# Patient Record
Sex: Female | Born: 1935 | Race: White | Hispanic: No | Smoking: Former smoker
Health system: Southern US, Community
[De-identification: ages and names within clinical notes are randomized; demographics above are authoritative.]

## PROBLEM LIST (undated history)

## (undated) DIAGNOSIS — F419 Anxiety disorder, unspecified: Secondary | ICD-10-CM

## (undated) DIAGNOSIS — I10 Essential (primary) hypertension: Secondary | ICD-10-CM

## (undated) DIAGNOSIS — E039 Hypothyroidism, unspecified: Secondary | ICD-10-CM

## (undated) DIAGNOSIS — T07XXXA Unspecified multiple injuries, initial encounter: Secondary | ICD-10-CM

## (undated) DIAGNOSIS — K5792 Diverticulitis of intestine, part unspecified, without perforation or abscess without bleeding: Secondary | ICD-10-CM

## (undated) DIAGNOSIS — T148XXA Other injury of unspecified body region, initial encounter: Secondary | ICD-10-CM

## (undated) DIAGNOSIS — R110 Nausea: Secondary | ICD-10-CM

## (undated) DIAGNOSIS — G8929 Other chronic pain: Secondary | ICD-10-CM

## (undated) DIAGNOSIS — J449 Chronic obstructive pulmonary disease, unspecified: Secondary | ICD-10-CM

## (undated) DIAGNOSIS — M549 Dorsalgia, unspecified: Secondary | ICD-10-CM

## (undated) DIAGNOSIS — C801 Malignant (primary) neoplasm, unspecified: Secondary | ICD-10-CM

## (undated) DIAGNOSIS — C3490 Malignant neoplasm of unspecified part of unspecified bronchus or lung: Secondary | ICD-10-CM

## (undated) DIAGNOSIS — R51 Headache: Secondary | ICD-10-CM

## (undated) DIAGNOSIS — J45909 Unspecified asthma, uncomplicated: Secondary | ICD-10-CM

## (undated) HISTORY — DX: Malignant neoplasm of unspecified part of unspecified bronchus or lung: C34.90

## (undated) HISTORY — DX: Diverticulitis of intestine, part unspecified, without perforation or abscess without bleeding: K57.92

## (undated) HISTORY — DX: Other chronic pain: G89.29

## (undated) HISTORY — PX: ABDOMINAL HYSTERECTOMY: SHX81

## (undated) HISTORY — DX: Dorsalgia, unspecified: M54.9

---

## 1999-03-30 ENCOUNTER — Ambulatory Visit (HOSPITAL_COMMUNITY): Admission: RE | Admit: 1999-03-30 | Discharge: 1999-03-30 | Payer: Self-pay | Admitting: Orthopedic Surgery

## 1999-03-30 ENCOUNTER — Encounter: Payer: Self-pay | Admitting: Orthopedic Surgery

## 1999-06-19 ENCOUNTER — Emergency Department (HOSPITAL_COMMUNITY): Admission: EM | Admit: 1999-06-19 | Discharge: 1999-06-19 | Payer: Self-pay | Admitting: *Deleted

## 1999-07-15 ENCOUNTER — Ambulatory Visit (HOSPITAL_COMMUNITY): Admission: RE | Admit: 1999-07-15 | Discharge: 1999-07-15 | Payer: Self-pay | Admitting: Sports Medicine

## 1999-07-15 ENCOUNTER — Encounter: Payer: Self-pay | Admitting: Sports Medicine

## 1999-07-16 ENCOUNTER — Encounter: Payer: Self-pay | Admitting: Diagnostic Radiology

## 1999-07-16 ENCOUNTER — Ambulatory Visit (HOSPITAL_COMMUNITY): Admission: RE | Admit: 1999-07-16 | Discharge: 1999-07-16 | Payer: Self-pay | Admitting: Sports Medicine

## 1999-10-06 ENCOUNTER — Ambulatory Visit: Admission: RE | Admit: 1999-10-06 | Discharge: 1999-10-06 | Payer: Self-pay | Admitting: *Deleted

## 2000-06-29 ENCOUNTER — Encounter: Payer: Self-pay | Admitting: Internal Medicine

## 2000-06-29 ENCOUNTER — Encounter: Admission: RE | Admit: 2000-06-29 | Discharge: 2000-06-29 | Payer: Self-pay | Admitting: Internal Medicine

## 2000-10-28 ENCOUNTER — Ambulatory Visit (HOSPITAL_COMMUNITY): Admission: RE | Admit: 2000-10-28 | Discharge: 2000-10-28 | Payer: Self-pay | Admitting: Specialist

## 2000-10-28 ENCOUNTER — Encounter: Payer: Self-pay | Admitting: Specialist

## 2001-09-17 ENCOUNTER — Encounter: Admission: RE | Admit: 2001-09-17 | Discharge: 2001-12-16 | Payer: Self-pay | Admitting: Internal Medicine

## 2002-03-30 ENCOUNTER — Emergency Department (HOSPITAL_COMMUNITY): Admission: EM | Admit: 2002-03-30 | Discharge: 2002-03-30 | Payer: Self-pay | Admitting: Emergency Medicine

## 2002-03-30 ENCOUNTER — Encounter: Payer: Self-pay | Admitting: Emergency Medicine

## 2002-03-30 ENCOUNTER — Encounter: Payer: Self-pay | Admitting: Orthopedic Surgery

## 2004-04-17 ENCOUNTER — Emergency Department (HOSPITAL_COMMUNITY): Admission: EM | Admit: 2004-04-17 | Discharge: 2004-04-17 | Payer: Self-pay | Admitting: Emergency Medicine

## 2005-10-11 ENCOUNTER — Inpatient Hospital Stay (HOSPITAL_COMMUNITY): Admission: RE | Admit: 2005-10-11 | Discharge: 2005-10-15 | Payer: Self-pay | Admitting: Orthopedic Surgery

## 2005-10-24 ENCOUNTER — Ambulatory Visit (HOSPITAL_COMMUNITY): Admission: RE | Admit: 2005-10-24 | Discharge: 2005-10-24 | Payer: Self-pay | Admitting: Orthopedic Surgery

## 2005-11-02 ENCOUNTER — Encounter: Admission: RE | Admit: 2005-11-02 | Discharge: 2006-01-31 | Payer: Self-pay | Admitting: Orthopedic Surgery

## 2006-01-09 ENCOUNTER — Ambulatory Visit (HOSPITAL_COMMUNITY): Admission: RE | Admit: 2006-01-09 | Discharge: 2006-01-09 | Payer: Self-pay | Admitting: Family Medicine

## 2006-01-09 ENCOUNTER — Emergency Department (HOSPITAL_COMMUNITY): Admission: EM | Admit: 2006-01-09 | Discharge: 2006-01-09 | Payer: Self-pay | Admitting: Family Medicine

## 2006-02-07 ENCOUNTER — Inpatient Hospital Stay (HOSPITAL_COMMUNITY): Admission: RE | Admit: 2006-02-07 | Discharge: 2006-02-08 | Payer: Self-pay | Admitting: Orthopedic Surgery

## 2006-04-19 ENCOUNTER — Encounter: Payer: Self-pay | Admitting: *Deleted

## 2006-09-24 ENCOUNTER — Encounter: Admission: RE | Admit: 2006-09-24 | Discharge: 2006-09-24 | Payer: Self-pay

## 2006-10-24 ENCOUNTER — Encounter: Admission: RE | Admit: 2006-10-24 | Discharge: 2006-10-24 | Payer: Self-pay

## 2006-11-20 ENCOUNTER — Encounter: Admission: RE | Admit: 2006-11-20 | Discharge: 2006-11-20 | Payer: Self-pay

## 2006-12-07 ENCOUNTER — Encounter: Admission: RE | Admit: 2006-12-07 | Discharge: 2006-12-07 | Payer: Self-pay

## 2007-05-22 ENCOUNTER — Encounter: Admission: RE | Admit: 2007-05-22 | Discharge: 2007-05-22 | Payer: Self-pay | Admitting: Orthopedic Surgery

## 2007-06-11 ENCOUNTER — Encounter: Admission: RE | Admit: 2007-06-11 | Discharge: 2007-06-11 | Payer: Self-pay | Admitting: Orthopedic Surgery

## 2007-07-02 ENCOUNTER — Encounter: Admission: RE | Admit: 2007-07-02 | Discharge: 2007-07-02 | Payer: Self-pay | Admitting: Orthopedic Surgery

## 2007-07-10 ENCOUNTER — Emergency Department (HOSPITAL_COMMUNITY): Admission: EM | Admit: 2007-07-10 | Discharge: 2007-07-10 | Payer: Self-pay | Admitting: Emergency Medicine

## 2007-09-08 ENCOUNTER — Encounter: Admission: RE | Admit: 2007-09-08 | Discharge: 2007-09-08 | Payer: Self-pay | Admitting: Orthopedic Surgery

## 2007-09-10 ENCOUNTER — Encounter: Admission: RE | Admit: 2007-09-10 | Discharge: 2007-09-10 | Payer: Self-pay | Admitting: Orthopedic Surgery

## 2007-09-20 ENCOUNTER — Ambulatory Visit (HOSPITAL_COMMUNITY): Admission: RE | Admit: 2007-09-20 | Discharge: 2007-09-20 | Payer: Self-pay | Admitting: Radiology

## 2007-09-27 ENCOUNTER — Encounter: Admission: RE | Admit: 2007-09-27 | Discharge: 2007-09-27 | Payer: Self-pay | Admitting: Interventional Radiology

## 2007-09-27 ENCOUNTER — Encounter: Admission: RE | Admit: 2007-09-27 | Discharge: 2007-09-27 | Payer: Self-pay | Admitting: Radiology

## 2007-12-29 ENCOUNTER — Ambulatory Visit: Payer: Self-pay | Admitting: Critical Care Medicine

## 2007-12-29 ENCOUNTER — Inpatient Hospital Stay (HOSPITAL_COMMUNITY): Admission: EM | Admit: 2007-12-29 | Discharge: 2008-01-02 | Payer: Self-pay | Admitting: Emergency Medicine

## 2008-02-28 ENCOUNTER — Encounter: Admission: RE | Admit: 2008-02-28 | Discharge: 2008-02-28 | Payer: Self-pay | Admitting: Orthopedic Surgery

## 2008-08-23 ENCOUNTER — Observation Stay (HOSPITAL_COMMUNITY): Admission: EM | Admit: 2008-08-23 | Discharge: 2008-08-24 | Payer: Self-pay | Admitting: Emergency Medicine

## 2008-09-29 IMAGING — CR DG CHEST 1V
1 series · 1 of 1 positions shown · non-contrast
Comparison: 12/31/2007

CLINICAL DATA: Altered level of consciousness.

CHEST - 1 VIEW

[view not recorded]
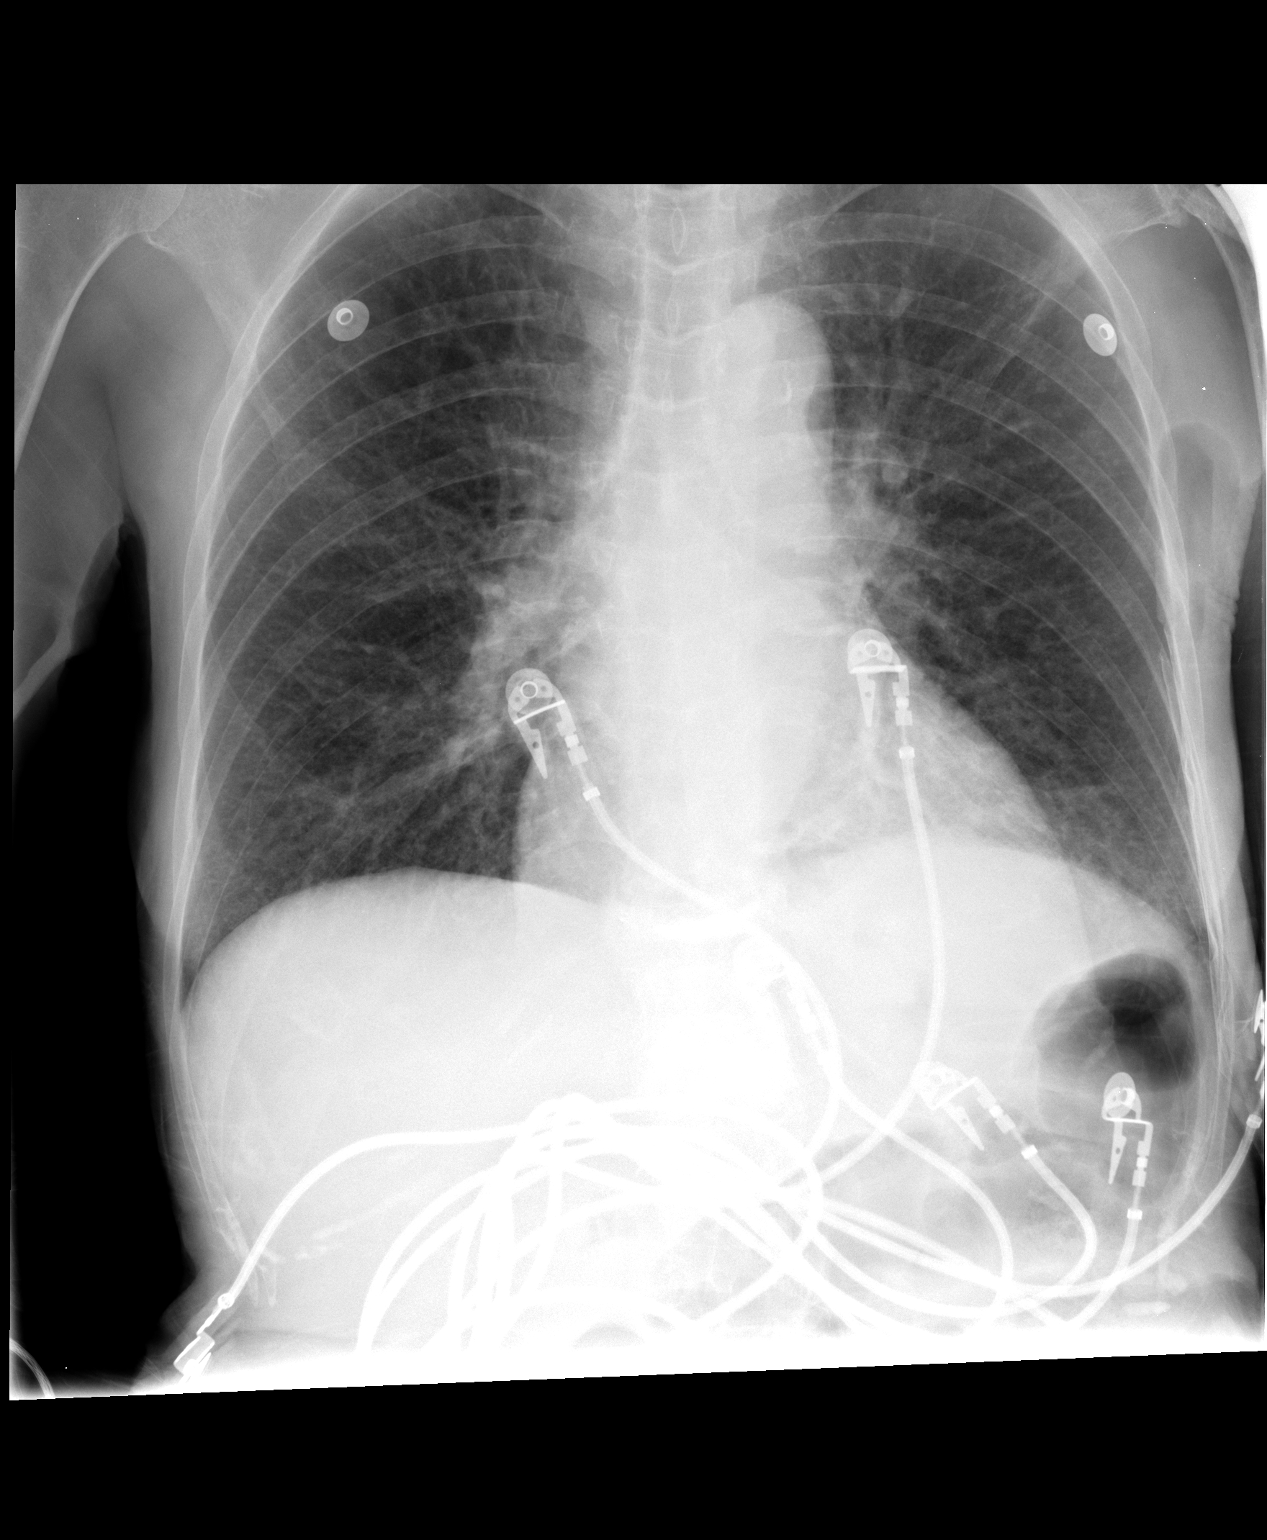

[1 of 1 positions shown; findings below may reference images not displayed]

FINDINGS: Upper limits normal heart size identified.
Mild interstitial prominence is unchanged.
Mild left basilar scarring is again noted.
There is no evidence of focal airspace disease, pleural effusions,
pneumothorax.
A left humeral prosthesis is identified.
Evidence of vertebroplasty again noted.
IMPRESSION: No evidence of acute cardiopulmonary disease.

## 2010-01-04 ENCOUNTER — Encounter: Payer: Self-pay | Admitting: Internal Medicine

## 2010-02-03 ENCOUNTER — Encounter: Payer: Self-pay | Admitting: Internal Medicine

## 2010-02-08 ENCOUNTER — Encounter: Admission: RE | Admit: 2010-02-08 | Discharge: 2010-02-08 | Payer: Self-pay | Admitting: Gastroenterology

## 2010-02-11 ENCOUNTER — Encounter: Payer: Self-pay | Admitting: Internal Medicine

## 2010-02-15 ENCOUNTER — Encounter: Admission: RE | Admit: 2010-02-15 | Discharge: 2010-02-15 | Payer: Self-pay | Admitting: Gastroenterology

## 2010-03-03 ENCOUNTER — Ambulatory Visit (HOSPITAL_COMMUNITY): Admission: RE | Admit: 2010-03-03 | Discharge: 2010-03-03 | Payer: Self-pay | Admitting: Gastroenterology

## 2010-03-16 ENCOUNTER — Encounter: Admission: RE | Admit: 2010-03-16 | Discharge: 2010-03-16 | Payer: Self-pay | Admitting: Gastroenterology

## 2010-03-25 ENCOUNTER — Ambulatory Visit: Payer: Self-pay | Admitting: Internal Medicine

## 2010-03-25 DIAGNOSIS — J45909 Unspecified asthma, uncomplicated: Secondary | ICD-10-CM | POA: Insufficient documentation

## 2010-03-25 DIAGNOSIS — J984 Other disorders of lung: Secondary | ICD-10-CM

## 2010-03-25 DIAGNOSIS — J441 Chronic obstructive pulmonary disease with (acute) exacerbation: Secondary | ICD-10-CM

## 2010-03-25 DIAGNOSIS — I10 Essential (primary) hypertension: Secondary | ICD-10-CM | POA: Insufficient documentation

## 2010-03-25 DIAGNOSIS — E785 Hyperlipidemia, unspecified: Secondary | ICD-10-CM

## 2010-03-25 DIAGNOSIS — F17200 Nicotine dependence, unspecified, uncomplicated: Secondary | ICD-10-CM

## 2010-03-25 DIAGNOSIS — R079 Chest pain, unspecified: Secondary | ICD-10-CM | POA: Insufficient documentation

## 2010-03-25 DIAGNOSIS — J4489 Other specified chronic obstructive pulmonary disease: Secondary | ICD-10-CM | POA: Insufficient documentation

## 2010-03-25 DIAGNOSIS — M1990 Unspecified osteoarthritis, unspecified site: Secondary | ICD-10-CM | POA: Insufficient documentation

## 2010-03-25 DIAGNOSIS — J309 Allergic rhinitis, unspecified: Secondary | ICD-10-CM | POA: Insufficient documentation

## 2010-03-25 DIAGNOSIS — F329 Major depressive disorder, single episode, unspecified: Secondary | ICD-10-CM

## 2010-03-25 DIAGNOSIS — J449 Chronic obstructive pulmonary disease, unspecified: Secondary | ICD-10-CM

## 2010-03-26 ENCOUNTER — Emergency Department (HOSPITAL_COMMUNITY): Admission: EM | Admit: 2010-03-26 | Discharge: 2010-03-26 | Payer: Self-pay | Admitting: Emergency Medicine

## 2010-04-02 ENCOUNTER — Telehealth: Payer: Self-pay | Admitting: Internal Medicine

## 2010-04-07 ENCOUNTER — Telehealth (INDEPENDENT_AMBULATORY_CARE_PROVIDER_SITE_OTHER): Payer: Self-pay | Admitting: *Deleted

## 2010-04-12 ENCOUNTER — Ambulatory Visit (HOSPITAL_COMMUNITY): Admission: RE | Admit: 2010-04-12 | Discharge: 2010-04-12 | Payer: Self-pay | Admitting: Internal Medicine

## 2010-04-19 ENCOUNTER — Telehealth: Payer: Self-pay | Admitting: Internal Medicine

## 2010-04-27 ENCOUNTER — Ambulatory Visit: Payer: Self-pay | Admitting: Internal Medicine

## 2010-04-27 DIAGNOSIS — E049 Nontoxic goiter, unspecified: Secondary | ICD-10-CM | POA: Insufficient documentation

## 2010-04-27 DIAGNOSIS — M810 Age-related osteoporosis without current pathological fracture: Secondary | ICD-10-CM | POA: Insufficient documentation

## 2010-05-02 IMAGING — CR DG CHEST 2V
2 series · 2 of 2 positions shown · non-contrast
Comparison: Chest CT 03/16/2010

CLINICAL DATA: Shortness of breath.  The patient heard pop in chest
today near sternum.  Diagnosed 1 week ago with pneumonia.  Pain.

CHEST - 2 VIEW

[w chest pa]
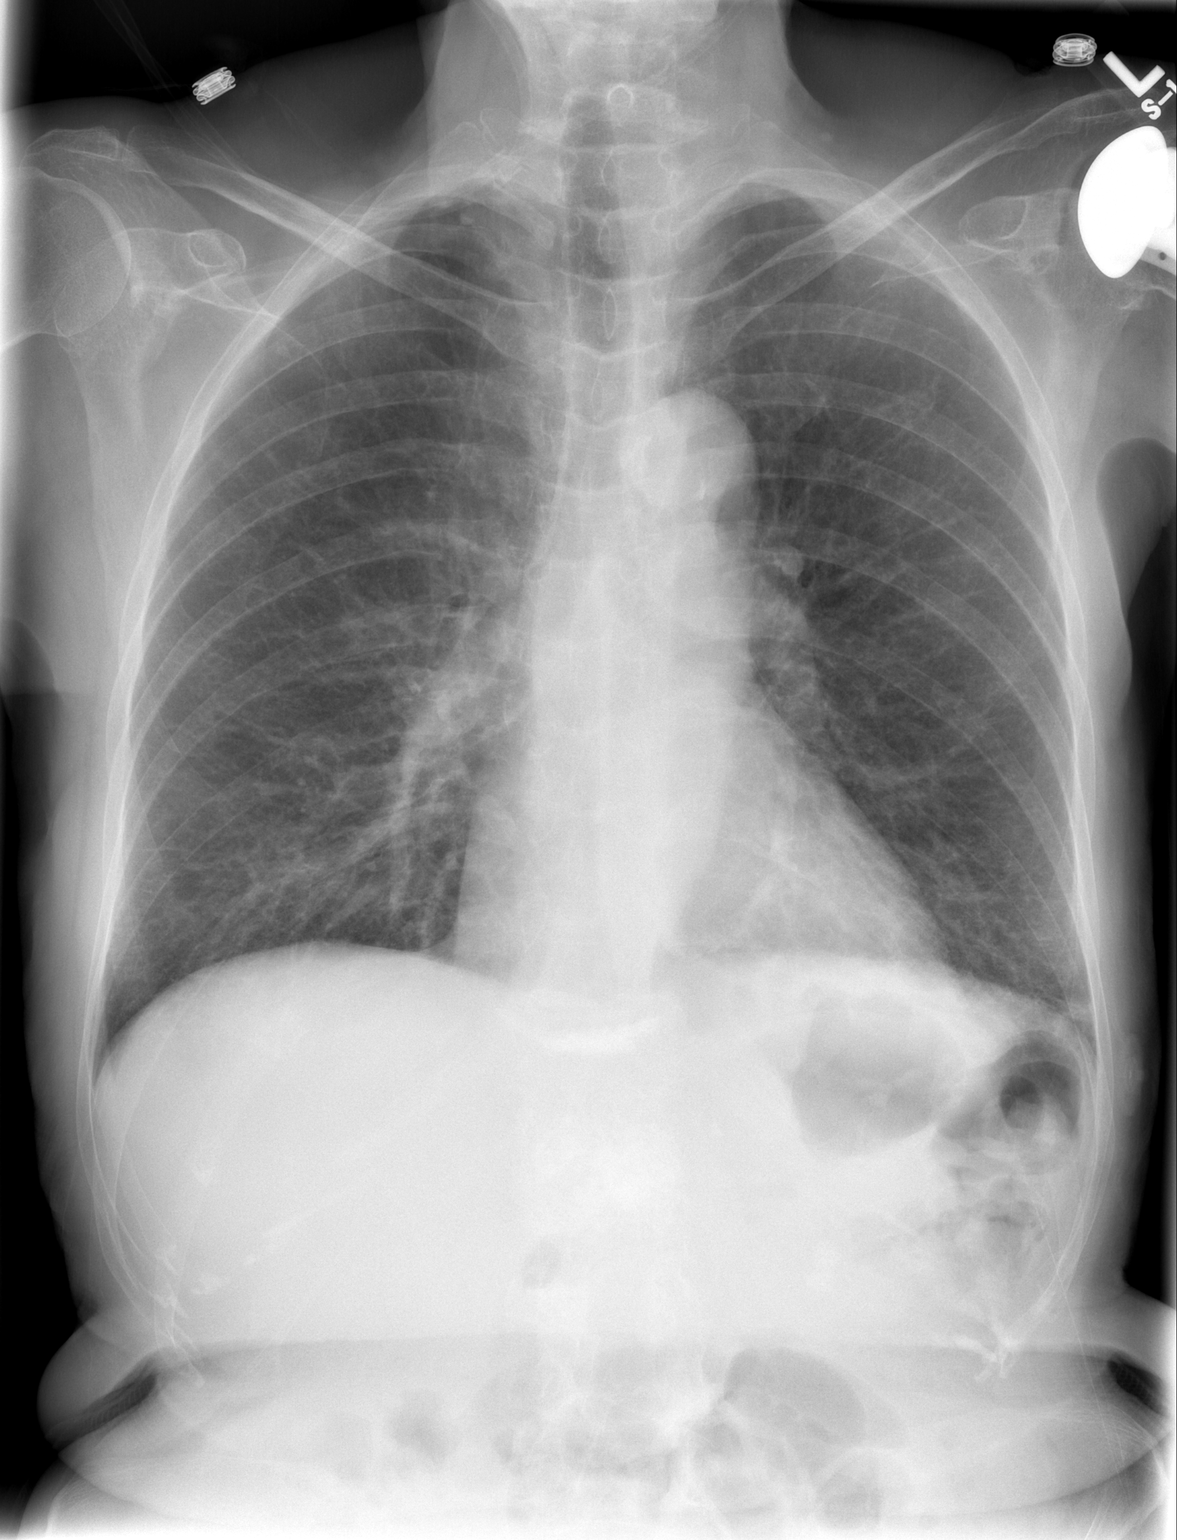

[w chest lat]
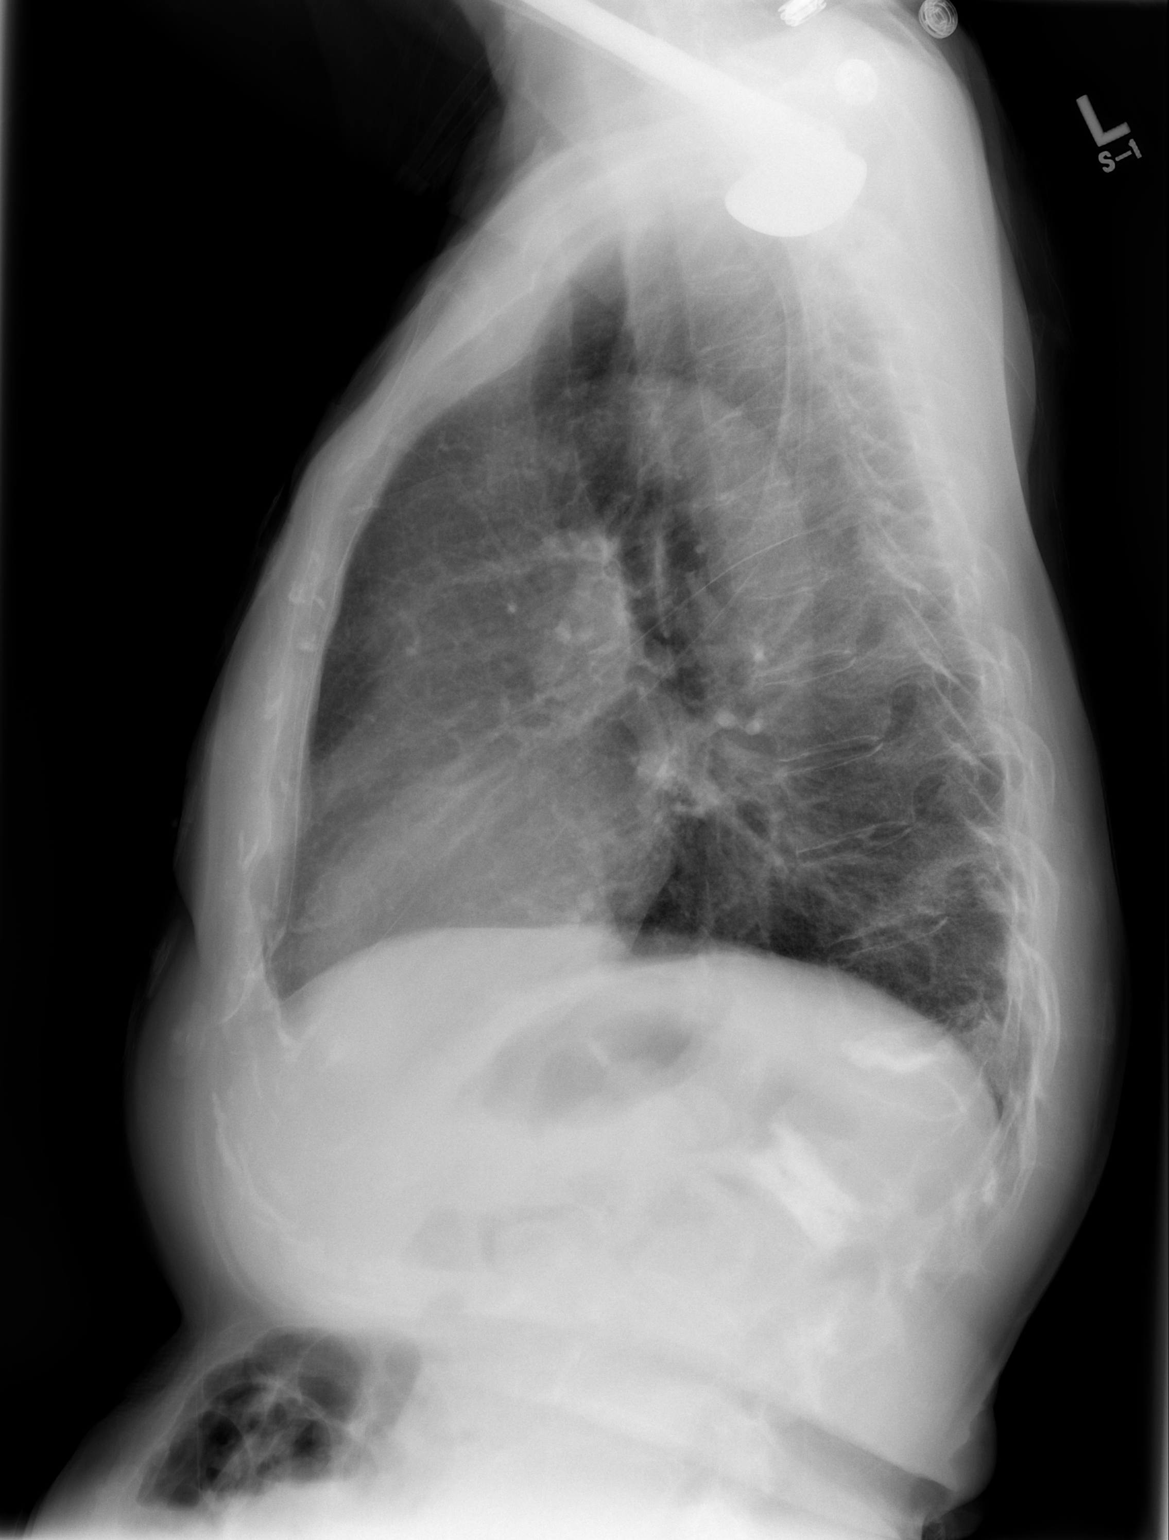

[2 of 2 positions shown; findings below may reference images not displayed]

FINDINGS: Heart size is normal.  There are perihilar bronchitic
changes.  Ground-glass nodules seen on recent CT is not as well
seen on plain films.  There is minimal patchy density seen at the
left lung base however.  Patient has had numerous prior
vertebroplasties.

On the lateral view, there is a lucency traversing the upper
sternal body.  A nondisplaced sternal fracture cannot be entirely
excluded.  Patient has had prior left shoulder arthroplasty.
IMPRESSION: 1.  Minimal patchy density at the left lung base.  The patient has
ground-glass nodules within the left lung base on recent CT.
Further evaluation has been recommended.
2.  Cannot entirely exclude a nondisplaced fracture.  See above.

## 2010-06-30 ENCOUNTER — Telehealth (INDEPENDENT_AMBULATORY_CARE_PROVIDER_SITE_OTHER): Payer: Self-pay | Admitting: *Deleted

## 2010-07-19 ENCOUNTER — Ambulatory Visit: Payer: Self-pay | Admitting: Internal Medicine

## 2010-08-03 ENCOUNTER — Ambulatory Visit: Payer: Self-pay | Admitting: Internal Medicine

## 2010-08-05 LAB — CONVERTED CEMR LAB: Vit D, 25-Hydroxy: 29 ng/mL — ABNORMAL LOW (ref 30–89)

## 2010-08-13 ENCOUNTER — Telehealth: Payer: Self-pay | Admitting: Internal Medicine

## 2010-08-20 ENCOUNTER — Ambulatory Visit: Payer: Self-pay | Admitting: Emergency Medicine

## 2010-08-20 ENCOUNTER — Observation Stay (HOSPITAL_COMMUNITY): Admission: RE | Admit: 2010-08-20 | Discharge: 2010-08-21 | Payer: Self-pay | Admitting: Emergency Medicine

## 2010-08-20 ENCOUNTER — Encounter (INDEPENDENT_AMBULATORY_CARE_PROVIDER_SITE_OTHER): Payer: Self-pay | Admitting: Pulmonary Disease

## 2010-08-24 ENCOUNTER — Ambulatory Visit: Payer: Self-pay | Admitting: Internal Medicine

## 2010-08-24 DIAGNOSIS — C3490 Malignant neoplasm of unspecified part of unspecified bronchus or lung: Secondary | ICD-10-CM | POA: Insufficient documentation

## 2010-08-25 ENCOUNTER — Telehealth: Payer: Self-pay | Admitting: Internal Medicine

## 2010-08-25 ENCOUNTER — Ambulatory Visit: Payer: Self-pay | Admitting: Internal Medicine

## 2010-08-26 ENCOUNTER — Encounter: Payer: Self-pay | Admitting: Internal Medicine

## 2010-08-31 ENCOUNTER — Telehealth (INDEPENDENT_AMBULATORY_CARE_PROVIDER_SITE_OTHER): Payer: Self-pay | Admitting: *Deleted

## 2010-09-01 ENCOUNTER — Ambulatory Visit (HOSPITAL_COMMUNITY): Admission: RE | Admit: 2010-09-01 | Discharge: 2010-09-01 | Payer: Self-pay | Admitting: Emergency Medicine

## 2010-09-08 ENCOUNTER — Ambulatory Visit
Admission: RE | Admit: 2010-09-08 | Discharge: 2010-09-30 | Payer: Self-pay | Source: Home / Self Care | Admitting: Radiation Oncology

## 2010-09-29 ENCOUNTER — Encounter: Payer: Self-pay | Admitting: Internal Medicine

## 2010-10-07 IMAGING — CR DG CHEST 2V
2 series · 2 of 2 positions shown · non-contrast
Comparison: Chest x-ray 08/20/2010, chest c t 07/19/2010

CLINICAL DATA: Lung cancer.  Pre admit for O R 09/01/2010.  History
of asthma, emphysema, COPD, smoking.  History of hypertension.

CHEST - 2 VIEW

[view not recorded (1 of 2)]
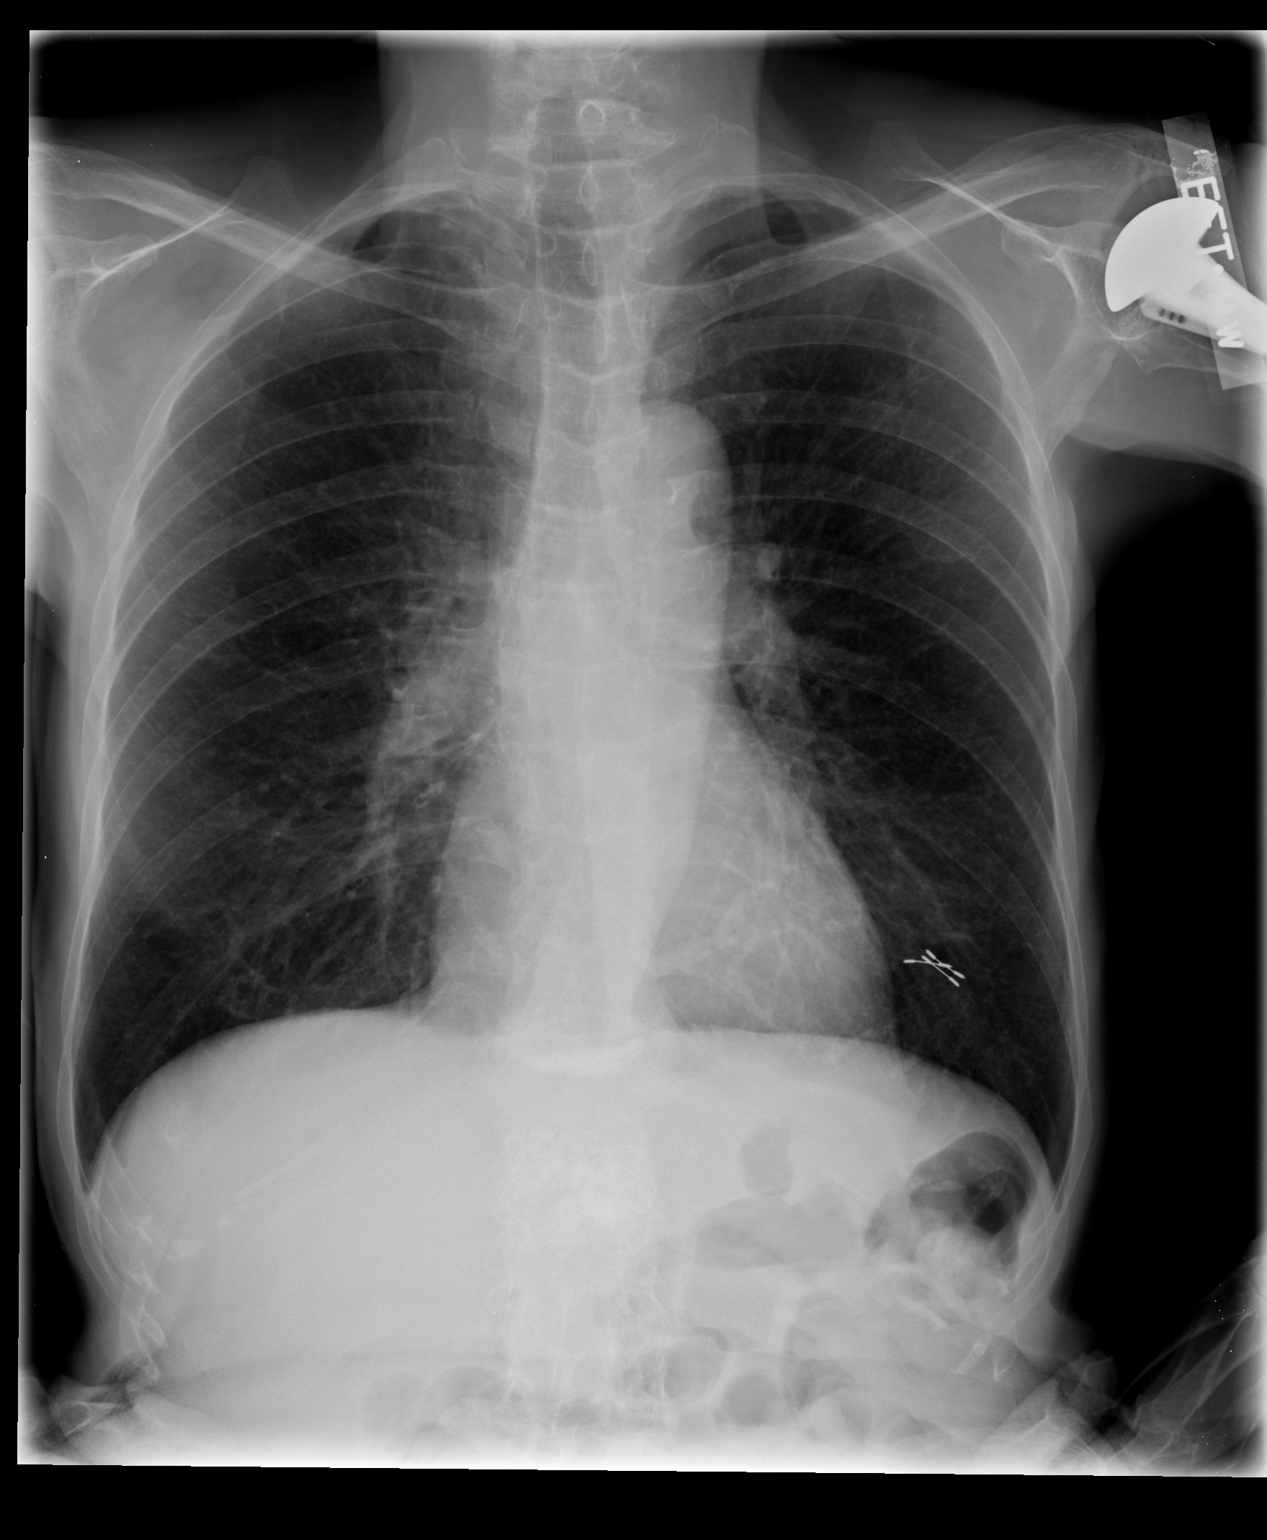

[view not recorded (2 of 2)]
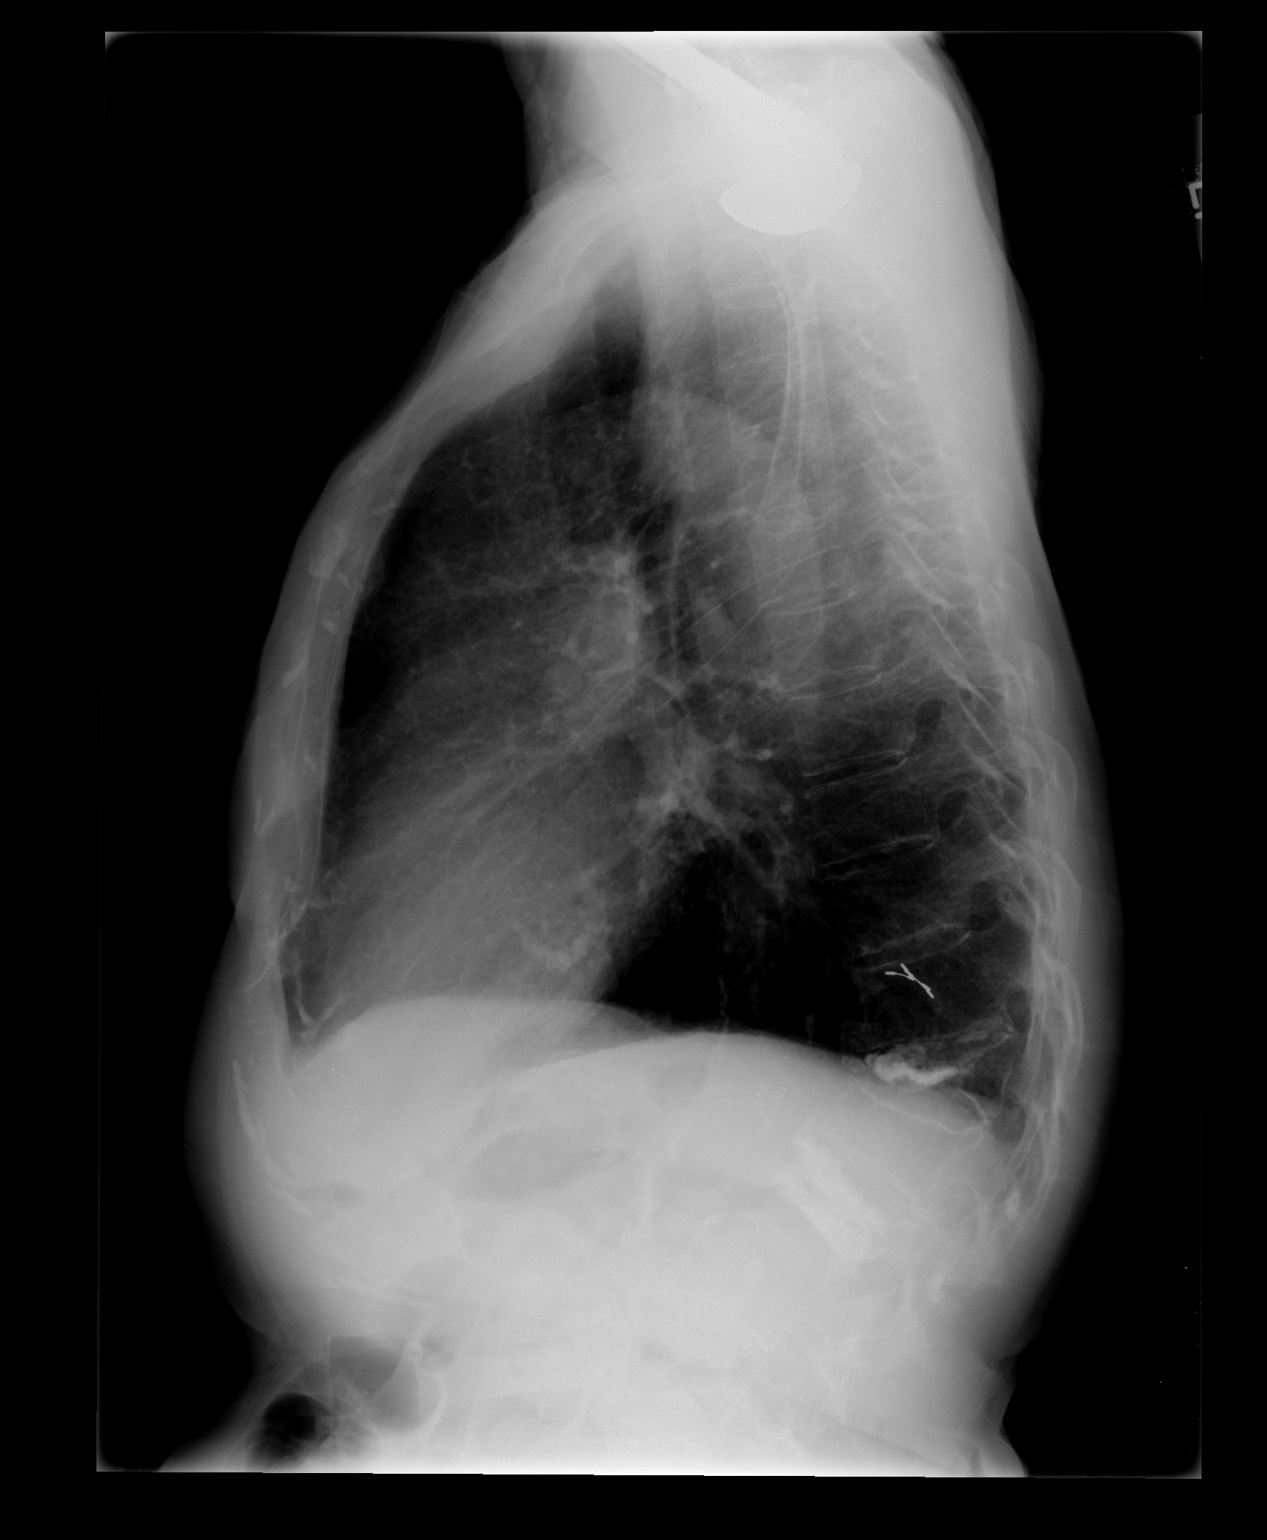

[2 of 2 positions shown; findings below may reference images not displayed]

FINDINGS: Heart size is normal.  The lungs are free of focal
consolidations and pleural effusions.  There are perihilar
bronchitic changes.  Lungs are hyperinflated as before.  Overlying
the posterior left lower lobe, there are three small radiopaque
markers.  Patient has had prior 2 level vertebral plasty in the
lower thoracic and lumbar spine. No evidence for pneumothorax or
pulmonary edema.  No new consolidation. Prior left shoulder
arthroplasty.
IMPRESSION: COPD and bronchitic changes.  No acute findings.

## 2010-11-05 ENCOUNTER — Encounter: Payer: Self-pay | Admitting: Internal Medicine

## 2011-01-06 LAB — CREATININE, SERUM: Creatinine, Ser: 0.6 mg/dL (ref 0.40–1.20)

## 2011-01-06 LAB — BUN: BUN: 14 mg/dL (ref 6–23)

## 2011-01-07 ENCOUNTER — Ambulatory Visit (HOSPITAL_COMMUNITY)
Admission: RE | Admit: 2011-01-07 | Discharge: 2011-01-07 | Payer: Self-pay | Source: Home / Self Care | Attending: Radiation Oncology | Admitting: Radiation Oncology

## 2011-01-09 ENCOUNTER — Encounter: Payer: Self-pay | Admitting: Radiology

## 2011-01-09 ENCOUNTER — Encounter: Payer: Self-pay | Admitting: Gastroenterology

## 2011-01-14 ENCOUNTER — Encounter: Payer: Self-pay | Admitting: Internal Medicine

## 2011-01-14 ENCOUNTER — Other Ambulatory Visit: Payer: Self-pay | Admitting: Radiation Oncology

## 2011-01-14 DIAGNOSIS — C3490 Malignant neoplasm of unspecified part of unspecified bronchus or lung: Secondary | ICD-10-CM

## 2011-01-18 NOTE — Assessment & Plan Note (Signed)
Summary: per MR follow up with spiro/MS   Visit Type:  Follow-up Copy to:  Dr. Evette Cristal Primary Provider/Referring Provider:  Dr. Creta Levin, [place] and Dr. Wandalee Ferdinand [facility]  CC:  Pt here for 3 month follow-up. Pt denies any new complaints. States breathing is well. Marland Kitchen  History of Present Illness: 77 year old wiht severe osteoporosis, idiopathic cyclical nausea/vomitting. Pulmonary issues include Smoking, COPD NOS,  and pulmonary nodules LUL and LLL  on CT 03/16/2010   OV 08/03/2010: Followup for her pumonary issues. Since lsat visit in May 2011, she is having  diverticulitis flare currently and is on antibiotics. Therefore, has had significant weight loss. From pulmonary standpoint, she is still smoking but trying to quit. Plans to get nictoine patch tomorrow. COPD is stable and is compliant with spiriva. Spirometry done today shows Gold stage 2 COPD. In terms of nodule, she had CT chest earlier this month and nodules are stable  Preventive Screening-Counseling & Management  Alcohol-Tobacco     Smoking Status: current     Smoking Cessation Counseling: yes     Smoke Cessation Stage: ready     Tobacco Counseling: to quit use of tobacco products  Current Medications (verified): 1)  Spiriva Handihaler 18 Mcg Caps (Tiotropium Bromide Monohydrate) .... Inhale One Capsule Once Daily 2)  Proair Hfa 108 (90 Base) Mcg/act Aers (Albuterol Sulfate) .... As Needed 3)  Levothroid 50 Mcg Tabs (Levothyroxine Sodium) .... Take 1 Tablet By Mouth Once A Day 4)  Bisoprolol Fumarate 5 Mg Tabs (Bisoprolol Fumarate) .... Take One and Half Tablets Once Daily 5)  Zoloft 50 Mg Tabs (Sertraline Hcl) .... Take 1 Tablet By Mouth Once A Day 6)  Amlodipine Besylate 10 Mg Tabs (Amlodipine Besylate) .... Take 1 Tablet By Mouth Once A Day 7)  Lorazepam 0.5 Mg Tabs (Lorazepam) .... Take 1 Hour Before Pet Scan  Allergies (verified): 1)  ! Asa  Past History:  Past medical, surgical, family and social  histories (including risk factors) reviewed, and no changes noted (except as noted below).  Past Medical History: Reviewed history from 04/27/2010 and no changes required. Allergic Rhinitis Asthma/COPD NOS Hypertension Hyperlipidemia Depression Osteoarthritis SEvere osteoporosis Benign appearing adrenal adenoma - MRI ABD 02/15/2010 and MR 2009 unchanged and PET 04/12/2010 Altered mental status secondary to narcotics overdose      (unintentional). - Sept 2009 Chronic pain secondary to osteoporosis with compression  deformities Bilateral nephrolithiasis. Largest calculus is in the lower   pole of the left kidney measuring  1 cm  - CT 2/21/211 Severe kyphosis at T12..  Labs 02/03/10 by Dr. Evette Cristal: Creatinine-0.60, Hgb-15.0, HCT-45.8, BUN-12, AST-16, ALT-10  Past Surgical History: Reviewed history from 03/25/2010 and no changes required. Vertebroplasty x2 Sinus surgery Total Abdominal Hysterectomy Total Knee Arthroplasty Shoulder Surgery 2007 -  Avascular necrosis, left humeral head, with large loose body, status      post left shoulder hemiarthroplasty.  Stent placed I believe in the renal artery in 2002 by Dr. Madilyn Fireman.  Nasal surgery 1982.  Four surgeries on her right ear. 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  Lumbar vertebroplasty July 2000.  Family History: Reviewed history from 03/25/2010 and no changes required. Mother- heart disease, lung cancer, smoker Aunt-several had cancer and heart attacks  Social History: Reviewed history from 03/25/2010 and no changes required. Patient is a current smoker.  Pt started smoking  in 1961 avg 1/2 ppd.  Widowed Retired  Review of Systems  The patient denies shortness of breath  with activity, shortness of breath at rest, productive cough, non-productive cough, coughing up blood, chest pain, irregular heartbeats, acid heartburn, indigestion, loss of appetite, weight change, abdominal pain, difficulty swallowing, sore throat, tooth/dental  problems, headaches, nasal congestion/difficulty breathing through nose, sneezing, itching, ear ache, anxiety, depression, hand/feet swelling, joint stiffness or pain, rash, change in color of mucus, and fever.    Vital Signs:  Patient profile:   77 year old female Height:      60 inches Weight:      92.4 pounds O2 Sat:      98 % on Room air Temp:     97.8 degrees F oral Pulse rate:   63 / minute BP sitting:   102 / 70  (left arm) Cuff size:   large  Vitals Entered By: Carron Curie CMA (August 03, 2010 11:54 AM)  O2 Flow:  Room air CC: Pt here for 3 month follow-up. Pt denies any new complaints. States breathing is well.  Comments Medications reviewed with patient Carron Curie CMA  August 03, 2010 11:57 AM Daytime phone number verified with patient.    Physical Exam  General:  well developed, well nourished, in no acute distresscachectic.   Head:  normocephalic and atraumatic Eyes:  PERRLA/EOM intact; conjunctiva and sclera clear Ears:  TMs intact and clear with normal canals Nose:  no deformity, discharge, inflammation, or lesions Mouth:  no deformity or lesions Neck:  no masses, thyromegaly, or abnormal cervical nodes Chest Wall:  no deformities noted Lungs:  clear bilaterally to auscultation and percussion Heart:  regular rate and rhythm, S1, S2 without murmurs, rubs, gallops, or clicks Abdomen:  bowel sounds positive; abdomen soft and non-tender without masses, or organomegaly Msk:  no deformity or scoliosis noted with normal posture Pulses:  pulses normal Extremities:  no clubbing, cyanosis, edema, or deformity noted Neurologic:  CN II-XII grossly intact with normal reflexes, coordination, muscle strength and tone Skin:  intact without lesions or rashes Cervical Nodes:  no significant adenopathy Axillary Nodes:  no significant adenopathy Psych:  alert and cooperative; normal mood and affect; normal attention span and concentration   CT of  Chest  Procedure date:  07/19/2010  Findings:      IMPRESSION:   1.  Left upper lobe and left lower lobe ground-glass nodules are stable in size.  Findings remain highly worrisome for indolent bronchioloalveolar carcinoma. 2.  Peripheral and upper lobe predominant areas of ground-glass. Nonspecific interstitial pneumonia (NSIP) is considered. 3.  Right adrenal adenoma. 4.  Nonobstructing bilateral nephrolithiasis.   Read By:  Reyes Ivan.,  M.D.     Released By:  Reyes Ivan.,  M.D.  Comments:      independently reviwed  Pulmonary Function Test Date: 08/03/2010 11:57 AM Gender: Female  Pre-Spirometry FVC    Value: 2.04 L/min   % Pred: 88 % FEV1    Value: 1.33 L     Pred: 1.73 L     % Pred: 77.20 % FEV1/FVC  Value: 65.35 %     % Pred: 87.30 %  Evaluation: moderate obstruction with NO significant bronchodilator response  Impression & Recommendations:  Problem # 1:  CHEST PAIN (ICD-786.50) Assessment Improved resolved. Was related to spontaneous sternal frcture  plan expectant folllowup  Problem # 2:  C O P D (ICD-496) Assessment: Unchanged Spirometry shows Gold stage 2 COPD. Currently stable. Advised to continue spiriva 1  puff daily. Will check vitamin D level today. Next visit check Alpha 1 antitrypsin  Problem # 3:  SMOKER (ICD-305.1) Assessment: Improved  still smoking but cutting down. She plans to start OTC patch 08/04/2010 and quit by herself. Counselled for 3 minutes  Orders: Est. Patient Level IV (78295) Tobacco use cessation intermediate 3-10 minutes (62130)  Problem # 4:  PULMONARY NODULE (ICD-518.89) Assessment: Unchanged  PET scan 04/12/2010  LUL nodule SUV 2.6 on im# 67 and LLL nodule is SUV 3.1 (Smoker +. Onciummne lung cancer antigen test negative) CT Chest 07/19/2010 - no change in nodules  We again discussd serial monitoring versus biopsy via CT guided versus Electromagnetic approach. THe latter now available at [facility].  Risks of  both procedures explained. Limitations explained. She appears to be more open this visit to ENB bronch. I will discuss at [facility] on 08/05/2010 and get back to her.   Orders: Est. Patient Level IV (86578) Tobacco use cessation intermediate 3-10 minutes (46962)  Other Orders: T-Vitamin D (25-Hydroxy) (95284-13244)  Patient Instructions: 1)  i appreicate your effort to quit smoking 2)  continue spiriva for your copd 3)  control your diverticulitis 4)  my nurse will check alpha 1 antitrypsin check at next visit 5)  I will discuss at our conference this thursday about biopsy and get back to you 6)  fu depending on conference discussion    CardioPerfect Spirometry  ID: [id number] Patient: [name], [name] DOB: 1932/03/11 Age: 77 Years Old Sex: Female Race: White Height: 60 Weight: 92.4 Status: Unconfirmed Past Medical History:  Allergic Rhinitis Asthma/COPD NOS Hypertension Hyperlipidemia Depression Osteoarthritis SEvere osteoporosis Benign appearing adrenal adenoma - MRI ABD 02/15/2010 and MR 2009 unchanged and PET 04/12/2010 Altered mental status secondary to narcotics overdose      (unintentional). - Sept 2009 Chronic pain secondary to osteoporosis with compression  deformities Bilateral nephrolithiasis. Largest calculus is in the lower   pole of the left kidney measuring  1 cm  - CT 2/21/211 Severe kyphosis at T12..  Labs 02/03/10 by Dr. Evette Cristal: Creatinine-0.60, Hgb-15.0, HCT-45.8, BUN-12, AST-16, ALT-10 Recorded: 08/03/2010 11:57 AM  Parameter  Measured Predicted %Predicted FVC     2.04        2.32        88 FEV1     1.33        1.73        77.20 FEV1%   65.35        74.87        87.30 PEF    3.41        4.63        73.60   Interpretation:

## 2011-01-18 NOTE — Letter (Signed)
Summary: [facility]  [facility]   Imported By: Sherian Rein 11/03/2010 11:27:07  _____________________________________________________________________  External Attachment:    Type:   Image     Comment:   External Document

## 2011-01-18 NOTE — Progress Notes (Signed)
Summary: chest pain - should see [place] cards asap  Phone Note Call from Patient Call back at Home Phone [phone]   Caller: Patient Call For: ramaswamy Reason for Call: Talk to Nurse Summary of Call: pt says pain in chest area almost unbearable.  Wants to know if she can get CT sooner and see MR sooner? Initial call taken by: Eugene Gavia,  June 30, 2010 8:57 AM  Follow-up for Phone Call        Pt reports pain in left chest that radiates around to left shoulder blade has worsened.  Pt is scheduled for chest ct on 07-19-10.  Is this soon enough or should we try to get it sooner?  Please advise. Abigail Miyamoto RN  June 30, 2010 9:11 AM   Additional Follow-up for Phone Call Additional follow up Details #1::        spoke to patient. LEft precordial pain in afternoons but happens only with exertion and relieved by rest and heating pad. Also radiates to left shoulder. NEw for 3 weeks but not progresive. Currently pain free. She needs to see cards asap. If worse, go to ER. I have advised her of that Additional Follow-up by: Kalman Shan MD,  June 30, 2010 5:55 PM  New Problems: CHEST PAIN (ICD-786.50)   Additional Follow-up for Phone Call Additional follow up Details #2::    oder was sent to [facility] for cardiology referral.  Bjorn Loser is currently on the phone with cards setting pt up for an appt.  will sign off on this message.  Aundra Millet Reynolds LPN  July 01, 2010 8:48 AM   New Problems: CHEST PAIN (ICD-786.50)  Appended Document: chest pain - should see [place] cards asap she was supposed to go see Dr. Riley Kill 7/15 for chest pain. What happened?  Appended Document: chest pain - should see [place] cards asap Called and spoke with pt and pt stated that her chest pain ahd stopped and she figured that pain was coming from her cracked breast bone. Pt stated she felt like their was need for her to go and also pt states it would have took forver to be seen bc their were no  close upcoming  apt. Pt states she is doing better and no pain in 2 weeks.   Appended Document: chest pain - should see [place] cards asap ok

## 2011-01-18 NOTE — Progress Notes (Signed)
Summary: pain med  Phone Note Call from Patient Call back at Home Phone [phone]   Caller: Patient Call For: Rashauna Tep Reason for Call: Talk to Nurse Summary of Call: Patient states she fell and fractured her sternum.  She says she is in too much pain to move.   She is asking for pain medication, I told her she might have to get in touch w/ her primary care doctor.  She said she talked to  Dr. Marchelle Gearing yesterday about this. Initial call taken by: Lehman Prom,  April 02, 2010 12:37 PM  Follow-up for Phone Call        When did she fall and fracture sternum? This is a trauma issue. Needs ER or primary care. Fall related chest pain is nit a pulmonary issue event though pulmnary system is in chest. Plseae clarify. If needed page me to give details Follow-up by: Kalman Shan MD,  April 02, 2010 1:12 PM  Additional Follow-up for Phone Call Additional follow up Details #1::        pt advised. she states she will call PMD. Carron Curie CMA  April 02, 2010 1:19 PM

## 2011-01-18 NOTE — Progress Notes (Signed)
Summary: results  Phone Note Call from Patient   Caller: Son Call For: Adonys Wildes Summary of Call: pt's son Malachi Paradise wants results of pt's PET. [phone] Initial call taken by: Tivis Ringer, CNA,  Apr 19, 2010 3:16 PM  Follow-up for Phone Call        Please advise results, thanks Vernie Murders  Apr 19, 2010 3:18 PM   Additional Follow-up for Phone Call Additional follow up Details #1::        spots in lung show moderate uptake. Coin toss if cancer or not. Need to come and see me to discuss. Apr 27, 2010 is fine. There is a sternal fracture too. Dont know what that means. Some findings related to gall bladder. Best if they come face to face to talk. Too complicated over phone.  Additional Follow-up by: Kalman Shan MD,  Apr 20, 2010 2:22 AM    Additional Follow-up for Phone Call Additional follow up Details #2::    LMTCB.Carron Curie CMA  Apr 20, 2010 2:43 PM pt sone advised of recs and he will be at appt with pt on 04-27-10. Carron Curie CMA  Apr 20, 2010 4:57 PM

## 2011-01-18 NOTE — Progress Notes (Signed)
Summary: Ok to redose lorazepam  Phone Note Call from Patient Call back at [facility] Phone [phone]   Caller: Patient Call For: [name] Summary of Call: pt having PET monday. requests a pill for "anxiety" to take before [facility] [place]  Initial call taken by: Tivis Ringer, CNA,  April 07, 2010 9:20 AM  Follow-up for Phone Call        pt just saw MR on 03/25/2010 and was given rx for Lorazepam # 1. Victorino Dike states she called this in to [facility] [place] for pt.    Called [facility] [place] and verified that we did indeed call in the rx for Lorazepam on 03/25/2010 and pt picked it up on 03/26/2010.    Called and spoke with pt and informed her of the above information.  Pt states she was originally scheduled for PET scan on 04/05/2010 and therefore took Lorazepam tablet.  Once she got to [facility] radiology dept she was informed PET scan machine was down and had to reschedule PET scan for 04/12/2010 at 6:45am.    I called and verified with Radiology at [facility] that their PET scan machine was indeed down and pt was rescheduled.    Will forward message to doc of the day to address if ok to send another rx for Lorazepam of pending PET scan.  Aundra Millet Reynolds LPN  April 07, 2010 9:40 AM   ok to redose as before Follow-up by: Nyoka Cowden MD,  April 07, 2010 12:51 PM  Additional Follow-up for Phone Call Additional follow up Details #1::        Spoke with pt and advised that rx called sent in for lorazepam.   Additional Follow-up by: Vernie Murders,  April 07, 2010 2:02 PM    New/Updated Medications: LORAZEPAM 0.5 MG TABS (LORAZEPAM) take 1 hour before PET scan Prescriptions: LORAZEPAM 0.5 MG TABS (LORAZEPAM) take 1 hour before PET scan  #1 x 0   Entered by:   Vernie Murders   Authorized by:   Nyoka Cowden MD   Signed by:   Vernie Murders on 04/07/2010   Method used:   Telephoned to ...       [facility]  [place] [address] (retail)       [address]       [place], [place]  [address]       Ph:  [phone] or [phone]       Fax: [phone]   RxID:   [id number]

## 2011-01-18 NOTE — Assessment & Plan Note (Signed)
Summary: HFU/ MBW   Visit Type:  Follow-up Copy to:  Dr. Evette Cristal Primary Mariavictoria Nottingham/Referring [place]:  Dr. Creta Levin, [place] and Dr. Wandalee Ferdinand [facility]  CC:  HFU from biopsy. Marland Kitchen  History of Present Illness: 77 year old wiht severe osteoporosis, idiopathic cyclical nausea/vomitting. Pulmonary issues include Smoking, COPD NOS,  and pulmonary nodules LUL and LLL  on CT 03/16/2010   OV 08/03/2010: Followup for her pumonary issues. Since lsat visit in May 2011, she is having  diverticulitis flare currently and is on antibiotics. Therefore, has had significant weight loss. From pulmonary standpoint, she is still smoking but trying to quit. Plans to get nictoine patch tomorrow. COPD is stable and is compliant with spiriva. Spirometry done today shows Gold stage 2 COPD. In terms of nodule, she had CT chest earlier this month and nodules are stable. REC: ELECTRONAVIGATIONAL BRONC BX   August 24, 2010: Followup for results of ENB Bronch done 08/20/2010. No interim complaints. Feels well. Here with son. Both LUL and LLL biopsies show adenocarcinoma. The LLL biopsy result was obtained on site in OR and fiducials were placed. The LUL biopsy result was not available on site and therefore no fiducials in LUL. She is awre of diagnosis from postop briefing. Lot of questions now on staging and Rx options and prognosis.    Preventive Screening-Counseling & Management  Alcohol-Tobacco     Smoking Status: current     Packs/Day: 1/2     Year Started: 1961     Pack years: 25  Current Medications (verified): 1)  Spiriva Handihaler 18 Mcg Caps (Tiotropium Bromide Monohydrate) .... Inhale One Capsule Once Daily 2)  Proair Hfa 108 (90 Base) Mcg/act Aers (Albuterol Sulfate) .... As Needed 3)  Levothroid 50 Mcg Tabs (Levothyroxine Sodium) .... Take 1 Tablet By Mouth Once A Day 4)  Bisoprolol Fumarate 5 Mg Tabs (Bisoprolol Fumarate) .... Take One and Half Tablets Once Daily 5)  Amlodipine Besylate 10  Mg Tabs (Amlodipine Besylate) .... Take 1 Tablet By Mouth Once A Day 6)  Lorazepam 0.5 Mg Tabs (Lorazepam) .... Take 1 Hour Before Pet Scan  Allergies (verified): 1)  ! Asa  Past History:  Past medical, surgical, family and social histories (including risk factors) reviewed, and no changes noted (except as noted below).  Past Medical History: Allergic Rhinitis Asthma/COPD NOS  - Fev 1 1.33L/54% Hypertension Hyperlipidemia Depression Osteoarthritis SEvere osteoporosis Benign appearing adrenal adenoma - MRI ABD 02/15/2010 and MR 2009 unchanged and PET 04/12/2010 Altered mental status secondary to narcotics overdose      (unintentional). - Sept 2009 Chronic pain secondary to osteoporosis with compression  deformities Bilateral nephrolithiasis. Largest calculus is in the lower   pole of the left kidney measuring  1 cm  - CT 2/21/211 Severe kyphosis at T12..  Labs 02/03/10 by Dr. Evette Cristal: Creatinine-0.60, Hgb-15.0, HCT-45.8, BUN-12, AST-16, ALT-10  Past Surgical History: Reviewed history from 03/25/2010 and no changes required. Vertebroplasty x2 Sinus surgery Total Abdominal Hysterectomy Total Knee Arthroplasty Shoulder Surgery 2007 -  Avascular necrosis, left humeral head, with large loose body, status      post left shoulder hemiarthroplasty.  Stent placed I believe in the renal artery in 2002 by Dr. Madilyn Fireman.  Nasal surgery 1982.  Four surgeries on her right ear. 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  Lumbar vertebroplasty July 2000.  Family History: Reviewed history from 03/25/2010 and no changes required. Mother- heart disease, lung cancer, smoker Aunt-several had cancer and heart attacks  Social  History: Reviewed history from 03/25/2010 and no changes required. Patient is a current smoker.  Pt started smoking  in 1961 avg 1/2 ppd.  Widowed Retired  Packs/Day:  1/2 Pack years:  25  Review of Systems  The patient denies shortness of breath with activity, shortness of breath  at rest, productive cough, non-productive cough, coughing up blood, chest pain, irregular heartbeats, acid heartburn, indigestion, loss of appetite, weight change, abdominal pain, difficulty swallowing, sore throat, tooth/dental problems, headaches, nasal congestion/difficulty breathing through nose, sneezing, itching, ear ache, anxiety, depression, hand/feet swelling, joint stiffness or pain, rash, change in color of mucus, and fever.    Vital Signs:  Patient profile:   77 year old female Height:      60 inches Weight:      90.8 pounds BMI:     17.80 O2 Sat:      96 % on Room air Temp:     97.8 degrees F oral Pulse rate:   60 / minute BP sitting:   110 / 70  (right arm) Cuff size:   regular  Vitals Entered By: Carron Curie CMA (August 24, 2010 1:34 PM)  O2 Flow:  Room air CC: HFU from biopsy.  Comments Medications reviewed with patient Carron Curie CMA  August 24, 2010 1:41 PM Daytime phone number verified with patient.    Physical Exam  General:  well developed, well nourished, in no acute distresscachectic.   Head:  normocephalic and atraumatic Eyes:  PERRLA/EOM intact; conjunctiva and sclera clear Ears:  TMs intact and clear with normal canals Nose:  no deformity, discharge, inflammation, or lesions Mouth:  no deformity or lesions Neck:  no masses, thyromegaly, or abnormal cervical nodes Chest Wall:  no deformities noted Lungs:  clear bilaterally to auscultation and percussion Heart:  regular rate and rhythm, S1, S2 without murmurs, rubs, gallops, or clicks Abdomen:  bowel sounds positive; abdomen soft and non-tender without masses, or organomegaly Msk:  no deformity or scoliosis noted with normal posture Pulses:  pulses normal Extremities:  no clubbing, cyanosis, edema, or deformity noted Neurologic:  CN II-XII grossly intact with normal reflexes, coordination, muscle strength and tone Skin:  intact without lesions or rashes Cervical Nodes:  no  significant adenopathy Axillary Nodes:  no significant adenopathy Psych:  alert and cooperative; normal mood and affect; normal attention span and concentration   Impression & Recommendations:  Problem # 1:  ADENOCARCINOMA, LEFT LUNG (ICD-162.9) Assessment New  PET scan 04/12/2010  LUL nodule SUV 2.6 on im# 67 and LLL nodule is SUV 3.1 (Smoker +. Onciummne lung cancer antigen test negative).  CT Chest 07/19/2010 - no change in nodules   - ENB BX 08/20/2010 - LUL and LLL - ADENOCA. s/p fiducial placement LLL . ECOG 0 - 1  PLAN  - STage is T4, NO, MO - 3A. Survival for this stage explaiend. She wants to maintain her ECOG as long as possible  - Doubt surgical candidate in view of age, osteoporosis, and stage 3A lesion   - Advised XRT and briefly spoke about benefit and risks. She is willing to accept this and procced  - Discussed potential chemo. Although frail from osteop her ECOG might allow for chemo. SHe is hesitatnt but is ok to meet oncologist  - Appt set up at 15:30 at [facility] on 08/26/2010 thursday  Orders: Est. Patient Level IV (04540)  Problem # 2:  C O P D (ICD-496) Assessment: Comment Only  Spirometry shows Gold stage  2 COPD. Currently stable. Advised to continue spiriva 1  puff daily. y. Next visit check Alpha 1 antitrypsin   Problem # 3:  CHEST PAIN-UNSPECIFIED (ICD-786.50) Assessment: Deteriorated  c/o left infrascapular pain. REfuseing NSAID, tylenol and ultram. Will give percocet  Orders: Est. Patient Level IV (04540)  Medications Added to Medication List This Visit: 1)  Percocet 5-325 Mg Tabs (Oxycodone-acetaminophen) .... Take 1 tablet every six hours as needed only  Other Orders: Flu Vaccine 92yrs + MEDICARE PATIENTS (J8119) Administration Flu vaccine - MCR (G0008) Pneumococcal Vaccine (14782) Admin 1st Vaccine (95621)  Patient Instructions: 1)  #lung ccancer 2)   -  you have stage 3 A adenocarcinoma left lung 3)   - I will set an appt with you with  cancer doctors - plese wait to hear 4)  #COPD 5)   - continue inahlers 6)  #SMOKING 7)   - pleaset try to quit 8)  #HEALTH 9)   - have flu shot and pneumonia vaccine today 10)  #CHEST PAIN 11)   - try percocet as directed Prescriptions: PERCOCET 5-325 MG TABS (OXYCODONE-ACETAMINOPHEN) take 1 tablet every six hours as needed only  #120 x 0   Entered and Authorized by:   Kalman Shan MD   Signed by:   Kalman Shan MD on 08/24/2010   Method used:   Print then Give to Patient   RxID:   [id number]                  Flu Vaccine Consent Questions     Do you have a history of severe allergic reactions to this vaccine? no    Any prior history of allergic reactions to egg and/or gelatin? no    Do you have a sensitivity to the preservative Thimersol? no    Do you have a past history of Guillan-Barre Syndrome? no    Do you currently have an acute febrile illness? no    Have you ever had a severe reaction to latex? no    Vaccine information given and explained to patient? yes    Are you currently pregnant? no    Lot Number:[id number]   Exp Date:06/18/2011   Site Given  Left Deltoid IMedflu   Mindy Silva  August 24, 2010 3:24 PM  Orders Added: 1)  Flu Vaccine 46yrs + MEDICARE PATIENTS [Q2039] 2)  Administration Flu vaccine - MCR [G0008] 3)  Pneumococcal Vaccine [90732] 4)  Admin 1st Vaccine [90471] 5)  Est. Patient Level IV [41324]    Immunizations Administered:  Pneumonia Vaccine:    Vaccine Type: Pneumovax    Site: right deltoid    Mfr: Merck    Dose: 0.5 ml    Route: IM    Given by: Carver Fila    Exp. Date: 01/05/2012    Lot #: [id number]

## 2011-01-18 NOTE — Assessment & Plan Note (Signed)
Summary: alevolitis or early infection/lung nodule, ? lung ca/apc   Visit Type:  Initial Consult Copy to:  Dr. Evette Cristal Primary Provider/Referring Provider:  Dr. Creta Levin, [place] and Dr. Wandalee Ferdinand [facility]  CC:  Pulmonary consult for abnormal CT scan. Marland Kitchen  History of Present Illness: IOV 03/25/2010: 77 year old smoker with pulmonary nodule. Story provided by both son Mr. Trenton Founds and patient herself. IT appears that since late summer 2010 she has been having monthly bouts of  nausea, vomitting, diarrhea that last 3 days. IT appears these appear clockwork each month. She  has reportedly seen Dr. Evette Cristal of this and extensive GI workup has been negative per son. She was then subjected to pan Body Ct on 03/16/2010 and this shows 1.8cm ggo on image 15 and im 33 of the CT chest. 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 THerefore, refrred here.   In addition since monday 03/22/2010, she has been having symptoms of AECOPD. C.o worsening dyspnea compared to baesline. Unclear what her true baseline is because she stops from osteoporosis related back pain but now dyspneic of household work. Dyspnea is relieved by rest. She is also having increased cough and some associated left infrascapular chest pain that is worse with cough withut any radiation. STarted on avelox and 7d pred taper on 4/4. Despite that she is not beter. In fact, she states she is worse. Denies sputum, fever, orthopnea, pnd, chills, hemoptysis. Last episode of vomitting and diarrhea was 1 month ago.  She is also c/o lack of weight gain although denies weigh loss specificially.  She is also itnerested in quitting smoking. 1 ppd x age 85. Now at half pack. Prior intolerance to nicotine patch. Wants to try chantix.  Preventive Screening-Counseling & Management  Alcohol-Tobacco     Smoking Status: current  Current Medications (verified): 1)  Spiriva Handihaler 18 Mcg Caps  (Tiotropium Bromide Monohydrate) .... Inhale One Capsule Once Daily 2)  Proair Hfa 108 (90 Base) Mcg/act Aers (Albuterol Sulfate) .... As Needed 3)  Levothroid 50 Mcg Tabs (Levothyroxine Sodium) .... Take 1 Tablet By Mouth Once A Day 4)  Bisoprolol Fumarate 5 Mg Tabs (Bisoprolol Fumarate) .... Take One and Half Tablets Once Daily 5)  Prednisone 20 Mg Tabs (Prednisone) .... Taper As Directed 6)  Avelox 400 Mg Tabs (Moxifloxacin Hcl) .... Take 1 Tablet By Mouth Once A Day 7)  Zoloft 50 Mg Tabs (Sertraline Hcl) .... Take 1 Tablet By Mouth Once A Day 8)  Amlodipine Besylate 10 Mg Tabs (Amlodipine Besylate) .... Take 1 Tablet By Mouth Once A Day  Allergies (verified): 1)  ! Jonne Ply  Past History:  Past Medical History: Allergic Rhinitis Asthma Hypertension Hyperlipidemia Depression Osteoarthritis Benign appearing adrenal adenoma - MRI ABD 02/15/2010 and MR 2009 unchanged Altered mental status secondary to narcotics overdose      (unintentional). - Sept 2009 Chronic pain secondary to osteoporosis with compression  deformities Bilateral nephrolithiasis. Largest calculus is in the lower   pole of the left kidney measuring  1 cm  - CT 2/21/211 Severe kyphosis at T12..  Labs 02/03/10 by Dr. Evette Cristal: Creatinine-0.60, Hgb-15.0, HCT-45.8, BUN-12, AST-16, ALT-10  Past Surgical History: Vertebroplasty x2 Sinus surgery Total Abdominal Hysterectomy Total Knee Arthroplasty Shoulder Surgery 2007 -  Avascular necrosis, left humeral head, with large loose body, status      post left shoulder hemiarthroplasty.  Stent placed I believe in the renal artery in 2002 by Dr. Madilyn Fireman.  Nasal surgery  1982.  Four surgeries on her right ear. 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  Lumbar vertebroplasty July 2000.  Family History: Mother- heart disease, lung cancer, smoker Aunt-several had cancer and heart attacks  Social History: Patient is a current smoker.  Pt started smoking  in 1961 avg 1/2 ppd.   Widowed Retired  Smoking Status:  current  Review of Systems       The patient complains of shortness of breath with activity, chest pain, loss of appetite, weight change, abdominal pain, headaches, and anxiety.  The patient denies shortness of breath at rest, productive cough, non-productive cough, coughing up blood, irregular heartbeats, acid heartburn, indigestion, difficulty swallowing, sore throat, tooth/dental problems, nasal congestion/difficulty breathing through nose, sneezing, itching, ear ache, depression, hand/feet swelling, joint stiffness or pain, rash, change in color of mucus, and fever.    Vital Signs:  Patient profile:   77 year old female Height:      60 inches Weight:      100.50 pounds BMI:     19.70 O2 Sat:      97 % on Room air Temp:     98.2 degrees F oral Pulse rate:   54 / minute BP sitting:   130 / 80  (right arm) Cuff size:   regular  Vitals Entered By: Carron Curie CMA (March 25, 2010 1:36 PM)  O2 Flow:  Room air  Serial Vital Signs/Assessments:  Comments: Ambulatory Pulse Oximetry  Resting; HR__60___    02 Sat__96%RA___  Lap1 (185 feet)   HR__78___   02 Sat__95%RA___ Lap2 (185 feet)   HR__69___   02 Sat__96%RA___    Lap3 (185 feet)   HR__71___   02 Sat__94%RA___  _x__Test Completed without Difficulty ___Test Stopped due to:  Zackery Barefoot CMA  March 25, 2010 2:51 PM    By: Zackery Barefoot CMA   CC: Pulmonary consult for abnormal CT scan.  Comments Medications reviewed with patient Carron Curie CMA  March 25, 2010 1:44 PM Daytime phone number verified with patient.    Physical Exam  General:  well developed, well nourished, in no acute distress Head:  normocephalic and atraumatic Eyes:  PERRLA/EOM intact; conjunctiva and sclera clear Ears:  TMs intact and clear with normal canals Nose:  no deformity, discharge, inflammation, or lesions Mouth:  no deformity or lesions Neck:  no masses, thyromegaly, or abnormal  cervical nodes Chest Wall:  no deformities noted Lungs:  clear bilaterally to auscultation and percussion Heart:  regular rate and rhythm, S1, S2 without murmurs, rubs, gallops, or clicks Abdomen:  bowel sounds positive; abdomen soft and non-tender without masses, or organomegaly Msk:  no deformity or scoliosis noted with normal posture Pulses:  pulses normal Extremities:  no clubbing, cyanosis, edema, or deformity noted Neurologic:  CN II-XII grossly intact with normal reflexes, coordination, muscle strength and tone Skin:  intact without lesions or rashes Cervical Nodes:  no significant adenopathy Axillary Nodes:  no significant adenopathy Psych:  alert and cooperative; normal mood and affect; normal attention span and concentration   MISC. Report  Procedure date:  02/03/2010  Findings:      Labs 02/03/10 by Dr. Evette Cristal: Creatinine-0.60, Hgb-15.0, HCT-45.8, BUN-12, AST-16, ALT-10  CT of Chest  Procedure date:  03/16/2010  Findings:      pan Body Ct on 03/16/2010 and this shows 1.8cm ggo on image 15 and im 33 of the CT chest. 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 There are  borderline sized AP window   and pretracheal lymph nodes.  AP window lymph node has a short axis   diameter of 10 mm.  Comments:      I independently reviewed scan and agree. I also cmpared to the CT abdomen lung cut from 02/08/2010 and it appears that LLL mass might have been there back in FEb 2011.   Impression & Recommendations:  Problem # 1:  C O P D WITH ACUTE EXACERBATION (ICD-491.21) Assessment New She appears to  be having all the symptoms classic of moderate AE-COPD. BUT i am surprised she is not better with avelox and pred taper. Currenty in 3-4th day. I do not see any other alternative dx for symptoms. I will extend pred taper out and monitor. I have encouraged some patience. They verbalized understanding  Problem # 2:  C O P D (ICD-496) Assessment:  New She most likely has copd based on hx and exam. Did not desaturate on exam.   PLAn continue spiriva get full pft to assess severity  Problem # 3:  SMOKER (ICD-305.1) Assessment: New Needs to quit. For now quit on own. Will address chantix in more detail at followup Orders: Misc. Referral (Misc. Ref) Radiology Referral (Radiology) Pulmonary Referral (Pulmonary) Consultation Level V [phone])  Problem # 4:  CHEST PAIN-UNSPECIFIED (ICD-786.50) Assessment: New  Left infrascapular chest pain with reproducible tnderness and temporally related to cough. will monitor this. IF persists, get imaging to rule out rib fracture in lieu of osteoporosis. Pain is not related to lung nodule  Orders: Consultation Level V (47829)  Problem # 5:  PULMONARY NODULE (ICD-518.89) Assessment: New 1.8cm LUL and LLL GGO/Nodule on CT 3/39/2011 in a smoker. DDx is Bronchoalveolar cell carcinoma versus aspiration pneumonitis esp with her vomitting history. Nodule likely present in LLL even in Feb 2011 based on review of CT  abdomen lung cut. Therefore, best to proceed iwth ruling out or in lung cnacer. To start with, get a) lung cnacer antigen panel serum test from [facility] labs; b) and PET scan. Will reviwe after this to decide on  biopsy.   Orders: Misc. Referral (Misc. Ref) Radiology Referral (Radiology) Pulmonary Referral (Pulmonary) Consultation Level V [phone])  Medications Added to Medication List This Visit: 1)  Spiriva Handihaler 18 Mcg Caps (Tiotropium bromide monohydrate) .... Inhale one capsule once daily 2)  Proair Hfa 108 (90 Base) Mcg/act Aers (Albuterol sulfate) .... As needed 3)  Levothroid 50 Mcg Tabs (Levothyroxine sodium) .... Take 1 tablet by mouth once a day 4)  Bisoprolol Fumarate 5 Mg Tabs (Bisoprolol fumarate) .... Take one and half tablets once daily 5)  Prednisone 20 Mg Tabs (Prednisone) .... Taper as directed 6)  Avelox 400 Mg Tabs (Moxifloxacin hcl) .... Take 1 tablet by mouth  once a day 7)  Zoloft 50 Mg Tabs (Sertraline hcl) .... Take 1 tablet by mouth once a day 8)  Amlodipine Besylate 10 Mg Tabs (Amlodipine besylate) .... Take 1 tablet by mouth once a day 9)  Prednisone 10 Mg Tabs (Prednisone) .... 4 tablets daily x3 days, then 3 tablets daily x 3 days, then 2 tablets daily x3 days, then 1 tablet daily x3 days, then stop 10)  Lorazepam 0.5 Mg Tabs (Lorazepam) .... Take 1 tablet 1h before pet scan  Patient Instructions: 1)  please do walking test for oxygen levels 2)  plese do full PFTs at our office 3)  please do ONC-IMMNUNE lung cancer antigen panel test 4)  please do PET scan 5)  finish your avelox course 6)  i have readjusted your prednisone taper - please take new one 7)  return to see me in 1-2 weeks after above 8)  continue your other meds 9)  for any worsening or new problems call us or visit [place] sooner Prescriptions: LORAZEPAM 0.5 MG TABS (LORAZEPAM) take 1 tablet 1h before PET scan  #1 x 0   Entered and Authorized by:   Kalman Shan MD   Signed by:   Kalman Shan MD on 03/25/2010   Method used:   Print then Give to Patient   RxID:   [id number] PREDNISONE 10 MG  TABS (PREDNISONE) 4 tablets daily x3 days, then 3 tablets daily x 3 days, then 2 tablets daily x3 days, then 1 tablet daily x3 days, then stop  #30 x 0   Entered and Authorized by:   Kalman Shan MD   Signed by:   Kalman Shan MD on 03/25/2010   Method used:   Print then Give to Patient   RxID:   [id number]    Appended Document: alevolitis or early infection/lung nodule, ? lung ca/apc jen, [facility] test panel is negative. so, not helpful. let them have pet and come to review with me. pls communicate with them  Appended Document: alevolitis or early infection/lung nodule, ? lung ca/apc advised pt and her son that onc immune was negative, hence the need for PET scan. Scna set for 04/05/10 at 7:45am pt aware.

## 2011-01-18 NOTE — Letter (Signed)
Summary: [facility]  [facility]   Imported By: Sherian Rein 10/13/2010 14:48:12  _____________________________________________________________________  External Attachment:    Type:   Image     Comment:   External Document

## 2011-01-18 NOTE — Progress Notes (Signed)
Summary: needs ENB  Phone Note Outgoing Call   Summary of Call: called patient. Said [facility] recommends EB[facility]. I have given super D CT to Dr. Delton Coombes. He has it. I will email DEe Talley [facility] and get ENB set up Initial call taken by: Kalman Shan MD,  August 13, 2010 4:59 PM     Appended Document: needs ENB pls callpatient and say ENB bronch set for 08/20/2010 AM 2nd case. Please let her know [facility] OR dept wil call and give her instructions. Wil be in Dr. Scheryl Darter OR 7 but Dr. Delton Coombes and Dr. Edwyna Shell will do it. I will be there for case  Appended Document: needs ENB Pt advised of date of Bronch and that it will be with Dr. Edwyna Shell OR. Pt states understanding. I also advised that [facility] will contact her with instructions. Pt wants to know can she be kept overnight and released on Sat because she lives alone and does not want to be alone after procedure. Please advise.   Appended Document: needs ENB dee talley emailed saying that she will change status to overnight obs. so, after procedure dr. Delton Coombes or me or dr. Edwyna Shell will admit her. She needs to bring all her meds with her  Appended Document: needs ENB pt advised.

## 2011-01-18 NOTE — Letter (Signed)
Summary: [facility]   Imported By: Sherian Rein 04/05/2010 14:27:08  _____________________________________________________________________  External Attachment:    Type:   Image     Comment:   External Document

## 2011-01-18 NOTE — Progress Notes (Signed)
Summary: Orders needed for bronch to be done on 9/14  Phone Note Other Incoming   Caller: Alcario Drought at [facility] Pre-admit, # [phone] Summary of Call: Patient is coming in today @ [facility] for pre-admission before procedure on 09/01/2010 with Dr. Delton Coombes. They are needing orders faxed over to [phone]. I have paged Dr. Delton Coombes regarding this matter and will await callback. Initial call taken by: Michel Bickers CMA,  August 31, 2010 9:13 AM  Follow-up for Phone Call        RB is aware and says he will take care of the orders and send them over. LMOMTCB for Erica @ pre-admit.Michel Bickers CMA  August 31, 2010 9:35 AM  I spoke with Alcario Drought and she did receive orders as needed from [place].Michel Bickers [facility]  August 31, 2010 10:29 AM

## 2011-01-18 NOTE — Progress Notes (Signed)
Summary: [facility] appt  Phone Note Other Incoming   Caller: Dr. Marchelle Gearing Summary of Call: He scheduled pt for [facility] on 08-26-10 at 3:30pm. Pt aware of appt and location of [facility]. Carron Curie CMA  August 25, 2010 1:42 PM  Initial call taken by: Carron Curie CMA,  August 25, 2010 1:42 PM

## 2011-01-18 NOTE — Assessment & Plan Note (Signed)
Summary: 3 WK F/U PULMONARY NODULE/OK per JC/RJC   Visit Type:  Follow-up Copy to:  Dr. Evette Cristal Primary Provider/Referring Provider:  Dr. Creta Levin, [place] and Dr. Wandalee Ferdinand - [facility]  CC:  PET scan results, pt states she also wants to go over the blood tests, Pt states she fractured her breast bone and has been having a lot of pain with it, pt states hse is having a lot trouble breathing when she lies down at night x 4 weeks, and pt states she has a hard time taking a deep breath without it causing pain.  History of Present Illness: 77 year old wiht severe osteoporosis. Followup  pulmonary nodules LUL and LLL  on CT 03/16/2010 in setting of idiopathich cyclical nausea/vomitting, COPD symptoms with PFTs pending and Tobacco Abuse.  OV 04/27/2010: Followup  pulmonary nodules LUL and LLL  on CT 03/16/2010 in setting of idiopathich cyclical nausea/vomitting, COPD symptoms with PFTs pending and Tobacco Abuse. Last visit was 03/25/2010. We ordered PET scan and oncimmune serum lung cancer antigen test to get a better sense of pre-test probablity for lung cancer. SHe is here to review those. She was also supposed to have diagnostic/staging PFT for COPD. However, she has not had that. In interim, on 04/02/2010 she fractured her sternum while bending over to pick up her cat. This has shown up on the PET scan on 04/12/2010. Per patient and son, PMD has stated this is due to osteoporsis. THer are no other new complaints. STil smokes.   Of note, the cyclical vomitting episodes are slightly better  Current Medications (verified): 1)  Spiriva Handihaler 18 Mcg Caps (Tiotropium Bromide Monohydrate) .... Inhale One Capsule Once Daily 2)  Proair Hfa 108 (90 Base) Mcg/act Aers (Albuterol Sulfate) .... As Needed 3)  Levothroid 50 Mcg Tabs (Levothyroxine Sodium) .... Take 1 Tablet By Mouth Once A Day 4)  Bisoprolol Fumarate 5 Mg Tabs (Bisoprolol Fumarate) .... Take One and Half Tablets Once Daily 5)  Avelox  400 Mg Tabs (Moxifloxacin Hcl) .... Take 1 Tablet By Mouth Once A Day 6)  Zoloft 50 Mg Tabs (Sertraline Hcl) .... Take 1 Tablet By Mouth Once A Day 7)  Amlodipine Besylate 10 Mg Tabs (Amlodipine Besylate) .... Take 1 Tablet By Mouth Once A Day 8)  Lorazepam 0.5 Mg Tabs (Lorazepam) .... Take 1 Hour Before Pet Scan  Allergies (verified): 1)  ! Jonne Ply  Past History:  Family History: Last updated: 03/25/2010 Mother- heart disease, lung cancer, smoker Aunt-several had cancer and heart attacks  Social History: Last updated: 03/25/2010 Patient is a current smoker.  Pt started smoking  in 1961 avg 1/2 ppd.  Widowed Retired  Risk Factors: Smoking Status: current (03/25/2010)  Past Medical History: Allergic Rhinitis Asthma/COPD NOS Hypertension Hyperlipidemia Depression Osteoarthritis SEvere osteoporosis Benign appearing adrenal adenoma - MRI ABD 02/15/2010 and MR 2009 unchanged and PET 04/12/2010 Altered mental status secondary to narcotics overdose      (unintentional). - Sept 2009 Chronic pain secondary to osteoporosis with compression  deformities Bilateral nephrolithiasis. Largest calculus is in the lower   pole of the left kidney measuring  1 cm  - CT 2/21/211 Severe kyphosis at T12..  Labs 02/03/10 by Dr. Evette Cristal: Creatinine-0.60, Hgb-15.0, HCT-45.8, BUN-12, AST-16, ALT-10  Past Surgical History: Reviewed history from 03/25/2010 and no changes required. Vertebroplasty x2 Sinus surgery Total Abdominal Hysterectomy Total Knee Arthroplasty Shoulder Surgery 2007 -  Avascular necrosis, left humeral head, with large loose body, status  post left shoulder hemiarthroplasty.  Stent placed I believe in the renal artery in 2002 by Dr. Madilyn Fireman.  Nasal surgery 1982.  Four surgeries on her right ear. 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  Lumbar vertebroplasty July 2000.  Family History: Reviewed history from 03/25/2010 and no changes required. Mother- heart disease, lung cancer,  smoker Aunt-several had cancer and heart attacks  Social History: Reviewed history from 03/25/2010 and no changes required. Patient is a current smoker.  Pt started smoking  in 1961 avg 1/2 ppd.  Widowed Retired  Review of Systems       The patient complains of abdominal pain, anxiety, depression, hand/feet swelling, and joint stiffness or pain.  The patient denies shortness of breath with activity, shortness of breath at rest, productive cough, non-productive cough, coughing up blood, chest pain, irregular heartbeats, acid heartburn, indigestion, loss of appetite, weight change, difficulty swallowing, sore throat, tooth/dental problems, headaches, nasal congestion/difficulty breathing through nose, sneezing, itching, ear ache, rash, change in color of mucus, and fever.    Vital Signs:  Patient profile:   77 year old female Height:      60 inches (152.40 cm) Weight:      93 pounds (42.27 kg) BMI:     18.23 O2 Sat:      97 % on Room air Temp:     98.1 degrees F (36.72 degrees C) oral Pulse rate:   66 / minute BP sitting:   106 / 52  (right arm) Cuff size:   regular  Vitals Entered By: Carron Curie CMA (Apr 27, 2010 2:40 PM)  O2 Flow:  Room air CC: PET scan results, pt states she also wants to go over the blood tests, Pt states she fractured her breast bone and has been having a lot of pain with it, pt states hse is having a lot trouble breathing when she lies down at night x 4 weeks, pt states she has a hard time taking a deep breath without it causing pain Comments Meds and allergies updated Daytime phone number verified with patient.  Carron Curie CMA  Apr 27, 2010 2:40 PM    Physical Exam  General:  well developed, well nourished, in no acute distresscachectic.   Head:  normocephalic and atraumatic Eyes:  PERRLA/EOM intact; conjunctiva and sclera clear Ears:  TMs intact and clear with normal canals Nose:  no deformity, discharge, inflammation, or lesions Mouth:   no deformity or lesions Neck:  no masses, thyromegaly, or abnormal cervical nodes Chest Wall:  no deformities noted Lungs:  clear bilaterally to auscultation and percussion Heart:  regular rate and rhythm, S1, S2 without murmurs, rubs, gallops, or clicks Abdomen:  bowel sounds positive; abdomen soft and non-tender without masses, or organomegaly Msk:  no deformity or scoliosis noted with normal posture Pulses:  pulses normal Extremities:  no clubbing, cyanosis, edema, or deformity noted Neurologic:  CN II-XII grossly intact with normal reflexes, coordination, muscle strength and tone Skin:  intact without lesions or rashes Cervical Nodes:  no significant adenopathy Axillary Nodes:  no significant adenopathy Psych:  alert and cooperative; normal mood and affect; normal attention span and concentration   MISC. Report  Procedure date:  04/12/2010  Findings:      PET SCAN LUL im 67 - SUV 2.6 LL nodule SIV 3.1 Rt adrenal adenoma + Left thyroid enlarged + Sternal fracture on image 38  Comments:      independently reviewed  Impression & Recommendations:  Problem # 1:  PULMONARY NODULE (ICD-518.89) Assessment Unchanged PET scan 04/12/2010  LUL nodule SUV 2.6 on im# 67 and LLL nodule is SUV 3.1 .Oncimmune lung cnacer antigen test is negaitve (this test has high specificity but low sensitivity). Overall these nodules have indeterminate probablity for lung cancer. Aspiration pna is another possibility. We discussd serial monitoring versus biopsy via CT guided and Electromagnetic approach at [facility]. Risks of both procedures explained. Limitations explained. She prefers serial monitoring. Will do next CT chest  3months from 04/12/2010. Son in agreement  Orders: Radiology Referral (Radiology) Est. Patient Level IV (16109)  Problem # 2:  SMOKER (ICD-305.1) Assessment: Comment Only trying to quit on own. Will address more at fu Orders: Radiology Referral (Radiology) Est. Patient Level IV  (60454)  Problem # 3:  C O P D (ICD-496) Assessment: Unchanged  She most likely has copd based on hx and exam. Did not desaturate on exam.  Stil has not had PFTs. Currently stble diseae  PLAn continue spiriva get full pft to assess severity  Problem # 4:  C O P D WITH ACUTE EXACERBATION (ICD-491.21) Assessment: Improved resolved  Problem # 5:  OSTEOPOROSIS, SEVERE (ICD-733.00) Assessment: New she has spontaneous sternal fracture. She states this is due to severe osteoporosis. I have asked her and son to d/w PMD if there could be any other pathology  Problem # 6:  GOITER, UNSPECIFIED (ICD-240.9) Assessment: New  left thyroid lobe is enlarged on PET scan 04/12/2010. Will fax this to PMD for review  Orders: Est. Patient Level IV (09811)  Patient Instructions: 1)  have ct scan in 3 months from date of PET scan 2)  have spirometry in 3 months 3)  return to see me after above 4)  any problems in between call us or come sooner 5)  continue your medicines

## 2011-01-18 NOTE — Letter (Signed)
Summary: [facility]  [facility]   Imported By: Sherian Rein 11/18/2010 14:48:13  _____________________________________________________________________  External Attachment:    Type:   Image     Comment:   External Document

## 2011-01-18 NOTE — Letter (Signed)
Summary: [facility]   Imported By: Sherian Rein 04/05/2010 14:29:24  _____________________________________________________________________  External Attachment:    Type:   Image     Comment:   External Document

## 2011-02-09 NOTE — Letter (Signed)
Summary: [facility]  [facility]   Imported By: Lennie Odor 02/03/2011 17:08:54  _____________________________________________________________________  External Attachment:    Type:   Image     Comment:   External Document

## 2011-02-17 ENCOUNTER — Other Ambulatory Visit: Payer: Self-pay | Admitting: Orthopedic Surgery

## 2011-02-17 DIAGNOSIS — M5410 Radiculopathy, site unspecified: Secondary | ICD-10-CM

## 2011-02-18 ENCOUNTER — Ambulatory Visit
Admission: RE | Admit: 2011-02-18 | Discharge: 2011-02-18 | Disposition: A | Discharge status: Home / Self Care | Payer: Self-pay | Source: Ambulatory Visit | Attending: Orthopedic Surgery | Admitting: Orthopedic Surgery

## 2011-02-18 DIAGNOSIS — M5410 Radiculopathy, site unspecified: Secondary | ICD-10-CM

## 2011-03-03 LAB — COMPREHENSIVE METABOLIC PANEL
ALT: 12 U/L (ref 0–35)
Alkaline Phosphatase: 82 U/L (ref 39–117)
CO2: 29 mEq/L (ref 19–32)
Chloride: 104 mEq/L (ref 96–112)
GFR calc non Af Amer: >60 mL/min (ref >60)
Glucose, Bld: 83 mg/dL (ref 70–99)
Potassium: 4.4 mEq/L (ref 3.5–5.1)
Sodium: 139 mEq/L (ref 135–145)
Total Bilirubin: 0.3 mg/dL (ref 0.3–1.2)

## 2011-03-03 LAB — PROTIME-INR
INR: 0.92 (ref 0.00–1.49)
Prothrombin Time: 12.3 seconds (ref 11.6–15.2)

## 2011-03-03 LAB — CBC
HCT: 45.1 % (ref 36.0–46.0)
Hemoglobin: 15.2 g/dL — ABNORMAL HIGH (ref 12.0–15.0)
MCH: 30.7 pg (ref 26.0–34.0)
MCHC: 33.6 g/dL (ref 30.0–36.0)
MCHC: 33.7 g/dL (ref 30.0–36.0)
MCV: 91.3 fL (ref 78.0–100.0)
Platelets: 392 10*3/uL (ref 150–400)
RBC: 4.97 MIL/uL (ref 3.87–5.11)
RDW: 13.8 % (ref 11.5–15.5)
WBC: 10.9 10*3/uL — ABNORMAL HIGH (ref 4.0–10.5)
WBC: 12.5 10*3/uL — ABNORMAL HIGH (ref 4.0–10.5)

## 2011-03-03 LAB — BASIC METABOLIC PANEL
BUN: 10 mg/dL (ref 6–23)
Chloride: 105 mEq/L (ref 96–112)
Creatinine, Ser: 0.59 mg/dL (ref 0.4–1.2)
Glucose, Bld: 91 mg/dL (ref 70–99)

## 2011-03-03 LAB — FUNGUS CULTURE W SMEAR: Fungal Smear: NONE SEEN

## 2011-03-03 LAB — SURGICAL PCR SCREEN: MRSA, PCR: NEGATIVE

## 2011-03-03 LAB — AFB CULTURE WITH SMEAR (NOT AT ARMC)

## 2011-03-03 LAB — CULTURE, RESPIRATORY W GRAM STAIN: Gram Stain: NONE SEEN

## 2011-03-03 LAB — APTT: aPTT: 30 seconds (ref 24–37)

## 2011-03-09 LAB — DIFFERENTIAL
Basophils Relative: 2 % — ABNORMAL HIGH (ref 0–1)
Eosinophils Absolute: 0.1 10*3/uL (ref 0.0–0.7)
Eosinophils Relative: 1 % (ref 0–5)
Monocytes Relative: 10 % (ref 3–12)
Neutrophils Relative %: 59 % (ref 43–77)

## 2011-03-09 LAB — URINALYSIS, ROUTINE W REFLEX MICROSCOPIC
Glucose, UA: NEGATIVE mg/dL
Ketones, ur: NEGATIVE mg/dL
Nitrite: NEGATIVE
Specific Gravity, Urine: 1.02 (ref 1.005–1.030)
pH: 6 (ref 5.0–8.0)

## 2011-03-09 LAB — URINE MICROSCOPIC-ADD ON

## 2011-03-09 LAB — BASIC METABOLIC PANEL
BUN: 18 mg/dL (ref 6–23)
CO2: 25 mEq/L (ref 19–32)
Chloride: 110 mEq/L (ref 96–112)
Creatinine, Ser: 0.55 mg/dL (ref 0.4–1.2)
Potassium: 3.6 mEq/L (ref 3.5–5.1)

## 2011-03-09 LAB — POCT CARDIAC MARKERS: Troponin i, poc: <0.05 ng/mL (ref 0.00–0.09)

## 2011-03-09 LAB — CBC
HCT: 42.8 % (ref 36.0–46.0)
MCHC: 32.9 g/dL (ref 30.0–36.0)
MCV: 96.7 fL (ref 78.0–100.0)
Platelets: 377 10*3/uL (ref 150–400)

## 2011-03-09 LAB — D-DIMER, QUANTITATIVE: D-Dimer, Quant: 0.28 ug/mL-FEU (ref 0.00–0.48)

## 2011-05-03 NOTE — Discharge Summary (Signed)
NAME:  [name], [name]           ACCOUNT NO.:  [id number]   MEDICAL RECORD NO.:  [id number]          PATIENT TYPE:  INP   LOCATION:  3029                         FACILITY:  [facility]   PHYSICIAN:  Wilson Singer, M.D.DATE OF BIRTH:  [birth year]   DATE OF ADMISSION:  12/29/2007  DATE OF DISCHARGE:  01/02/2008                               DISCHARGE SUMMARY   FINAL DISCHARGE DIAGNOSIS:  1. Unresponsiveness and altered mental status due to accidental      overdose with fentanyl patch.  2. Pneumonia, resolving clinically.  3. Status post vertebroplasty on December 28, 2007, seen by Dr. Luiz Blare,      orthopedic surgeon.   CONDITION ON DISCHARGE:  Stable.   MEDICATIONS ON DISCHARGE:  She will continue her home medications  1. Norvasc 10 mg daily.  2. Protonix 40 mg daily.  3. Also she will have 5 days of Avelox 400 mg daily for 5 days.   HISTORY:  This very pleasant 75-year lady was admitted in an  unresponsive state to the point where she had respiratory depression and  had to have mechanical ventilation performed by the critical care team.  It was felt that this respiratory failure was due to accidental overdose  of narcotic fentanyl patches.  She stayed in the intensive care unit for  a couple of days, did well, and was able to be extubated without any  major problems.  She was noted to have an infiltrate and was initially  covered with intravenous Elita Quick.  Once she was extubated, the critical  care team feels that her pneumonia is resolving and she would only  require Avelox for a further 5 days.  She has been seen by Dr. Luiz Blare  while in the hospital who ordered an MRI of the lumbar spine which shows  stability of the vertebroplasty.  Today she is doing well and has no  further problems or issues.   On physical examination, temperature 98.1, blood pressure 143/94, pulse  82, saturation 98% on room air.  Heart sounds are present and normal.  Lung fields are entirely clear.   Investigations today show sodium around  138, potassium 3.4, bicarbonate 24, BUN 12, creatinine 0.62. Hemoglobin  15.2, white blood cell count 14.5, platelets 313.   FURTHER DISPOSITION:  She will go home with a 5-day course of Avelox 400  mg daily.  She will see Dr. Luiz Blare in two weeks' time for followup, and  she will also have an appoint with her primary care physician in about a  weeks' time which I have encouraged her to keep.      Wilson Singer, M.D.  Electronically Signed     NCG/MEDQ  D:  01/02/2008  T:  01/02/2008  Job:  [id number]   cc:   Georgianne Fick, M.D.  Harvie Junior, M.D.

## 2011-05-03 NOTE — H&P (Signed)
NAME:  [name], [name] NO.:  [id number]   MEDICAL RECORD NO.:  [id number]          PATIENT TYPE:  EMS   LOCATION:  MAJO                         FACILITY:  [facility]   PHYSICIAN:  Peggye Pitt, M.D. DATE OF BIRTH:  [birth year]   DATE OF ADMISSION:  08/23/2008  DATE OF DISCHARGE:                              HISTORY & PHYSICAL   PRIMARY CARE PHYSICIAN:  Georgianne Fick, MD   CHIEF COMPLAINT:  Altered mental status.   HISTORY OF PRESENT ILLNESS:  [name] is a pleasant 77 year old  Caucasian woman, who comes in with a history of altered mental status.  She has severe back pain secondary to osteoporosis and has had 2  vertebroplasties, but continues to suffer from this severe back pain.  It has now come to the point where she has had to have narcotic pain  management for her back.  She has been on fentanyl patch 100 mg for some  while.  Yesterday, she visited her PCP with complaints of continued back  pain and he prescribed Vicodin 5/500 mg.  She remembers taking at least  1 pill of the medication.  She has no recollection of the following  events.  Per son's report, when the family's caregiver came in this  morning, found her in bed very lethargic and only mumbling nonsensical  things, so she called her son which promptly came to bring her into the  emergency department.  Here, she was given Narcan, to which she  responded quickly and now she is back to her baseline mental status.  Note that she had a similar admission in January 2009, at which time she  needed to be mechanically ventilated secondary to severe respiratory  depression.  At that time, she had 3 fentanyl patches on at the same  time.  Also, of note, she just changed her fentanyl patch yesterday.   ALLERGIES:  She has no known drug allergies.   PAST MEDICAL HISTORY:  Only significant for hypertension and her  osteoporosis.   HOME MEDICATIONS:  1. Norvasc 10 mg p.o. daily.  2. Lexapro 20 mg  p.o. daily.  3. Bisoprolol 5 mg p.o. daily.  4. Hydrochlorothiazide 25 mg p.o. daily.  5. Fentanyl patch 100 mg change over 72 hours.   She is also on unknown doses of the following medications temazepam,  Klonopin, Zoloft, fexofenadine, trazodone, and Vicodin.   SOCIAL HISTORY:  She is married and lives with her husband, is  independent in all of her ADLs and even drives.  There is a caregiver,  who comes in and helps with cleaning and taking care of the husband.  She denies any illicit drug, alcohol, or tobacco use.   FAMILY HISTORY:  Noncontributory in this elderly lady.   REVIEW OF SYSTEMS:  A 10-system review of systems is negative except as  per HPI.   PHYSICAL EXAMINATION:  VITAL SIGNS:  Upon admission, blood pressure  148/72, heart rate 68, respirations 18, O2 sats 100% on 2 liters,  temperature was 99.5.  GENERAL:  At time of my examination, she was alert, awake, oriented x3,  not in acute distress.  HEENT:  Normocephalic, atraumatic.  Her pupils are equally reactive to  light and accommodation with intact extraocular movements.  NECK:  Supple with no JVD, no lymphadenopathy, no bruits or goiter.  LUNGS:  Clear to auscultation bilaterally.  CARDIOVASCULAR:  Regular rate and rhythm with no murmurs, rubs, or  gallops.  ABDOMEN:  Soft, nontender, nondistended with positive bowel sounds.  EXTREMITIES:  No edema and positive pulses.   LABS:  Upon admission, sodium 37, potassium 4.6, chloride 108, bicarb  22, BUN 15, creatinine 0.54, glucose of 109.  All LFTs are within normal  limits.  White count of 12.0 with an ANC of 10.0, hemoglobin of 13.5,  and platelets of 343.  Tylenol level was less than 10.  A UDS was  positive for benzodiazepine and opiates.  Point-of-care markers first  set was negative.  She had a chest x-ray with no acute distress and a CT  head with chronic ischemic changes and a possible acute maxillary  sinusitis.  She also had an EKG showed normal sinus  rhythm at a rate of  66 with an old anteroseptal infarct.  No new ST-T wave changes, may be  some left atrial enlargement.   ASSESSMENT AND PLAN:  1. Her altered mental status, which is a secondary to a narcotic      unintentional overdose, with a similar admission in January 2009.      At this time, we are not sure of the amount of Vicodin that she      took, but she did have a very quick response to Narcan and is now      back to her baseline.  Plan will be to keep overnight for a 23-hour      observation.  It will be a very delicate balance between pain      management and avoidance of narcotic side effects in this elderly,      thin frail lady.  2. For her hypertension, we will continue her home medications.  3. For prophylaxis, we will do a Protonix for GI prophylaxis and      subcutaneous heparin for DVT prophylaxis.      Peggye Pitt, M.D.  Electronically Signed     EH/MEDQ  D:  08/23/2008  T:  08/24/2008  Job:  [id number]   cc:   [facility] Team [facility]

## 2011-05-03 NOTE — Discharge Summary (Signed)
NAME:  [name], [name] NO.:  [id number]   MEDICAL RECORD NO.:  [id number]          PATIENT TYPE:  INP   LOCATION:  5013                         FACILITY:  [facility]   PHYSICIAN:  Hillery Aldo, M.D.   DATE OF BIRTH:  [birth year]   DATE OF ADMISSION:  08/23/2008  DATE OF DISCHARGE:  08/24/2008                               DISCHARGE SUMMARY   PRIMARY CARE PHYSICIAN:  Georgianne Fick, MD   DISCHARGE DIAGNOSES:  1. Altered mental status secondary to narcotics overdose      (unintentional).  2. Chronic pain secondary to osteoporosis with compression      deformities.  3. Hypertension.  4. Osteoporosis.   DISCHARGE MEDICATIONS:  1. Norvasc 10 mg daily.  2. Bisoprolol 7.5 mg daily.  3. Hydrochlorothiazide 25 mg daily.  4. Fentanyl patch 100 mcg q. 3 days.  5. Zoloft 100 mg daily.  6. Fexofenadine 180 mg daily.  7. Vicodin 5/325 one tablet q.12 h.  8. Calcitonin 1 spray in alternating nostrils daily.  9. Os-Cal plus D 600 mg b.i.d.  10.Celebrex 200 mg b.i.d.  11.Protonix 40 mg daily.   Note:  The patient was told to discontinue temazepam, trazodone,  Lexapro, and Klonopin.   CONSULTATIONS:  None.   BRIEF ADMISSION HISTORY OF PRESENT ILLNESS:  The patient is a very  pleasant 77 year old female with severe back pain secondary to  osteoporosis who has been self-medicating and was noted to have 2  fentanyl patches on her person in addition to taking extra doses of  Vicodin and being on multiple other medications that are sedating.  She  was admitted with altered mental status and given Narcan and  subsequently admitted for observation status.  For the full details,  please see the dictated report done by Dr. Ardyth Harps.   PROCEDURES AND DIAGNOSTIC STUDIES:  1. Chest x-ray on August 23, 2008 showed no evidence of acute      cardiopulmonary disease.  2. CT scan of the head on August 23, 2008 showed fluid in the      maxillary sinuses, chronic ischemic  changes, and postoperative      changes in the mastoid air cells bilaterally.   DISCHARGE LABORATORY VALUES:  No new labs were done beyond the admission  lab work.   HOSPITAL COURSE:  1. Altered mental status secondary to unintentional narcotics      overdose:  The patient responded well to Narcan and is now      mentating at her baseline.  Discussion was held with her son with      regard to giving someone in the family or caregiver charge of her      medications and dispensing them.  He indicates that this is not a      problem.  The patient's pharmacist at [facility] in [place] was      contacted and her medication list was verified.  Accordingly, to      simplify her regimen and risk of sedation, her temazepam,      trazodone, and Klonopin were discontinued.  These reviews were      discussed with the  patient's son and explained fully with written      discharge instructions.  2. Hypertension:  The patient's blood pressure was elevated and it was      noted that she had not been taking her hydrochlorothiazide.  New      prescription for hydrochlorothiazide was written for the patient.  3. Osteoporosis:  The patient is not on any bone building therapy.  We      will start her on calcitonin.  We started her on calcitonin and Os-      Cal plus D to help her build bone and to reduce her pain.  4. Chronic pain:  The patient can continue to use fentanyl safely if      it is dispensed.  She can continue to use Vicodin 2 tablets a day      if it is dispensed.  Additionally, we have put her on Celebrex and      Protonix for stomach protection and bone building therapies and      hopefully this multifactorial approach to her pain will alleviate      her pain.  She should follow up with her primary care physician for      nonresolution of pain.   DISPOSITION:  The patient is medically stable and will be discharged  home today.      Hillery Aldo, M.D.  Electronically Signed      CR/MEDQ  D:  08/24/2008  T:  08/25/2008  Job:  [id number]

## 2011-05-06 NOTE — Discharge Summary (Signed)
NAME:  [name], [name]           ACCOUNT NO.:  [id number]   MEDICAL RECORD NO.:  [id number]          PATIENT TYPE:  INP   LOCATION:  5005                         FACILITY:  [facility]   PHYSICIAN:  Rodney A. Mortenson, M.D.DATE OF BIRTH:  1932-02-01   DATE OF ADMISSION:  10/11/2005  DATE OF DISCHARGE:  10/15/2005                                 DISCHARGE SUMMARY   ADMITTING DIAGNOSES:  1.  Left knee osteoarthritis.  2.  Hypertension.  3.  Asthma.  4.  Seasonal allergies.  5.  Hyperlipidemia.  6.  Degenerative disk disease with history of compression fractures and      lumbar vertebroplasty.  7.  Osteoporosis.  8.  Right ear deafness.   DISCHARGE DIAGNOSES:  1.  Status post left total knee arthroplasty.  2.  Acute blood loss anemia secondary to surgery.  Patient was asymptomatic,      however, history of renal stent and therefore was transfused with 1 unit      of packed red blood cells.  3.  Hypertension.  4.  Asthma.  5.  Seasonal allergies.  6.  Hyperlipidemia.  7.  Degenerative disk disease of lumbar spine with history of      vertebroplasty.  8.  Osteoporosis.  9.  Deaf in the right ear.   HISTORY OF PRESENT ILLNESS:  Ms. [name] is a 77 year old white female  patient with a 10-year history of gradual onset, but progressively worsening  left knee pain.  Patient with no known injury or surgery on the left knee.  The pain is described as a constant sharp sensation throughout the knee  joint that sometimes radiates all the way down to her ankle.  The pain  increases with ambulation, decreases with sitting and rest.  The patient  currently is not on any pain medications.  She does have mechanical symptoms  of catching and locking.  She also notes swelling and a popping sensation in  the knee at times.  She does have waking pain.  The patient has undergone  cortisone injections into the left knee and viscus supplementation  injections, which both provided minimal relief.   The patient did try  BioniCare and that provided minimal to no relief.  The patient ambulates  with the walker or cane.   ALLERGIES:  No known drug allergies.   MEDICATIONS:  1.  Fosamax 70 mg one every Friday.  2.  Fexofenadine 180 mg one tab q.a.m.  3.  Bisoprolol 5 mg one tab p.o. q.a.m.  4.  Vytorin 10 mg one tab p.o. q.a.m.  5.  Norvasc 10 mg one tab p.o. q.a.m.  6.  Lexapro 10 mg one tab q.a.m.   SURGICAL PROCEDURE:  The patient was taken to the operating room on October 12, 2005 by Rinaldo Ratel, M.D. and Richardean Canal, P.A.-C.  The patient  was placed under general anesthesia and then underwent a left total knee  arthroplasty.  The following components were used:  A standard left femoral  component, a size 2.5 tibial tray, 12.5-mm polyethylene insert and a 3-  pegged patella.  All components were cemented.  Procedure was done using  computer navigation.  The patient tolerated the procedure well and returned  to recovery in good and stable condition.   CONSULTS:  The following consults were obtained while the patient was  hospitalized:  1.  PT.  2.  OT.  3.  Case Management.   HOSPITAL COURSE:  Postop day 1, patient afebrile, vital signs stable.  Patient with poor pain control, therefore PCA was discontinued and Percocet  was initiated for pain control.  The patient denied chest pain, shortness of  breath, nausea or vomiting.  H&H were 10.1 and 30.2.   Postop day 2, the patient's T-max was 99.4, vital signs otherwise stable.  H&H were 8.6 and 26.1.  Due to the fact that the patient did have a history  of a renal stent, she was transfused with a unit of packed red blood cells.  White count did increase to 16,400.  The patient was afebrile, therefore  white count was monitored.  The patient denied any chest pain or shortness  of breath.  Patient with some nausea and was placed on Protonix for GI  prophylaxis.   Postop day 3, patient with good pain control.  Making  good progress with  physical therapy.  No chest pain, shortness of breath, nausea or vomiting.  Appetite increasing.  The patient's H&H were 9.7 and 28.5.  White count  trended down and was 15,600.   Postop day 4, the patient had good pain control, doing well with transfers  and ambulation.  Vital signs stable and patient afebrile.  The patient was  discharged home on postop day 4 in good and stable condition.   ROUTINE LABORATORY DATA ON ADMISSION:  CBC:  White count was 12,300,  hemoglobin 14.5, hematocrit 43.7, platelets 340,000.  Coagulations on  admission:  PT was 12.9, INR of 1.0, PTT 32.  Routine chemistries on  admission:  All values within normal limits.  Hepatic enzymes on admission:  All values within normal limits.  Urinalysis on admission was negative.   EKG dated October 07, 2005 showed sinus bradycardia with a heart rate of 51  beats per minute, P-R interval of 150 msec and PRT axis was 74 and -561.   DISCHARGE INSTRUCTIONS:   MEDICATIONS:  The patient was to resume home medications except her Vicodin  and aspirin, patient to resume aspirin once finished with Lovenox.   The following medicines were added:  1.  Lovenox 40 mg one injection daily at 11 a.m., last dose October 25, 2005.  2.  Protonix 40 mg one daily at 8 a.m. x30 days for GI protection.  3.  Percocet 5/325 mg one to two tablets every 4-6 hours for pain.  4.  Oxycodone 10 mg one tab every 12 hours for pain.   DIET:  The patient has no restrictions on discharge.   WOUND CARE:  The patient is to keep wound clean and try to change the  dressing daily and may shower after 2 days if no drainage.   ACTIVITY:  The patient's is 50% weightbearing with a walker.   SPECIAL INSTRUCTIONS:  CPM 0-90 degrees 6-8 hours a day, increase by 10  degrees daily.   FOLLOWUP:  The patient needs to follow up with Dr. Chaney Malling in approximately 10 days from discharge; the patient is to call the office at  [phone] for  an appointment.   CONDITION ON DISCHARGE:  The patient was discharged home in good and stable  condition.      Richardean Canal, P.A.    ______________________________  Lenard Galloway Chaney Malling, M.D.    GC/MEDQ  D:  01/16/2006  T:  01/17/2006  Job:  [id number]

## 2011-05-06 NOTE — H&P (Signed)
NAME:  Priscilla, APPERSON NO.:  [id number]   MEDICAL RECORD NO.:  [id number]            PATIENT TYPE:   LOCATION:                                 FACILITY:   PHYSICIAN:  Rodney A. Mortenson, M.D.DATE OF BIRTH:  May 08, 1932   DATE OF ADMISSION:  10/11/2005  DATE OF DISCHARGE:                                HISTORY & PHYSICAL   CHIEF COMPLAINT:  Left knee pain for the last 10 years.   HISTORY OF PRESENT ILLNESS:  This 77 year old white female patient presented  to Dr. Chaney Malling with a 10-year history of gradual onset but progressively  worsening left knee pain. She has had no known injury or prior surgery to  her knee with pain at this point described as a constant sharp sensation  diffuse about the joint with some radiation all the way down to her ankle at  times. Pain increases with any walking and then decreases with sitting and  resting. She is not currently taking any medications for pain. She complains  of the knee popping, catching, locking and swelling at times. It also keeps  her up at night. No grinding and giving way. She has received cortisone and  Viscose supplementation injections in the past, and that just provided  minimal relief. She did try the BioniCare, and that provided minimal to no  relief. She is currently ambulating with the use of a cane or walker as  needed.   No known drug allergies.   CURRENT MEDICATIONS:  1.  Fosamax 70 mg 1 tablet p.o. every Friday.  2.  Fexofenadine 180 mg 1 tablet p.o. q.a.m.Marland Kitchen  3.  Bisoprolol  5 mg 1 tablet p.o. q.a.m.Marland Kitchen  4.  Vitorin 10 mg 1 tablet p.o. q.a.m..  5.  Norvasc 10 mg 1 tablet p.o. q.a.m..  6.  Lexapro 10 mg 1 tablet p.o. q.a.m.Marland Kitchen   PAST MEDICAL HISTORY:  1.  Hypertension for 10 years.  2.  Asthma for 15 years.  3.  Seasonal allergies.  4.  Possible left renal artery stenosis treated with the stent by Dr. Madilyn Fireman.  5.  Degenerative disk disease and compression fractures lumbar spine status      post for  vertebroplasty July 2000.  6.  Osteoporosis.  7.  History of right patella fracture June 2006.   She denies any history of diabetes mellitus, thyroid disease, hiatal hernia,  reflux, peptic ulcer disease, heart disease or any other chronic medical  condition other than noted previously.   PAST SURGICAL HISTORY:  1.  Stent placed I believe in the renal artery in 2002 by Dr. Madilyn Fireman.  2.  Nasal surgery 1982.  3.  Four surgeries on her right ear. 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  4.  Lumbar vertebroplasty July 2000.  5.  Hysterectomy and appendectomy at age 77.   She denies any complications from the above-mentioned procedures.   SOCIAL HISTORY:  She has a 20 pack-year history of cigarette smoking which  she quit 11 years ago. Does not drink any alcohol nor use any drugs. She is  married and has one son. She lives  in a one-story house with her husband  with four steps into the main entrance. She is retired from [employer]-  order. Her medical doctor is Dr. Nicholos Johns, of [facility]. His  phone number is [phone].   FAMILY HISTORY:  Mother died at age of 20 with osteoarthritis, heart disease  and lung cancer. She does not know anything about her father as to when he  died or his medical history. She does not have any siblings. Her son is 35  years old, and he is alive and well.   REVIEW OF SYSTEMS:  She does have mild arthritic symptoms in her hands. She  wears dentures on the upper and lower jaw line and does wear glasses. She  does have a living will but no power of attorney. All other systems are  negative and noncontributory.   PHYSICAL EXAMINATION:  GENERAL: Thin, well-developed, well-nourished white  female in no acute distress. Walks with a slight antalgic gait and a limp.  Mood and affect are appropriate. Accompanied by her husband.  VITAL SIGNS: Height 5 feet 1 inch, weight 113 pounds, BMI is 21. Temperature  97.5 degrees Fahrenheit, pulse 56, respirations 20 and  blood pressure  124/66.  HEENT: Normocephalic, atraumatic without frontal or maxillary sinus  tenderness to palpation. Conjunctiva pink. Sclerae anicteric. PERRLA. EOMs  intact. No visible external ear deformities. Right ear canal has cerumen  occluding the tympanic membrane. Left TM does appear to be a little bit  scarred but otherwise normal in appearance. Nose and nasal septum midline.  Nasal mucosa pink and moist without exudates or polyps noted. Dentures in  place. Pharynx without erythema or exudates. Tongue and uvula midline.  Tongue without fasciculations, and uvula rises equally with phonation.  NECK:  No visible masses or lesions noted. Trachea midline. No palpable  lymphadenopathy or thyromegaly. Carotids +2 bilaterally without bruits. Full  range of motion, nontender to palpation along the cervical spine.  CARDIOVASCULAR:  Heart rate and rhythm regular. S1-S2 present without rubs,  clicks or murmurs noted.  RESPIRATORY: Respirations even and unlabored. Breath sounds clear to  auscultation bilaterally without rales or wheezes noted.  ABDOMEN: Flat abdominal contour. Bowel sounds present x4 quadrants. Soft,  nontender to palpation without hepatosplenomegaly or CVA tenderness. Femoral  pulses +2 bilaterally. Nontender to palpation along the vertebral column.  BREASTS/GU/RECTAL/PELVIC: These exams deferred at this time.  MUSCULOSKELETAL: No obvious deformities bilateral upper extremities with  full range of motion of these extremities without pain. Radial pulses +2  bilaterally. She has full range of motion of her hips, ankles and toes  bilaterally. DP and PT pulses are +2. No lower extremity edema. No calf pain  with palpation and negative Homans' sign bilaterally.  Right knee is resting in a 10 degrees valgus deformity. She has full  extension and flexion to 140 degrees with no crepitus. She does have a history of a fractured patella on that side. No effusion, and the knee is   stable to varus and valgus stress. Negative anterior drawer and no pain with  palpation along the joint line. Left knee is resting in a 20 degrees valgus  position. She has full extension but flexion to 115 degrees with a moderate  patellofemoral crepitus. She is acutely tender to palpation on both the  medial lateral joint line, lateral more so than medial. No effusion in the  knee, and she is stable to varus and valgus stress.  NEUROLOGIC: Alert and oriented x3. Cranial nerves II-XII are  grossly intact.  Strength 5/5 bilateral upper and lower extremities. Rapid alternating  movements intact. Deep tendon reflexes 2+ bilateral upper and lower  extremities. Sensation intact to light touch.   RADIOLOGIC FINDINGS:  X-rays taken of her left knee in May 2006 showed total  collapse in the lateral compartment with bone-on-bone articulation sclerosis  and marginal osteophytes on both sides of the joint, but the medial joint  line appears fairly normal.   IMPRESSION:  1.  End-stage osteoarthritis lateral compartment left knee.  2.  Hypertension.  3.  Asthma.  4.  Seasonal allergies.  5.  Hyperlipidemia.  6.  Degenerative disk disease with history of compression fractures and      lumbar vertebroplasty.  7.  Osteoporosis.  8.  Deaf in the right ear.   PLAN:  Ms. [name] will be admitted to [facility] on October 11, 2005, where she will undergo a left total knee arthroplasty with computer  navigation by Dr. Rinaldo Ratel. She will undergo all the routine  preoperative laboratory tests and studies prior to this procedure. If she  has any medical issues while she is hospitalized, we will consult the  hospitalist service for Dr. Rosita Fire E. Duffy, P.A.    ______________________________  Lenard Galloway Chaney Malling, M.D.    KED/MEDQ  D:  10/05/2005  T:  10/05/2005  Job:  [id number]

## 2011-05-06 NOTE — H&P (Signed)
NAME:  Priscilla, Galvan           ACCOUNT NO.:  [id number]   MEDICAL RECORD NO.:  [id number]          PATIENT TYPE:  INP   LOCATION:  NA                           FACILITY:  [facility]   PHYSICIAN:  Rodney A. Mortenson, M.D.DATE OF BIRTH:  1932/10/14   DATE OF ADMISSION:  02/07/2006  DATE OF DISCHARGE:                                HISTORY & PHYSICAL   IDENTIFICATION:  [name] is a 77 year old white married female,  retired.   CHIEF COMPLAINT:  Painful left shoulder.   HISTORY OF PRESENT ILLNESS:  This patient had developed AVN of the left  shoulder without any history of injury or trauma.  The only possible  etiology would be that of having to pick up her husband when he is falling.  The pain is now excruciating and she has failed with conservative treatment.  She is now indicated for hemiarthroplasty of the left shoulder.   PAST MEDICAL HISTORY:  General health is good.   HOSPITALIZATIONS:  1.  Left total knee arthroplasty in 2006.  2.  Kyphoplasties in 2000.   SURGERIES:  As above.   MEDICATIONS:  1.  Norvasc 10 mg daily.  2.  Bisoprolol 5 mg daily.  3.  Allegra 180 mg daily.  4.  Vytorin 10/40 daily.  5.  Lexapro 10 mg daily.  6.  Fosamax 70 mg once weekly.  7.  Os-Cal b.i.d.   ALLERGIES:  None known.   REVIEW OF SYSTEMS:  14-point review of systems positive for hypertension for  the past four to five years as well as the above medication.  She also has  asthma which is apparently under control.  She uses Allegra for her  allergies.   FAMILY HISTORY:  Mother who is deceased from cancer.  Father is deceased  with unknown reason.  She does not have any brothers or sisters.   SOCIAL HISTORY:  She is a very pleasant 77 year old white female who is  married and retired.  She denies use of tobacco or alcohol.   PHYSICAL EXAMINATION:  GENERAL:  77 year old white female well-developed,  well-nourished, alert, pleasant, cooperative in moderate distress  secondary  to left shoulder pain.  VITAL SIGNS:  Temperature 98.6, pulse 18, respirations 60, blood pressure  120/56.  HEENT:  Head is normocephalic.  Eyes:  Pupils are equal, round, and reactive  to light and accommodation with extraocular movements intact.  Ears, nose,  and throat are benign.  NECK:  Supple.  No thyromegaly.  CHEST:  Good expansion.  LUNGS:  Coarse breath sounds bilaterally, but clear.  CARDIAC:  Regular rhythm and rate.  Normal S1, S2.  No discrete murmurs,  rubs, or gallops appreciated.  Pulses 2+ bilateral and symmetric.  No bruits  were noted.  ABDOMEN:  Scaphoid, soft, nontender.  No masses palpable.  Normal bowel  sounds present.  GENITAL:  Not indicated for the procedure.  RECTAL:  Not indicated for the procedure.  BREASTS:  Not indicated for the procedure.  CNS:  Oriented x3.  Cranial nerves II-XII grossly intact.  MUSCULOSKELETAL:  She has range of motion of about 45 degrees of forward  flexion and abduction.  Marked pain with any motion of the shoulder.  Good  strength in biceps and triceps.  Radial pulses 2+.   X-rays reveal AVN left humeral head with a large loose body.   CLINICAL IMPRESSION:  1.  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  2.  History of hypertension.  3.  History of asthma.  4.  Hypercholesterolemia.  5.  Osteoporosis.   RECOMMENDATIONS:  At this time she is indicated for left shoulder  hemiarthroplasty.  Procedures, risks, and benefits have been fully explained  to the patient.  She is totally understanding.      Oris Drone Petrarca, P.A.-C.    ______________________________  Lenard Galloway Chaney Malling, M.D.    BDP/MEDQ  D:  01/30/2006  T:  01/30/2006  Job:  [id number]

## 2011-05-06 NOTE — Op Note (Signed)
NAME:  [name], [name] NO.:  [id number]   MEDICAL RECORD NO.:  [id number]          PATIENT TYPE:  INP   LOCATION:  2899                         FACILITY:  [facility]   PHYSICIAN:  Lenard Galloway. Mortenson, M.D.DATE OF BIRTH:  1932-10-05   DATE OF PROCEDURE:  10/11/2005  DATE OF DISCHARGE:                                 OPERATIVE REPORT   PREOPERATIVE DIAGNOSIS:  Osteoarthritis left knee.   POSTOPERATIVE DIAGNOSIS:  Osteoarthritis left knee.   OPERATION:  Total knee replacement on the left using a standard left  cemented femoral component DePuy, an NBT tibial tray 2.5 cemented with a  cemented standard right patella, bone cement and a 12.5 poly insert standard  size using computer navigation.   ANESTHESIA:  General anesthesia.   SURGEON:  Lenard Galloway. Chaney Malling, M.D.   Threasa HeadsChestine Spore.   DESCRIPTION OF PROCEDURE:  Patient placed on the operating table in the  supine position.  Pneumatic tourniquet about the left upper thigh.  The left  lower extremity was prepped with DuraPrep and draped out in the usual  manner.  Vi-Drape is placed to the operative site.  Leg is wrapped out with  an Esmarch.  Tourniquet was elevated.  Straight incision made and started  about two inches above the patella and carried down to the tibial tubercle.  Skin edges were retracted.  Bleeders were coagulated.  A long medium  parapatellar incision made and patella everted.  The fat pad was removed.  Both medial and lateral meniscus were then excised.  Anterior and posterior  cruciate ligaments were excised.  Bone spurs were removed.  Excellent  exposure was achieved.  At this point, the tibial ray was constructed in the  tibia in a standard fashion and a similar ray was put in position using pin  fixation in a standard fashion. Once this was completed, we started defining  the mechanical axis.  The femoral head center was calculated using the  computer and this prompted [place] through all of the  procedures.  Once the  femoral head center was calculated on the computer, this was stored. Using  the CI pointer, both medial and lateral malleolus were registered.  At this  point, the proximal tibia was exposed.  The pointer was placed at the area  of insertion of the ACL. The medial and lateral borders of the tibia was  defined on the computer following the prompting and once this was completed,  tibial modeling was done over the anterior aspect of the tibia and then  tibial plateau modeling was done using the pointer following the computer  prompting.  This was verified and felt to be extremely accurate.   At this point, we started defining the distal point of the femoral and  pinnacle axis.  Following the prompts the point was placed over the distal  end of the femur.  Both medial and lateral epicondyles were registered.  The  anterior cortex of the femur was registered and then white size line was  registered.  Femoral modeling of the medial and lateral condyles was done  and then modeling anterior femoral cortex  was done and this was all entered  into the computer and verification was done and an excellent modeling was  achieved.  All this data was saved.  Initial leg length was evaluated and  there was significant valgus to the knee.  Using the cutting block adaptor  with a ray, the anterior femoral bow was measured and entered into the  computer in the standard manner.  Next, tibial planning was done.  Dissection level was evaluated.  Using the tibial resection navigation  system, the proximal end of the tibia was verified on a block with fixation  pins.  Using the CapSure guide, the proximal tibia was then excised.  This  is verified using tibial resection verification instrument and excellent  positioning of the cut was achieved.  This was verified on the computer.  Rotation of the distal femur was then done.  Soft tissue balancing was  achieved.  The spacer was put in place  with the knee extended and equal  pressure in lateral and medial compartments was done.  Fair amount of soft  tissue release on the lateral side was done and this corrected the varus a  great deal.  A similar procedure was done with the knee in flexion, soft  tissue balancing was done in this position and the flexion extension gaps  were balanced fairly nicely.  Next, femoral implant planning was done off  the computer following the promptings.  The distal femoral cut was  accomplished using a resection navigation guide and this was pinned in  place.  The distal end of the femur was then amputated.  Once this was  accomplished, it was verified.  The AP femoral resection navigation was then  put on this end of the femur and this set up proper rotation, flexion,  extension and pinning into position using the CapSure guide anterior and  posterior femoral cuts were made.  The final femoral resection guide was  placed over the distal end of the femur, held in position with fixation  pins, drill holes were placed in the distal femur and the chamfer cuts were  made.  All the components had been removed.   At this point the tibia was subluxed anteriorly.  The trial tibial was put  in place, pinned in position and tower applied.  Drill hole was placed in  the proximal tibia.  The tower was then removed and the wing wedge was  inserted and this was locked into position.  It was felt that the 12.5 poly  was the appropriate poly and a trial of 12.5 poly was inserted.  The femoral  component was placed over the distal end of the femur.  Knee was put through  a full range of motion with good balancing of soft tissue in both flexion  and extension.  There was full extension.  Valgus had been markedly  improved. There was no instability.  Attention was then turned to the  posterior aspect of the patella.  Resection guide put in place and posterior aspect of the patella resected and a dural guide was  placed to the posterior  aspect of the patella.  Three drill holes were made and the trial patella  was put in position.  Knee was then put again through full range of motion  and again it was felt that the 12.5 poly was the appropriate component.  All  the components then removed.  The knee was irrigated with copious amounts of  pulsating saline, all  debris was removed. Once this was accomplished, glue  was mixed and final components were placed on the back table.  Each  component was then sequentially inserted, starting with the tibia, inserted  with glue on both sides on the tibia and the tibial component.  This  impacted and excess glue was removed.  Glue was placed over the femoral  component and the anterior aspect of the femur and distal end of the femur.  The femoral component was impacted.  All excess glue was removed.  The trial  poly had been inserted.  The patella was then applied to the posterior  aspect of the patella with glue and this was held in position with the  patella clamp.  All soft excess glue was removed throughout this procedure.  Once the glue had cured, patellar clamp was removed.  Again, the knee was  put through full range of motion and was excellent range of motion, full  extension, full flexion, good stability to varus and valgus stress.  The  trial polyp was then removed.  Tourniquet was dropped.  Bleeders were  coagulated.  Excellent hemostasis was achieved.  The final poly was then  selected and snapped in position and the knee put through a full range of  motion.  The long medial parapatellar incision closed with interrupted  Ethibond sutures.  Vicryl was used to close the subcutaneous tissue and  stainless steel staples were used to close the skin. The hole for the tibial  ray was closed with interrupted 3-0 nylon suture.  Sterile dressings were  applied and the patient returned to the recovery room in excellent  condition.  Technically, this procedure  went extremely well.   DRAINS:  None.   COMPLICATIONS:  None.           ______________________________  Lenard Galloway. Chaney Malling, M.D.     RAM/MEDQ  D:  10/11/2005  T:  10/11/2005  Job:  [id number]

## 2011-05-06 NOTE — Op Note (Signed)
NAME:  [name], [name] NO.:  [id number]   MEDICAL RECORD NO.:  [id number]          PATIENT TYPE:  INP   LOCATION:  5034                         FACILITY:  [facility]   PHYSICIAN:  Lenard Galloway. Mortenson, M.D.DATE OF BIRTH:  [birth year]   DATE OF PROCEDURE:  02/07/2006  DATE OF DISCHARGE:                                 OPERATIVE REPORT   PREOPERATIVE DIAGNOSIS:  Osteochondral fracture, left humeral head.   POSTOPERATIVE DIAGNOSIS:  Osteochondral fracture, left humeral head.   OPERATION PERFORMED:  Hemiarthroplasty, left shoulder using a 10 mm humeral  shaft component with a 42 50 mm humeral head.   SURGEON:  Lenard Galloway. Chaney Malling, M.D.   ASSISTANT:  Legrand Pitts. Duffy, P.A.   ANESTHESIA:  General.   DESCRIPTION OF PROCEDURE:  Patient placed on the operating table in supine  position.  After satisfactory general anesthesia the patient was placed in  semisitting position.  The left upper extremity and shoulder was then  prepped with DuraPrep and draped out in the usual manner.  Incision was made  from the coracoid down along the deltopectoral incision for about 2-1/2 to 3  inches.  Skin edges were retracted.  Bleeders were coagulated.  The  deltopectoral groove was then developed bluntly with fingertips.  The  cephalic vein was swept laterally.  The clavipectoral fascia was opened.  The upper part of the pectoralis was incised to gain more access.  The  bicipital groove was identified.  Incision was made through the  subscapularis down through the capsule about 1 to 1.5 cm medial to the  bicipital groove.  This exposed the humeral head very nicely.  There was a  large osteochondral fracture off the humeral head.  The humeral head  shoulder was dislocated.  A drill hole was placed just medial to the  bicipital groove at the superior part of the humerus.  A series of reamers  was passed down the humeral shaft.  This reamed up to a 10 mm diameter.  There was good cortical  contact.  The guide was placed over the reamer and  this was fed down to the appropriate level of resection of the humeral head.  This was lined up so there was about 30 degrees retroversion.  The humeral  head was then amputated.  Good access to the shoulder joint was now  achieved.  The broach was then removed.  The osteochondral fracture was  found in the axillary recess and this was removed and there was also a large  fragment of cartilage which was floating in the shoulder and this was all  removed.  Series of broaches done that went from 6 mm up to 10 mm, was then  driven down the humeral shaft and seated very nicely on the cut.  An  appropriate size head, trial was then put in the humeral prosthesis and  shoulder reduced.  There was excellent range of motion.  We had full  abduction.  This would translocate about 50% of the joint with anterior  subluxation but would not dislocate.  With the shoulder abducted and  externally rotated, there was no  dislocation of the shoulder and it was felt  to be a perfect fit. All the trials were removed.  Pulsating lavage was  used.  All debris was removed.  The final components were then selected and  the humeral component driven down the shaft and seated very nicely in the  cut part.  The humeral head was then articulated with the humeral prosthetic  shaft.  Shoulder was then reduced and put through a full range of motion  again.  The shoulder was extremely stable with wonderful range of motion.  The subscapularis and capsule were then closed with heavy Tycron sutures.  2-  0 Vicryl  was used to close the subcutaneous tissue and stainless steel staples used  to close the skin.  Sterile dressings were applied and the patient returned  to the recovery room in excellent condition. Technically, this procedure  went extremely well.  I was delighted with the surgical outcome.           ______________________________  Lenard Galloway. Chaney Malling,  M.D.     RAM/MEDQ  D:  02/07/2006  T:  02/08/2006  Job:  [id number]

## 2011-05-06 NOTE — Discharge Summary (Signed)
NAME:  [name], JEFCOAT NO.:  [id number]   MEDICAL RECORD NO.:  [id number]          PATIENT TYPE:  INP   LOCATION:  5034                         FACILITY:  [facility]   PHYSICIAN:  Lenard Galloway. Mortenson, M.D.DATE OF BIRTH:  [birth year]   DATE OF ADMISSION:  02/07/2006  DATE OF DISCHARGE:  02/08/2006                                 DISCHARGE SUMMARY   ADMISSION DIAGNOSES:  1.  Avascular necrosis, left humeral head, with large loose body.  2.  Hypertension.  3.  Asthma.  4.  Hypercholesterolemia.  5.  Osteoporosis.   DISCHARGE DIAGNOSES:  1.  Avascular necrosis, left humeral head, with large loose body, status      post left shoulder hemiarthroplasty.  2.  Hypertension.  3.  Asthma.  4.  Hypercholesterolemia.  5.  Osteoporosis.   SURGICAL PROCEDURE:  On February 07, 2006, Ms. Gritz underwent a left  shoulder hemiarthroplasty by Dr. Rinaldo Ratel assisted by Arnoldo Morale,  P.A.-C.   COMPLICATIONS:  None.   CONSULTS:  Occupational Therapy consult, February 08, 2006.   HISTORY OF PRESENT ILLNESS:  This 77 year old white female patient presented  to Dr. Chaney Malling with a problem with her left shoulder.  She developed  avascular necrosis in that left shoulder with no specific injury or trauma.  She did have to have to pick up her husband one time when he was falling and  wondered if that could have caused it.  She has been having excruciating  pain and has failed conservative treatment; because of this, she is  presenting for a left shoulder hemiarthroplasty.   HOSPITAL COURSE:  She tolerated her surgical procedure well without  immediate postoperative complications.  She was transferred to 5000.  Postop  day 1, she is doing well.  Her pain is controlled with medications.  She  wants to go home.  T-max 100.2, vitals stable.  Hemoglobin 12.5, hematocrit  38, white count 14.6.  Arm is neurovascularly intact.  It is felt she is  ready for discharge home and will  be discharged home today.   DISCHARGE INSTRUCTIONS:   DIET:  She can resume her regular pre-hospitalization diet.   MEDICATIONS:  She may resume her preoperative medications which include:  1.  Norvasc 10 mg one tablet p.o. q.a.m.  2.  Bisoprolol 5 mg one tablet p.o. q.a.m.  3.  Allegra 180 mg one tablet p.o. q.a.m.  4.  Vytorin 10/40 mg one tablet p.o. q.a.m.  5.  Lexapro 10 mg one tablet p.o. q.a.m.  6.  Fosamax 70 mg one tablet p.o. weekly.  7.  __________  one tablet p.o. twice daily.   Additional medications at this time include:  1.  Percocet 5/325 mg one to two tablets p.o. q.4 h. p.r.n. for pain, 50      with no refills.  2.  Ambien 10 mg half to one tablet p.o. nightly p.r.n. for insomnia, 20      with no refill.   ACTIVITY:  She can be out of bed as tolerated, left arm in the sling, except  when doing pendulum exercises; she should do those 2-3  times a day.   WOUND CARE:  She is to change the left shoulder dressing on Friday and clean  the incision with Betadine and apply a dry dressing and tape.  She is to  keep the incision clean and dry.  Notify Dr. Chaney Malling of temperature  greater than 101.5 degrees Fahrenheit, chills, pain unrelieved by pain  medications or foul-smelling drainage from the wound.   FOLLOWUP:  She needs to follow up with Dr. Chaney Malling in our office next  Wednesday, February 15, 2006, and needs to call [phone] for that  appointment.   LABORATORY DATA:  Chest x-ray done in October of 2006 showed COPD and no  chest findings.   White count of February 01, 2006 was 11.5; platelets were 417,000.  On  February 08, 2006, white count 14.6, hemoglobin 12.5, hematocrit 38 and  platelets 371,000.   On February 01, 2006, urinalysis showed clear yellow urine with a small  amount of bilirubin, all other indices within normal limits.      Legrand Pitts Duffy, P.A.    ______________________________  Lenard Galloway Chaney Malling, M.D.    KED/MEDQ  D:  02/08/2006   T:  02/09/2006  Job:  [id number]

## 2011-05-09 ENCOUNTER — Inpatient Hospital Stay (HOSPITAL_COMMUNITY): Admission: RE | Admit: 2011-05-09 | Payer: Self-pay | Source: Ambulatory Visit

## 2011-05-12 ENCOUNTER — Ambulatory Visit: Payer: Self-pay | Admitting: Radiation Oncology

## 2011-05-12 ENCOUNTER — Other Ambulatory Visit: Payer: Self-pay | Admitting: Physical Medicine and Rehabilitation

## 2011-05-12 DIAGNOSIS — M545 Low back pain: Secondary | ICD-10-CM

## 2011-05-12 DIAGNOSIS — M25551 Pain in right hip: Secondary | ICD-10-CM

## 2011-05-13 ENCOUNTER — Ambulatory Visit
Admission: RE | Admit: 2011-05-13 | Discharge: 2011-05-13 | Disposition: A | Discharge status: Home / Self Care | Payer: PRIVATE HEALTH INSURANCE | Source: Ambulatory Visit | Attending: Radiation Oncology | Admitting: Radiation Oncology

## 2011-05-13 ENCOUNTER — Other Ambulatory Visit: Payer: Self-pay | Admitting: Radiation Oncology

## 2011-05-13 DIAGNOSIS — C3410 Malignant neoplasm of upper lobe, unspecified bronchus or lung: Secondary | ICD-10-CM | POA: Insufficient documentation

## 2011-05-13 DIAGNOSIS — C3430 Malignant neoplasm of lower lobe, unspecified bronchus or lung: Secondary | ICD-10-CM | POA: Insufficient documentation

## 2011-05-13 LAB — BUN: BUN: 20 mg/dL (ref 6–23)

## 2011-05-17 ENCOUNTER — Ambulatory Visit (HOSPITAL_COMMUNITY)
Admission: RE | Admit: 2011-05-17 | Discharge: 2011-05-17 | Disposition: A | Discharge status: Home / Self Care | Payer: PRIVATE HEALTH INSURANCE | Source: Ambulatory Visit | Attending: Radiation Oncology | Admitting: Radiation Oncology

## 2011-05-17 ENCOUNTER — Encounter (HOSPITAL_COMMUNITY): Payer: Self-pay

## 2011-05-17 DIAGNOSIS — C3410 Malignant neoplasm of upper lobe, unspecified bronchus or lung: Secondary | ICD-10-CM | POA: Insufficient documentation

## 2011-05-17 DIAGNOSIS — M899 Disorder of bone, unspecified: Secondary | ICD-10-CM | POA: Insufficient documentation

## 2011-05-17 DIAGNOSIS — R079 Chest pain, unspecified: Secondary | ICD-10-CM | POA: Insufficient documentation

## 2011-05-17 DIAGNOSIS — Z923 Personal history of irradiation: Secondary | ICD-10-CM | POA: Insufficient documentation

## 2011-05-17 DIAGNOSIS — M4000 Postural kyphosis, site unspecified: Secondary | ICD-10-CM | POA: Insufficient documentation

## 2011-05-17 DIAGNOSIS — C3490 Malignant neoplasm of unspecified part of unspecified bronchus or lung: Secondary | ICD-10-CM

## 2011-05-17 DIAGNOSIS — R222 Localized swelling, mass and lump, trunk: Secondary | ICD-10-CM | POA: Insufficient documentation

## 2011-05-17 HISTORY — DX: Malignant (primary) neoplasm, unspecified: C80.1

## 2011-05-17 HISTORY — DX: Essential (primary) hypertension: I10

## 2011-05-17 MED ORDER — IOHEXOL 300 MG/ML  SOLN
80.0000 mL | Freq: Once | INTRAMUSCULAR | Status: AC | PRN
  Administered 2011-05-17: 80 mL via INTRAVENOUS

## 2011-05-18 ENCOUNTER — Ambulatory Visit
Admission: RE | Admit: 2011-05-18 | Discharge: 2011-05-18 | Disposition: A | Discharge status: Home / Self Care | Payer: PRIVATE HEALTH INSURANCE | Source: Ambulatory Visit | Attending: Radiation Oncology | Admitting: Radiation Oncology

## 2011-05-19 ENCOUNTER — Other Ambulatory Visit: Payer: Self-pay | Admitting: Radiation Oncology

## 2011-05-19 DIAGNOSIS — C3490 Malignant neoplasm of unspecified part of unspecified bronchus or lung: Secondary | ICD-10-CM

## 2011-05-23 ENCOUNTER — Ambulatory Visit
Admission: RE | Admit: 2011-05-23 | Discharge: 2011-05-23 | Disposition: A | Discharge status: Home / Self Care | Payer: PRIVATE HEALTH INSURANCE | Source: Ambulatory Visit | Attending: Physical Medicine and Rehabilitation | Admitting: Physical Medicine and Rehabilitation

## 2011-05-23 DIAGNOSIS — M545 Low back pain: Secondary | ICD-10-CM

## 2011-05-23 DIAGNOSIS — M25551 Pain in right hip: Secondary | ICD-10-CM

## 2011-09-08 LAB — BASIC METABOLIC PANEL
BUN: 5 — ABNORMAL LOW
BUN: 8
CO2: 21
Calcium: 8.4
Chloride: 102
Chloride: 115 — ABNORMAL HIGH
GFR calc Af Amer: >60
GFR calc Af Amer: >60
GFR calc Af Amer: >60
GFR calc non Af Amer: >60
GFR calc non Af Amer: >60
Potassium: 3.1 — ABNORMAL LOW
Potassium: 3.4 — ABNORMAL LOW
Potassium: 3.5
Potassium: 3.9
Sodium: 138
Sodium: 145

## 2011-09-08 LAB — URINALYSIS, ROUTINE W REFLEX MICROSCOPIC
Bilirubin Urine: NEGATIVE
Glucose, UA: NEGATIVE
Hgb urine dipstick: NEGATIVE
Ketones, ur: NEGATIVE
Nitrite: NEGATIVE
Specific Gravity, Urine: 1.023
pH: 5.5

## 2011-09-08 LAB — CBC
HCT: 35.1 — ABNORMAL LOW
HCT: 39.8
HCT: 39.9
HCT: 45.8
Hemoglobin: 11.5 — ABNORMAL LOW
Hemoglobin: 13.1
Hemoglobin: 15.2 — ABNORMAL HIGH
MCHC: 32.9
MCV: 97.1
Platelets: 246
Platelets: 293
RBC: 3.67 — ABNORMAL LOW
RBC: 4.11
WBC: 14.4 — ABNORMAL HIGH
WBC: 17.5 — ABNORMAL HIGH
WBC: 27.2 — ABNORMAL HIGH

## 2011-09-08 LAB — ETHANOL: Alcohol, Ethyl (B): <5

## 2011-09-08 LAB — DIFFERENTIAL
Eosinophils Absolute: 0.1
Eosinophils Relative: 0
Lymphocytes Relative: 8 — ABNORMAL LOW
Lymphs Abs: 2.1
Monocytes Relative: 2 — ABNORMAL LOW

## 2011-09-08 LAB — POCT I-STAT 3, ART BLOOD GAS (G3+)
Bicarbonate: 21.1
Bicarbonate: 22.9
Operator id: 277331
Operator id: 282221
TCO2: 24
pCO2 arterial: 40.3
pCO2 arterial: 47.8 — ABNORMAL HIGH
pH, Arterial: 7.289 — ABNORMAL LOW
pH, Arterial: 7.352
pO2, Arterial: 291 — ABNORMAL HIGH
pO2, Arterial: 82
pO2, Arterial: 96

## 2011-09-08 LAB — HEPATIC FUNCTION PANEL
Albumin: 3.1 — ABNORMAL LOW
Alkaline Phosphatase: 57
Total Bilirubin: 0.8

## 2011-09-08 LAB — CULTURE, RESPIRATORY W GRAM STAIN: Culture: NORMAL

## 2011-09-08 LAB — CULTURE, BLOOD (ROUTINE X 2): Culture: NO GROWTH

## 2011-09-08 LAB — URINE CULTURE
Colony Count: NO GROWTH
Culture: NO GROWTH

## 2011-09-08 LAB — POCT CARDIAC MARKERS
CKMB, poc: 2.3
Myoglobin, poc: 221
Operator id: 285491

## 2011-09-08 LAB — RAPID URINE DRUG SCREEN, HOSP PERFORMED
Barbiturates: NOT DETECTED
Opiates: POSITIVE — AB

## 2011-09-08 LAB — I-STAT 8, (EC8 V) (CONVERTED LAB)
Chloride: 113 — ABNORMAL HIGH
Glucose, Bld: 187 — ABNORMAL HIGH
Potassium: 4.1
pCO2, Ven: 65.7 — ABNORMAL HIGH
pH, Ven: 7.134 — CL

## 2011-09-08 LAB — POCT I-STAT CREATININE
Creatinine, Ser: 0.9
Operator id: 285491

## 2011-09-08 LAB — CARDIAC PANEL(CRET KIN+CKTOT+MB+TROPI)
CK, MB: 10 — ABNORMAL HIGH
Relative Index: 2.4
Troponin I: 0.05

## 2011-09-08 LAB — PHOSPHORUS: Phosphorus: 2.2 — ABNORMAL LOW

## 2011-09-08 LAB — LACTIC ACID, PLASMA: Lactic Acid, Venous: 1.5

## 2011-09-08 LAB — MAGNESIUM: Magnesium: 1.9

## 2011-09-08 LAB — PROTIME-INR: Prothrombin Time: 12.6

## 2011-09-15 ENCOUNTER — Other Ambulatory Visit (HOSPITAL_COMMUNITY): Payer: PRIVATE HEALTH INSURANCE

## 2011-09-16 ENCOUNTER — Ambulatory Visit: Payer: PRIVATE HEALTH INSURANCE | Admitting: Radiation Oncology

## 2011-09-21 ENCOUNTER — Ambulatory Visit
Admission: RE | Admit: 2011-09-21 | Discharge: 2011-09-21 | Disposition: A | Discharge status: Home / Self Care | Payer: PRIVATE HEALTH INSURANCE | Source: Ambulatory Visit | Attending: Radiation Oncology | Admitting: Radiation Oncology

## 2011-09-21 LAB — ACETAMINOPHEN LEVEL: Acetaminophen (Tylenol), Serum: <10 — ABNORMAL LOW

## 2011-09-21 LAB — URINALYSIS, ROUTINE W REFLEX MICROSCOPIC
Ketones, ur: NEGATIVE
Nitrite: NEGATIVE
Protein, ur: NEGATIVE
Urobilinogen, UA: 0.2
pH: 5

## 2011-09-21 LAB — COMPREHENSIVE METABOLIC PANEL
ALT: 12
CO2: 22
Calcium: 9
Chloride: 108
Creatinine, Ser: 0.54
GFR calc non Af Amer: >60
Glucose, Bld: 109 — ABNORMAL HIGH
Sodium: 137
Total Bilirubin: 1.1

## 2011-09-21 LAB — BUN: BUN: 15 mg/dL (ref 6–23)

## 2011-09-21 LAB — CBC
Hemoglobin: 13.5
MCHC: 33.7
MCV: 95.2
RBC: 4.21

## 2011-09-21 LAB — CREATININE, SERUM: Creatinine, Ser: 0.68 mg/dL (ref 0.50–1.10)

## 2011-09-21 LAB — POCT CARDIAC MARKERS
CKMB, poc: 4.1
Myoglobin, poc: 136
Troponin i, poc: <0.05

## 2011-09-21 LAB — DIFFERENTIAL
Basophils Absolute: 0.2 — ABNORMAL HIGH
Eosinophils Absolute: 0
Lymphs Abs: 1.4
Neutrophils Relative %: 84 — ABNORMAL HIGH

## 2011-09-21 LAB — RAPID URINE DRUG SCREEN, HOSP PERFORMED
Cocaine: NOT DETECTED
Tetrahydrocannabinol: NOT DETECTED

## 2011-09-23 ENCOUNTER — Ambulatory Visit (HOSPITAL_COMMUNITY)
Admission: RE | Admit: 2011-09-23 | Discharge: 2011-09-23 | Disposition: A | Discharge status: Home / Self Care | Payer: PRIVATE HEALTH INSURANCE | Source: Ambulatory Visit | Attending: Radiation Oncology | Admitting: Radiation Oncology

## 2011-09-23 DIAGNOSIS — C3490 Malignant neoplasm of unspecified part of unspecified bronchus or lung: Secondary | ICD-10-CM | POA: Insufficient documentation

## 2011-09-23 DIAGNOSIS — J984 Other disorders of lung: Secondary | ICD-10-CM | POA: Insufficient documentation

## 2011-09-23 DIAGNOSIS — J438 Other emphysema: Secondary | ICD-10-CM | POA: Insufficient documentation

## 2011-09-23 DIAGNOSIS — I2789 Other specified pulmonary heart diseases: Secondary | ICD-10-CM | POA: Insufficient documentation

## 2011-09-23 DIAGNOSIS — E278 Other specified disorders of adrenal gland: Secondary | ICD-10-CM | POA: Insufficient documentation

## 2011-09-23 MED ORDER — IOHEXOL 300 MG/ML  SOLN
50.0000 mL | Freq: Once | INTRAMUSCULAR | Status: AC | PRN
  Administered 2011-09-23: 50 mL via INTRAVENOUS

## 2011-09-29 LAB — BASIC METABOLIC PANEL
BUN: 10
Calcium: 9.3
Creatinine, Ser: 0.45
GFR calc non Af Amer: >60
Glucose, Bld: 83

## 2011-09-29 LAB — CBC
Platelets: 386
RDW: 15 — ABNORMAL HIGH
WBC: 14.4 — ABNORMAL HIGH

## 2011-09-29 LAB — PROTIME-INR: Prothrombin Time: 12.1

## 2011-09-30 ENCOUNTER — Ambulatory Visit
Admission: RE | Admit: 2011-09-30 | Discharge: 2011-09-30 | Disposition: A | Discharge status: Home / Self Care | Payer: PRIVATE HEALTH INSURANCE | Source: Ambulatory Visit | Attending: Radiation Oncology | Admitting: Radiation Oncology

## 2011-10-06 ENCOUNTER — Other Ambulatory Visit: Payer: Self-pay | Admitting: Radiation Oncology

## 2011-10-06 DIAGNOSIS — C3490 Malignant neoplasm of unspecified part of unspecified bronchus or lung: Secondary | ICD-10-CM

## 2012-01-30 ENCOUNTER — Other Ambulatory Visit: Payer: Self-pay | Admitting: Radiation Oncology

## 2012-01-30 ENCOUNTER — Ambulatory Visit
Admission: RE | Admit: 2012-01-30 | Discharge: 2012-01-30 | Disposition: A | Discharge status: Home / Self Care | Payer: PRIVATE HEALTH INSURANCE | Source: Ambulatory Visit | Attending: Radiation Oncology | Admitting: Radiation Oncology

## 2012-01-30 DIAGNOSIS — C3490 Malignant neoplasm of unspecified part of unspecified bronchus or lung: Secondary | ICD-10-CM

## 2012-01-30 LAB — CREATININE, SERUM: Creatinine, Ser: 0.56 mg/dL (ref 0.50–1.10)

## 2012-01-30 LAB — BUN: BUN: 18 mg/dL (ref 6–23)

## 2012-02-01 ENCOUNTER — Ambulatory Visit (HOSPITAL_COMMUNITY)
Admission: RE | Admit: 2012-02-01 | Discharge: 2012-02-01 | Disposition: A | Discharge status: Home / Self Care | Payer: PRIVATE HEALTH INSURANCE | Source: Ambulatory Visit | Attending: Radiation Oncology | Admitting: Radiation Oncology

## 2012-02-01 DIAGNOSIS — C3490 Malignant neoplasm of unspecified part of unspecified bronchus or lung: Secondary | ICD-10-CM | POA: Insufficient documentation

## 2012-02-01 DIAGNOSIS — X58XXXA Exposure to other specified factors, initial encounter: Secondary | ICD-10-CM | POA: Insufficient documentation

## 2012-02-01 DIAGNOSIS — I2510 Atherosclerotic heart disease of native coronary artery without angina pectoris: Secondary | ICD-10-CM | POA: Insufficient documentation

## 2012-02-01 DIAGNOSIS — I080 Rheumatic disorders of both mitral and aortic valves: Secondary | ICD-10-CM | POA: Insufficient documentation

## 2012-02-01 DIAGNOSIS — K838 Other specified diseases of biliary tract: Secondary | ICD-10-CM | POA: Insufficient documentation

## 2012-02-01 DIAGNOSIS — S32009A Unspecified fracture of unspecified lumbar vertebra, initial encounter for closed fracture: Secondary | ICD-10-CM | POA: Insufficient documentation

## 2012-02-01 DIAGNOSIS — N200 Calculus of kidney: Secondary | ICD-10-CM | POA: Insufficient documentation

## 2012-02-01 DIAGNOSIS — S22009A Unspecified fracture of unspecified thoracic vertebra, initial encounter for closed fracture: Secondary | ICD-10-CM | POA: Insufficient documentation

## 2012-02-01 MED ORDER — IOHEXOL 300 MG/ML  SOLN
50.0000 mL | Freq: Once | INTRAMUSCULAR | Status: AC | PRN
  Administered 2012-02-01: 50 mL via INTRAVENOUS

## 2012-02-02 ENCOUNTER — Encounter: Payer: Self-pay | Admitting: *Deleted

## 2012-02-02 DIAGNOSIS — C3490 Malignant neoplasm of unspecified part of unspecified bronchus or lung: Secondary | ICD-10-CM | POA: Insufficient documentation

## 2012-02-03 ENCOUNTER — Encounter: Payer: Self-pay | Admitting: Radiation Oncology

## 2012-02-03 ENCOUNTER — Ambulatory Visit
Admission: RE | Admit: 2012-02-03 | Discharge: 2012-02-03 | Disposition: A | Discharge status: Home / Self Care | Payer: PRIVATE HEALTH INSURANCE | Source: Ambulatory Visit | Attending: Radiation Oncology | Admitting: Radiation Oncology

## 2012-02-03 DIAGNOSIS — C3490 Malignant neoplasm of unspecified part of unspecified bronchus or lung: Secondary | ICD-10-CM

## 2012-02-03 NOTE — Progress Notes (Signed)
Patient presents to the clinic today accompanied by a family member for a follow up appointment with Dr. Mitzi Hansen for CT results. Patient is alert and oriented to person, place, and time. No distress noted. Steady gait noted. Pleasant affect noted. Patient reports constant aching low back pain 7 on a scale of 0-10 for which she takes Tramadol. Patient reports diarrhea related to diverticulitis. Patient denies nausea, vomiting, dizziness or headache. Patient denies cough or shortness of breath. Reported all findings to Dr. Mitzi Hansen.

## 2012-02-03 NOTE — Progress Notes (Signed)
Patient reports also taking blood pressure and thyroid pill but is unsure of the name. Encouraged patient to bring a list of her medications the next time she comes in for an appointment.

## 2012-02-07 ENCOUNTER — Other Ambulatory Visit: Payer: Self-pay | Admitting: Family Medicine

## 2012-02-07 DIAGNOSIS — R0989 Other specified symptoms and signs involving the circulatory and respiratory systems: Secondary | ICD-10-CM

## 2012-02-09 ENCOUNTER — Ambulatory Visit
Admission: RE | Admit: 2012-02-09 | Discharge: 2012-02-09 | Disposition: A | Discharge status: Home / Self Care | Payer: Medicare Other | Source: Ambulatory Visit | Attending: Family Medicine | Admitting: Family Medicine

## 2012-02-09 DIAGNOSIS — R0989 Other specified symptoms and signs involving the circulatory and respiratory systems: Secondary | ICD-10-CM

## 2012-02-09 NOTE — Progress Notes (Signed)
CC:   Kalman Shan, MD Warrick Parisian, MD  DIAGNOSIS:  Non-small cell lung cancer of both the upper and left lower lung.  INTERVAL HISTORY:  Ms. Tantillo returns to clinic today for ongoing followup.  She states that she has done well.  She relates no significant change in shortness of breath.  She denies any cough, esophagitis or other chest pain.  The patient had a CT scan of the chest completed recently on 02/01/2012.  Some radiation change was noted without any definitive evidence of recurrence or progressive disease.  PHYSICAL EXAM:  Weight 88 pounds, blood pressure 129/70, pulse 58, temperature 97.1, respiratory rate 20.  Gen:  Well-developed female in no acute distress.  Neck:  Supple without any lymphadenopathy. Cardiovascular:  Regular rate and rhythm.  Respiratory:  Clear to auscultation.  IMPRESSION/PLAN:  Ms. Difrancesco is doing well and her CT scan looked good with no sign of recurrence.  I will have her return to clinic in 6 months after undergoing a follow up CT scan of the chest.  I spent 10 minutes with Ms. Sambrano today, the majority of which was spent counseling her on her diagnosis and coordinating her care.    ______________________________ Radene Gunning, M.D., Ph.D. JSM/MEDQ  D:  02/09/2012  T:  02/09/2012  Job:  [id number]

## 2012-02-13 ENCOUNTER — Other Ambulatory Visit: Payer: Self-pay | Admitting: Family Medicine

## 2012-02-13 DIAGNOSIS — E041 Nontoxic single thyroid nodule: Secondary | ICD-10-CM

## 2012-02-15 ENCOUNTER — Ambulatory Visit
Admission: RE | Admit: 2012-02-15 | Discharge: 2012-02-15 | Disposition: A | Discharge status: Home / Self Care | Payer: Medicare Other | Source: Ambulatory Visit | Attending: Family Medicine | Admitting: Family Medicine

## 2012-02-15 DIAGNOSIS — E041 Nontoxic single thyroid nodule: Secondary | ICD-10-CM

## 2012-02-16 ENCOUNTER — Other Ambulatory Visit: Payer: Self-pay | Admitting: Family Medicine

## 2012-02-16 DIAGNOSIS — E041 Nontoxic single thyroid nodule: Secondary | ICD-10-CM

## 2012-02-28 ENCOUNTER — Ambulatory Visit
Admission: RE | Admit: 2012-02-28 | Discharge: 2012-02-28 | Disposition: A | Discharge status: Home / Self Care | Payer: Medicare Other | Source: Ambulatory Visit | Attending: Family Medicine | Admitting: Family Medicine

## 2012-02-28 ENCOUNTER — Other Ambulatory Visit (HOSPITAL_COMMUNITY)
Admission: RE | Admit: 2012-02-28 | Discharge: 2012-02-28 | Disposition: A | Discharge status: Home / Self Care | Payer: Medicare Other | Source: Ambulatory Visit | Attending: Interventional Radiology | Admitting: Interventional Radiology

## 2012-02-28 DIAGNOSIS — E049 Nontoxic goiter, unspecified: Secondary | ICD-10-CM | POA: Insufficient documentation

## 2012-02-28 DIAGNOSIS — E041 Nontoxic single thyroid nodule: Secondary | ICD-10-CM

## 2012-09-11 ENCOUNTER — Other Ambulatory Visit (HOSPITAL_BASED_OUTPATIENT_CLINIC_OR_DEPARTMENT_OTHER): Payer: Self-pay | Admitting: Family Medicine

## 2012-09-11 DIAGNOSIS — R079 Chest pain, unspecified: Secondary | ICD-10-CM

## 2012-09-12 ENCOUNTER — Ambulatory Visit (HOSPITAL_BASED_OUTPATIENT_CLINIC_OR_DEPARTMENT_OTHER)
Admission: RE | Admit: 2012-09-12 | Discharge: 2012-09-12 | Disposition: A | Discharge status: Home / Self Care | Payer: Medicare Other | Source: Ambulatory Visit | Attending: Family Medicine | Admitting: Family Medicine

## 2012-09-12 DIAGNOSIS — J438 Other emphysema: Secondary | ICD-10-CM | POA: Insufficient documentation

## 2012-09-12 DIAGNOSIS — D3500 Benign neoplasm of unspecified adrenal gland: Secondary | ICD-10-CM | POA: Insufficient documentation

## 2012-09-12 DIAGNOSIS — R079 Chest pain, unspecified: Secondary | ICD-10-CM | POA: Insufficient documentation

## 2012-09-12 DIAGNOSIS — N200 Calculus of kidney: Secondary | ICD-10-CM | POA: Insufficient documentation

## 2012-09-12 DIAGNOSIS — R911 Solitary pulmonary nodule: Secondary | ICD-10-CM | POA: Insufficient documentation

## 2012-10-01 ENCOUNTER — Encounter (HOSPITAL_COMMUNITY): Payer: Self-pay | Admitting: Surgery

## 2012-10-01 ENCOUNTER — Inpatient Hospital Stay (HOSPITAL_COMMUNITY)
Admission: AD | Admit: 2012-10-01 | Discharge: 2012-10-03 | DRG: 372 | Disposition: A | Discharge status: Home / Self Care | Payer: Medicare Other | Source: Ambulatory Visit | Attending: Internal Medicine | Admitting: Internal Medicine

## 2012-10-01 DIAGNOSIS — E039 Hypothyroidism, unspecified: Secondary | ICD-10-CM | POA: Diagnosis present

## 2012-10-01 DIAGNOSIS — K5289 Other specified noninfective gastroenteritis and colitis: Secondary | ICD-10-CM

## 2012-10-01 DIAGNOSIS — M549 Dorsalgia, unspecified: Secondary | ICD-10-CM | POA: Diagnosis present

## 2012-10-01 DIAGNOSIS — J309 Allergic rhinitis, unspecified: Secondary | ICD-10-CM

## 2012-10-01 DIAGNOSIS — C3490 Malignant neoplasm of unspecified part of unspecified bronchus or lung: Secondary | ICD-10-CM

## 2012-10-01 DIAGNOSIS — K529 Noninfective gastroenteritis and colitis, unspecified: Secondary | ICD-10-CM

## 2012-10-01 DIAGNOSIS — J984 Other disorders of lung: Secondary | ICD-10-CM

## 2012-10-01 DIAGNOSIS — M810 Age-related osteoporosis without current pathological fracture: Secondary | ICD-10-CM

## 2012-10-01 DIAGNOSIS — I10 Essential (primary) hypertension: Secondary | ICD-10-CM | POA: Diagnosis present

## 2012-10-01 DIAGNOSIS — E876 Hypokalemia: Secondary | ICD-10-CM | POA: Diagnosis present

## 2012-10-01 DIAGNOSIS — G8929 Other chronic pain: Secondary | ICD-10-CM

## 2012-10-01 DIAGNOSIS — A0472 Enterocolitis due to Clostridium difficile, not specified as recurrent: Principal | ICD-10-CM | POA: Diagnosis present

## 2012-10-01 DIAGNOSIS — F329 Major depressive disorder, single episode, unspecified: Secondary | ICD-10-CM

## 2012-10-01 DIAGNOSIS — J45909 Unspecified asthma, uncomplicated: Secondary | ICD-10-CM

## 2012-10-01 DIAGNOSIS — E860 Dehydration: Secondary | ICD-10-CM | POA: Diagnosis present

## 2012-10-01 DIAGNOSIS — E049 Nontoxic goiter, unspecified: Secondary | ICD-10-CM

## 2012-10-01 DIAGNOSIS — Z85118 Personal history of other malignant neoplasm of bronchus and lung: Secondary | ICD-10-CM

## 2012-10-01 DIAGNOSIS — B370 Candidal stomatitis: Secondary | ICD-10-CM | POA: Diagnosis present

## 2012-10-01 DIAGNOSIS — R112 Nausea with vomiting, unspecified: Secondary | ICD-10-CM | POA: Diagnosis present

## 2012-10-01 DIAGNOSIS — E785 Hyperlipidemia, unspecified: Secondary | ICD-10-CM

## 2012-10-01 DIAGNOSIS — J441 Chronic obstructive pulmonary disease with (acute) exacerbation: Secondary | ICD-10-CM

## 2012-10-01 DIAGNOSIS — R079 Chest pain, unspecified: Secondary | ICD-10-CM

## 2012-10-01 DIAGNOSIS — J449 Chronic obstructive pulmonary disease, unspecified: Secondary | ICD-10-CM | POA: Diagnosis present

## 2012-10-01 DIAGNOSIS — J4489 Other specified chronic obstructive pulmonary disease: Secondary | ICD-10-CM | POA: Diagnosis present

## 2012-10-01 DIAGNOSIS — M1990 Unspecified osteoarthritis, unspecified site: Secondary | ICD-10-CM

## 2012-10-01 DIAGNOSIS — F17200 Nicotine dependence, unspecified, uncomplicated: Secondary | ICD-10-CM

## 2012-10-01 LAB — CBC
MCHC: 33.6 g/dL (ref 30.0–36.0)
Platelets: 497 10*3/uL — ABNORMAL HIGH (ref 150–400)
RDW: 13 % (ref 11.5–15.5)
WBC: 20 10*3/uL — ABNORMAL HIGH (ref 4.0–10.5)

## 2012-10-01 LAB — COMPREHENSIVE METABOLIC PANEL
ALT: <5 U/L (ref 0–35)
Alkaline Phosphatase: 126 U/L — ABNORMAL HIGH (ref 39–117)
BUN: 15 mg/dL (ref 6–23)
CO2: 18 mEq/L — ABNORMAL LOW (ref 19–32)
GFR calc Af Amer: >90 mL/min (ref >90)
GFR calc non Af Amer: >90 mL/min (ref >90)
Glucose, Bld: 91 mg/dL (ref 70–99)
Potassium: 3.4 mEq/L — ABNORMAL LOW (ref 3.5–5.1)
Sodium: 136 mEq/L (ref 135–145)
Total Bilirubin: 0.2 mg/dL — ABNORMAL LOW (ref 0.3–1.2)

## 2012-10-01 LAB — MAGNESIUM: Magnesium: 1.9 mg/dL (ref 1.5–2.5)

## 2012-10-01 LAB — LIPASE, BLOOD: Lipase: 11 U/L (ref 11–59)

## 2012-10-01 MED ORDER — SODIUM CHLORIDE 0.9 % IJ SOLN
3.0000 mL | Freq: Two times a day (BID) | INTRAMUSCULAR | Status: DC
  Administered 2012-10-01: 3 mL via INTRAVENOUS

## 2012-10-01 MED ORDER — HYDRALAZINE HCL 20 MG/ML IJ SOLN: 10.0000 mg | Freq: Four times a day (QID) | INTRAMUSCULAR | Status: DC | PRN

## 2012-10-01 MED ORDER — FAMOTIDINE IN NACL 20-0.9 MG/50ML-% IV SOLN
20.0000 mg | Freq: Two times a day (BID) | INTRAVENOUS | Status: DC
  Administered 2012-10-01 – 2012-10-03 (×4): 20 mg via INTRAVENOUS
  Filled 2012-10-01 (×7): qty 50

## 2012-10-01 MED ORDER — SODIUM CHLORIDE 0.9 % IV SOLN: 250.0000 mL | INTRAVENOUS | Status: DC | PRN

## 2012-10-01 MED ORDER — METRONIDAZOLE IN NACL 5-0.79 MG/ML-% IV SOLN
500.0000 mg | Freq: Three times a day (TID) | INTRAVENOUS | Status: DC
  Administered 2012-10-01 – 2012-10-02 (×3): 500 mg via INTRAVENOUS
  Filled 2012-10-01 (×4): qty 100

## 2012-10-01 MED ORDER — MORPHINE SULFATE 2 MG/ML IJ SOLN: 2.0000 mg | INTRAMUSCULAR | Status: DC | PRN

## 2012-10-01 MED ORDER — ACETAMINOPHEN 650 MG RE SUPP: 650.0000 mg | Freq: Four times a day (QID) | RECTAL | Status: DC | PRN

## 2012-10-01 MED ORDER — FENTANYL 50 MCG/HR TD PT72
50.0000 ug | MEDICATED_PATCH | TRANSDERMAL | Status: DC
  Administered 2012-10-01: 50 ug via TRANSDERMAL
  Filled 2012-10-01: qty 1

## 2012-10-01 MED ORDER — SODIUM CHLORIDE 0.9 % IV SOLN
INTRAVENOUS | Status: DC
  Administered 2012-10-01: 21:00:00 via INTRAVENOUS

## 2012-10-01 MED ORDER — FLUCONAZOLE 100MG IVPB
100.0000 mg | INTRAVENOUS | Status: DC
Start: 2012-10-01 — End: 2012-10-03
  Administered 2012-10-01 – 2012-10-02 (×2): 100 mg via INTRAVENOUS
  Filled 2012-10-01 (×3): qty 50

## 2012-10-01 MED ORDER — ACETAMINOPHEN 325 MG PO TABS: 650.0000 mg | ORAL_TABLET | Freq: Four times a day (QID) | ORAL | Status: DC | PRN

## 2012-10-01 MED ORDER — ONDANSETRON HCL 4 MG/2ML IJ SOLN: 4.0000 mg | Freq: Four times a day (QID) | INTRAMUSCULAR | Status: DC | PRN

## 2012-10-01 MED ORDER — TIOTROPIUM BROMIDE MONOHYDRATE 18 MCG IN CAPS
18.0000 ug | ORAL_CAPSULE | Freq: Every day | RESPIRATORY_TRACT | Status: DC
  Administered 2012-10-02 – 2012-10-03 (×2): 18 ug via RESPIRATORY_TRACT
  Filled 2012-10-01: qty 5

## 2012-10-01 MED ORDER — HEPARIN SODIUM (PORCINE) 5000 UNIT/ML IJ SOLN
5000.0000 [IU] | Freq: Three times a day (TID) | INTRAMUSCULAR | Status: DC
  Administered 2012-10-01 – 2012-10-03 (×5): 5000 [IU] via SUBCUTANEOUS
  Filled 2012-10-01 (×8): qty 1

## 2012-10-01 MED ORDER — OXYCODONE HCL 5 MG PO TABS
5.0000 mg | ORAL_TABLET | ORAL | Status: DC | PRN
  Administered 2012-10-02 – 2012-10-03 (×4): 5 mg via ORAL
  Filled 2012-10-01 (×4): qty 1

## 2012-10-01 MED ORDER — SODIUM CHLORIDE 0.9 % IJ SOLN: 3.0000 mL | INTRAMUSCULAR | Status: DC | PRN

## 2012-10-01 MED ORDER — BUDESONIDE-FORMOTEROL FUMARATE 160-4.5 MCG/ACT IN AERO
2.0000 | INHALATION_SPRAY | Freq: Two times a day (BID) | RESPIRATORY_TRACT | Status: DC
  Administered 2012-10-01 – 2012-10-03 (×4): 2 via RESPIRATORY_TRACT
  Filled 2012-10-01: qty 6

## 2012-10-01 MED ORDER — INFLUENZA VIRUS VACC SPLIT PF IM SUSP
0.5000 mL | INTRAMUSCULAR | Status: AC
  Filled 2012-10-01: qty 0.5

## 2012-10-01 MED ORDER — LEVOTHYROXINE SODIUM 100 MCG IV SOLR
12.5000 ug | Freq: Every day | INTRAVENOUS | Status: DC
  Administered 2012-10-01 – 2012-10-03 (×3): 12.5 ug via INTRAVENOUS
  Filled 2012-10-01 (×4): qty 0.63

## 2012-10-01 MED ORDER — ONDANSETRON HCL 4 MG PO TABS: 4.0000 mg | ORAL_TABLET | Freq: Four times a day (QID) | ORAL | Status: DC | PRN

## 2012-10-01 MED ORDER — ALBUTEROL SULFATE (5 MG/ML) 0.5% IN NEBU: 2.5000 mg | INHALATION_SOLUTION | RESPIRATORY_TRACT | Status: DC | PRN

## 2012-10-01 NOTE — Progress Notes (Signed)
Pt arrived to floor in stable condition, accompanied by son.  Made pt comfortable in bed, no complaints at this time.  Notified MD by page.  Awaiting MD orders.

## 2012-10-01 NOTE — H&P (Signed)
PCP:   [name], MD   Chief Complaint:   Persistent vomiting and diarrhea.   HPI: This is a 78 year old female, with known history of HTN, depression, COPD, hypothyroidism, OA, s/p left knee replacement, s/p left artificial shoulder, osteoporosis/vertebral fractures,  s/p vertebroplasty x 2, chronic back pain on long-term opioid medication, diverticulosis, non-small cell carcinoma of LUL/LLL s/p XRT,, s/p right cataract surgery 09/24/12, presenting with above symptoms. According to patient, and office notes obtained from primary provider, [name], PA, patient is a resident of [address] [facility], where there has been a "stomach virus" circulating for the past few week, with other residents having vomiting and diarrhea. As a matter of fact, her house keeper, has a diarrheal illness. Patient had suffered a cat scratch wound on her right ankle about 2 weeks ago, which appeared to have become infected, so had been started on oral Augumentin by her PMD about a week ago. The wound had healed, but but the antibiotics made her nauseated and caused vomiting, so she discontinued it on 107/13, after only 3 days of treatment. She felt fine on 09/25/12, and even went out to dinner with her son. On waking up in AM of 09/26/12, she developed nausea, crampy upper abdominal pain, and diarrhea about 4-5 times per day. Stools are watery and dark. She has become progressively weak, called her PMD on 10/01/12, was seen in the office by PA, [name], and referred for direct admission. She denies fever or chills.    Allergies:   Allergies  Allergen Reactions  . Aspirin     REACTION: gi upset      Past Medical History  Diagnosis Date  . lung ca dx'd 08/2010    xrt comp 10/2010  . Hypertension   . Lung cancer     LUL, LLL lung  . Osteoporosis   . Diverticulitis   . Chronic back pain     History reviewed. No pertinent past surgical history.  Prior to Admission medications   Medication Sig  Start Date End Date Taking? Authorizing Provider  fexofenadine (ALLEGRA) 180 MG tablet Take 180 mg by mouth daily.    Historical Provider, MD  psyllium (METAMUCIL) 58.6 % packet Take 1 packet by mouth daily.    Historical Provider, MD    Social History: Patient is married, has one son, and is a resident of [facility]. Ex-smoker, not currently a smoking.  She does not have any smokeless tobacco history on file. Her alcohol and drug histories not on file.   Family History  Problem Relation Age of Onset  . Cancer Mother     lung  . Cancer Father     stomach    Review of Systems:  As per HPI and chief complaint. Patent admits to  fatigue, diminished appetite, denies weight loss, fever, chills, headache, blurred vision, difficulty in speaking, dysphagia, chest pain, cough, shortness of breath, orthopnea, paroxysmal nocturnal dyspnea, nausea, diaphoresis, belching, heartburn, hematemesis, melena, dysuria, nocturia, urinary frequency, hematochezia, lower extremity swelling, pain, or redness. The rest of the systems review is negative.  Physical Exam:  General:  Patient does not appear to be in obvious acute distress. Alert, communicative, fully oriented, talking in complete sentences, not short of breath at rest.  HEENT:  No clinical pallor, no jaundice, no conjunctival injection or discharge. Buccal mucosa appears "dry". She also has mild oral thrush.  NECK:  Supple, JVP not seen, no carotid bruits, no palpable lymphadenopathy, no palpable goiter.  CHEST:  Clinically clear to auscultation, no wheezes, no crackles. HEART:  Sounds 1 and 2 heard, normal, regular, no murmurs. ABDOMEN:  Flat, soft, diffuse mild discomfort, non-tender, no palpable organomegaly, no palpable masses, brisk bowel sounds. GENITALIA:  Not examined. LOWER EXTREMITIES:  No pitting edema, palpable peripheral pulses. Healed cat scratch wounds, medial and posterior aspect of right ankle.  MUSCULOSKELETAL SYSTEM:   Generalized osteoarthritic changes. CENTRAL NERVOUS SYSTEM:  No focal neurologic deficit on gross examination.  Labs on Admission:  No results found for this or any previous visit (from the past 48 hour(s)).  Radiological Exams on Admission: No results found.  Assessment/Plan Active Problems:  1. Acute Gastroenteritis: Patient presented with 6 days of vomiting and diarrhea, in the setting of recent antibiotic therapy, as well as clustering of diarrheal illness cases in the community, consistent with an acute gastroenteritis. The likely culprit is a viral etiology, but C. Difficile infection clearly has to be ruled out. Will manage with bowel rest, ivi fluids, H2RA, and antiemetics. Lipase will be ordered for completeness, stool studies sent off for C. Difficile PCR, and empiric Flagyl, commenced.  2. Dehydration: Patient appears clinically dehydrated, although no laboratory studies are available at this time. Will manage with iv fluids, and check lytes, and address any abnormalities.  3. Oral thrush: This is an incidental finding on physical examination, and will be managed with a 7-day course of Diflucan.  4. HTN (hypertension): BP is sub-optimally controlled at this time. Will address with parenteral antihypertensives, until oral intake is moe reliable.  5. Hypothyroidism: Continued on thyroxine replacement therapy.  6. Chronic back pain: Not problematic at this time.  7. COPD: Stable/Asymptomatic on bronchodilators.   8. Lung cancer: Treated with radiation, and appears stable.   Further management will depend on clinical course.    Comment:  Main contact is son, Malachi Paradise (Tel: [phone]). He has confirmed that patient is FULL CODE.    Time Spent on Admission: 40 mins.   Miriana Gaertner,CHRISTOPHER 10/01/2012, 7:41 PM

## 2012-10-02 DIAGNOSIS — E876 Hypokalemia: Secondary | ICD-10-CM | POA: Diagnosis present

## 2012-10-02 DIAGNOSIS — J449 Chronic obstructive pulmonary disease, unspecified: Secondary | ICD-10-CM | POA: Diagnosis present

## 2012-10-02 LAB — CBC
Hemoglobin: 12.9 g/dL (ref 12.0–15.0)
MCH: 33.6 pg (ref 26.0–34.0)
MCV: 98.4 fL (ref 78.0–100.0)
RBC: 3.84 MIL/uL — ABNORMAL LOW (ref 3.87–5.11)

## 2012-10-02 LAB — COMPREHENSIVE METABOLIC PANEL
AST: 8 U/L (ref 0–37)
Albumin: 2.6 g/dL — ABNORMAL LOW (ref 3.5–5.2)
Calcium: 8.1 mg/dL — ABNORMAL LOW (ref 8.4–10.5)
Creatinine, Ser: 0.47 mg/dL — ABNORMAL LOW (ref 0.50–1.10)
GFR calc non Af Amer: >90 mL/min (ref >90)

## 2012-10-02 LAB — MAGNESIUM: Magnesium: 1.8 mg/dL (ref 1.5–2.5)

## 2012-10-02 LAB — CLOSTRIDIUM DIFFICILE BY PCR: Toxigenic C. Difficile by PCR: POSITIVE — AB

## 2012-10-02 MED ORDER — VANCOMYCIN 50 MG/ML ORAL SOLUTION
125.0000 mg | Freq: Four times a day (QID) | ORAL | Status: DC
  Administered 2012-10-02 – 2012-10-03 (×4): 125 mg via ORAL
  Filled 2012-10-02 (×9): qty 2.5

## 2012-10-02 MED ORDER — POTASSIUM CHLORIDE IN NACL 40-0.9 MEQ/L-% IV SOLN
INTRAVENOUS | Status: DC
  Administered 2012-10-02 – 2012-10-03 (×2): via INTRAVENOUS
  Filled 2012-10-02 (×4): qty 1000

## 2012-10-02 MED ORDER — TRAZODONE HCL 50 MG PO TABS
50.0000 mg | ORAL_TABLET | Freq: Once | ORAL | Status: AC
  Administered 2012-10-02: 50 mg via ORAL
  Filled 2012-10-02: qty 1

## 2012-10-02 NOTE — Progress Notes (Signed)
[facility] PROGRESS NOTE  [name]:[id number] DOB: [birth year] DOA: 10/01/2012 PCP: Priscilla Boys, MD  Brief narrative: [name] is a 78 year old woman with a PMH of recent cat scratch treated with Augmentin who was brought to the hospital on 10/01/12 with N/V/D.  C. Difficile PCR was positive.  Assessment/Plan: Principal Problem:  *C. difficile colitis with nausea, vomiting, diarrhea  Initially put on IV Flagyl.  PCR for C. Difficile +, and given significant leukocytosis, flagyl changed to oral vancomycin per protocol.  N/V resolved.  Advance diet. Active Problems:  Dehydration  Secondary to GI losses.  Continue IVF.  Oral thrush  Continue diflucan.  HTN (hypertension)  BP stable.  Zebeta, HCTZ and Norvasc on hold.  Hypothyroidism  Continue Synthroid.  Chronic back pain  Continue pain control measures.  Hypokalemia  KCL added to IVF.  COPD (chronic obstructive pulmonary disease)  Continue Symbicort.  Code Status: Full. Family Communication: None at bedside. Disposition Plan: Back to retirement home when stable.   Medical Consultants:  None.  Other Consultants:  None.  Procedures:  None.  Antibiotics:  Flagyl 10/01/12--->10/02/12  Oral Vancomycin 10/02/12--->  HPI/Subjective: [name] says her diarrhea is a bit better.  She has not had any N/V over night.  Feels ready to eat.  Some abdominal cramping.  Objective: Filed Vitals:   10/01/12 2104 10/02/12 0622 10/02/12 0737 10/02/12 1454  BP:  135/64  146/64  Pulse:  75  74  Temp:  97.9 F (36.6 C)  98.5 F (36.9 C)  TempSrc:  Oral    Resp:  16  16  Height:      Weight:      SpO2: 97% 98% 98% 97%    Intake/Output Summary (Last 24 hours) at 10/02/12 1711 Last data filed at 10/02/12 1300  Gross per 24 hour  Intake   1890 ml  Output    400 ml  Net   1490 ml    Exam: Gen:  NAD Cardiovascular:  RRR, No M/R/G Respiratory: Lungs CTAB Gastrointestinal:  Abdomen soft, NT/ND with normal active bowel sounds. Extremities: No C/E/C  Data Reviewed: Basic Metabolic Panel:  Lab 10/02/12 1478 10/01/12 2001  NA 139 136  K 3.1* 3.4*  CL 107 105  CO2 19 18*  GLUCOSE 74 91  BUN 12 15  CREATININE 0.47* 0.46*  CALCIUM 8.1* 8.6  MG 1.8 1.9  PHOS -- --   GFR Estimated Creatinine Clearance: 37 ml/min (by C-G formula based on Cr of 0.47). Liver Function Tests:  Lab 10/02/12 0355 10/01/12 2001  AST 8 10  ALT 6 <5  ALKPHOS 104 126*  BILITOT 0.1* 0.2*  PROT 5.9* 6.2  ALBUMIN 2.6* 2.8*    Lab 10/02/12 0355 10/01/12 2001  LIPASE 11 11  AMYLASE -- --    CBC:  Lab 10/02/12 0355 10/01/12 2001  WBC 17.0* 20.0*  NEUTROABS -- --  HGB 12.9 12.5  HCT 37.8 37.2  MCV 98.4 97.9  PLT 515* 497*   Microbiology Recent Results (from the past 240 hour(s))  CLOSTRIDIUM DIFFICILE BY PCR     Status: Abnormal   Collection Time   10/01/12  9:12 PM      Component Value Range Status Comment   C difficile by pcr POSITIVE (*) NEGATIVE Final   MRSA PCR SCREENING     Status: Normal   Collection Time   10/01/12  9:12 PM      Component Value Range Status Comment   MRSA  by PCR NEGATIVE  NEGATIVE Final      Studies: None.  Scheduled Meds:    . budesonide-formoterol  2 puff Inhalation BID  . famotidine (PEPCID) IV  20 mg Intravenous Q12H  . fentaNYL  50 mcg Transdermal Q72H  . fluconazole (DIFLUCAN) IV  100 mg Intravenous Q24H  . heparin  5,000 Units Subcutaneous Q8H  . influenza  inactive virus vaccine  0.5 mL Intramuscular Tomorrow-1000  . levothyroxine  12.5 mcg Intravenous Daily  . sodium chloride  3 mL Intravenous Q12H  . tiotropium  18 mcg Inhalation Daily  . vancomycin  125 mg Oral QID  . DISCONTD: metronidazole  500 mg Intravenous Q8H   Continuous Infusions:    . 0.9 % NaCl with KCl 40 mEq / L 100 mL/hr at 10/02/12 1539  . DISCONTD: sodium chloride 100 mL/hr at 10/01/12 2106    Time spent: 35 minutes   LOS: 1 day    Renette Hsu  [facility] Pager [phone].  If 8PM-8AM, please contact night-coverage at [email or web address], password [facility] 10/02/2012, 5:11 PM

## 2012-10-02 NOTE — Progress Notes (Signed)
CRITICAL VALUE ALERT  Critical value received:  C Diff  Date of notification:  10/02/12  Time of notification:  0930  Critical value read back:yes  Nurse who received alert:  Lyndel Safe, RN  MD notified (1st page):  Dr Darnelle Catalan  Time of first page:  [phone] Verbal notification  MD notified (2nd page):  Time of second page:  Responding MD:  Dr Darnelle Catalan  Time MD responded:  [phone]

## 2012-10-02 NOTE — Progress Notes (Signed)
INITIAL ADULT NUTRITION ASSESSMENT Date: 10/02/2012   Time: 12:04 PM Reason for Assessment: Nutrition risk   INTERVENTION: Diet advancement per MD. Recommend MD monitor pt's phosphorus as well as potassium and magnesium as pt at risk for refeeding syndrome. Will monitor.   Pt meets criteria for severe malnutrition of acute illness AEB <50% estimated energy intake for the past week with 3.2% weight loss in the past 3 weeks and pt with moderate muscle and fat loss.   ASSESSMENT: Female 78 y.o.  Dx: Persistent vomiting and diarrhea  Food/Nutrition Related Hx: Pt from assisted living facility. Pt reports eating really well there with excellent appetite. Pt states she cooks her own food and it is [place] country food which is high in fat and calories, however she still continues to not gain weight. Pt states she has tried all kinds of various nutritional supplements, however they all make her sick. Pt states her usual body weight is 92 pounds. Pt reports she has not had anything to eat/drink since Tuesday 1 week ago because of vomiting/diarrhea. Pt reports 3 pound unintended weight loss in the past 3 weeks. Pt positive for C. Difficile. Pt reports tolerating clear liquid diet breakfast this morning and is starting to feel hungry again. Pt with low potassium, however magnesium WNL.   Hx:  Past Medical History  Diagnosis Date  . lung ca dx'd 08/2010    xrt comp 10/2010  . Hypertension   . Lung cancer     LUL, LLL lung  . Osteoporosis   . Diverticulitis   . Chronic back pain    Related Meds:  Scheduled Meds:   . budesonide-formoterol  2 puff Inhalation BID  . famotidine (PEPCID) IV  20 mg Intravenous Q12H  . fentaNYL  50 mcg Transdermal Q72H  . fluconazole (DIFLUCAN) IV  100 mg Intravenous Q24H  . heparin  5,000 Units Subcutaneous Q8H  . influenza  inactive virus vaccine  0.5 mL Intramuscular Tomorrow-1000  . levothyroxine  12.5 mcg Intravenous Daily  . metronidazole  500 mg Intravenous  Q8H  . sodium chloride  3 mL Intravenous Q12H  . tiotropium  18 mcg Inhalation Daily  . vancomycin  125 mg Oral QID   Continuous Infusions:   . sodium chloride 100 mL/hr at 10/01/12 2106   PRN Meds:.sodium chloride, acetaminophen, acetaminophen, albuterol, hydrALAZINE, morphine injection, ondansetron (ZOFRAN) IV, ondansetron, oxyCODONE, sodium chloride  Ht: 5' (152.4 cm)  Wt: 89 lb (40.37 kg)  Ideal Wt: 100 lb % Ideal Wt: 89  Usual Wt: 92 lb % Usual Wt: 96   Body mass index is 17.38 kg/(m^2).   Labs:  CMP     Component Value Date/Time   NA 139 10/02/2012 0355   K 3.1* 10/02/2012 0355   CL 107 10/02/2012 0355   CO2 19 10/02/2012 0355   GLUCOSE 74 10/02/2012 0355   BUN 12 10/02/2012 0355   CREATININE 0.47* 10/02/2012 0355   CALCIUM 8.1* 10/02/2012 0355   PROT 5.9* 10/02/2012 0355   ALBUMIN 2.6* 10/02/2012 0355   AST 8 10/02/2012 0355   ALT 6 10/02/2012 0355   ALKPHOS 104 10/02/2012 0355   BILITOT 0.1* 10/02/2012 0355   GFRNONAA >90 10/02/2012 0355   GFRAA >90 10/02/2012 0355    Intake/Output Summary (Last 24 hours) at 10/02/12 1215 Last data filed at 10/02/12 0700  Gross per 24 hour  Intake   1290 ml  Output    400 ml  Net    890 ml   Last  BM - 10/15 - diarrhea   Diet Order: Clear Liquid   IVF:    sodium chloride Last Rate: 100 mL/hr at 10/01/12 2106    Estimated Nutritional Needs:   Kcal:1400-1600 Protein:60-80g Fluid:1.4-1.6L  NUTRITION DIAGNOSIS: -Inadequate oral intake (NI-2.1).  Status: Ongoing  RELATED TO: vomiting/diarrhea on admission  AS EVIDENCE BY: clear liquid diet  MONITORING/EVALUATION(Goal): Advance diet as tolerated to regular diet  EDUCATION NEEDS: -Education needs addressed - used teach back method to educate pt on high calorie/protein foods and provided handout of this information.    Dietitian #: [phone]  DOCUMENTATION CODES Per approved criteria  -Severe malnutrition in the context of acute illness or  injury -Underweight    Marshall Cork 10/02/2012, 12:04 PM

## 2012-10-03 DIAGNOSIS — A0472 Enterocolitis due to Clostridium difficile, not specified as recurrent: Principal | ICD-10-CM

## 2012-10-03 DIAGNOSIS — J45909 Unspecified asthma, uncomplicated: Secondary | ICD-10-CM

## 2012-10-03 DIAGNOSIS — J449 Chronic obstructive pulmonary disease, unspecified: Secondary | ICD-10-CM

## 2012-10-03 LAB — BASIC METABOLIC PANEL
BUN: 5 mg/dL — ABNORMAL LOW (ref 6–23)
Creatinine, Ser: 0.38 mg/dL — ABNORMAL LOW (ref 0.50–1.10)
GFR calc non Af Amer: >90 mL/min (ref >90)
Glucose, Bld: 85 mg/dL (ref 70–99)
Potassium: 3.6 mEq/L (ref 3.5–5.1)

## 2012-10-03 LAB — CBC
Hemoglobin: 10.9 g/dL — ABNORMAL LOW (ref 12.0–15.0)
MCH: 33 pg (ref 26.0–34.0)
MCHC: 33.7 g/dL (ref 30.0–36.0)
RDW: 13.2 % (ref 11.5–15.5)

## 2012-10-03 MED ORDER — FLUCONAZOLE 100 MG PO TABS: 100.0000 mg | ORAL_TABLET | Freq: Every day | ORAL | Status: DC

## 2012-10-03 MED ORDER — HYDROCODONE-ACETAMINOPHEN 5-325 MG PO TABS: 1.0000 | ORAL_TABLET | Freq: Three times a day (TID) | ORAL | Status: DC | PRN

## 2012-10-03 MED ORDER — ONDANSETRON HCL 4 MG PO TABS: 4.0000 mg | ORAL_TABLET | Freq: Four times a day (QID) | ORAL | Status: DC | PRN

## 2012-10-03 MED ORDER — ACETAMINOPHEN 325 MG PO TABS: 650.0000 mg | ORAL_TABLET | Freq: Four times a day (QID) | ORAL | Status: DC | PRN

## 2012-10-03 MED ORDER — OXYCODONE HCL 5 MG PO TABS: 5.0000 mg | ORAL_TABLET | ORAL | Status: DC | PRN

## 2012-10-03 MED ORDER — TIOTROPIUM BROMIDE MONOHYDRATE 18 MCG IN CAPS: 18.0000 ug | ORAL_CAPSULE | Freq: Every day | RESPIRATORY_TRACT | Status: DC

## 2012-10-03 MED ORDER — VANCOMYCIN 50 MG/ML ORAL SOLUTION: 125.0000 mg | Freq: Four times a day (QID) | ORAL | Status: DC

## 2012-10-03 NOTE — Discharge Summary (Signed)
Physician Discharge Summary  Anglia Blakley Trumbo JXB:[id number] DOB: 1932-07-18 DOA: 10/01/2012  PCP: [name], MD  Admit date: 10/01/2012 Discharge date: 10/03/2012  Discharge Diagnoses:  Principal Problem:  *C. difficile colitis Active Problems:  Dehydration  Oral thrush  HTN (hypertension)  Hypothyroidism  Chronic back pain  Hypokalemia  COPD (chronic obstructive pulmonary disease)  Discharge Condition: medically stable for discharge to an independent living facility today  Diet recommendation: as tolerated  History of present illness:  [name] is a 78 year old woman with a PMH of recent cat scratch treated with Augmentin who was brought to the hospital on 10/01/12 with N/V/D. C. Difficile PCR was positive.   Assessment/Plan:   Principal Problem:  *C. difficile colitis with nausea, vomiting, diarrhea  Continue oral vancomycin QID until and including 10/15/2012 per pharmacy recomendations N/V resolved  Active Problems:  Dehydration  Secondary to GI losses.  Tolerates PO intake well  Oral thrush  Continue diflucan now for additional 4 days to complete total of 7 day regimen HTN (hypertension)  Continue current home meds Hypothyroidism  Continue Synthroid. Chronic back pain  Continue current pain regimen with oxycodone 5 mg PO Q 4 hours PRN but once discharged may continue norco and oxycodone  Hypokalemia  KCL added to IVF. COPD (chronic obstructive pulmonary disease)  Continue Symbicort.  Code Status: Full.  Family Communication: None at bedside.  Disposition Plan: Back to retirement home today  Medical Consultants:  None. Other Consultants:  None. Procedures:  None. Antibiotics:  Flagyl 10/01/12--->10/02/12  Oral Vancomycin 10/02/12---> 10/15/2012  Manson Passey, MD  [facility]  Pager [phone]   Discharge Exam: Filed Vitals:   10/03/12 0500  BP: 149/69  Pulse: 73  Temp: 97.8 F (36.6 C)  Resp:    Filed Vitals:   10/02/12 1935  10/02/12 2244 10/03/12 0500 10/03/12 0733  BP:  122/53 149/69   Pulse:  68 73   Temp:  98.3 F (36.8 C) 97.8 F (36.6 C)   TempSrc:   Oral   Resp:  16    Height:      Weight:      SpO2: 97% 96% 98% 98%    General: Pt is alert, follows commands appropriately, not in acute distress Cardiovascular: Regular rate and rhythm, S1/S2 +, no murmurs, no rubs, no gallops Respiratory: Clear to auscultation bilaterally, no wheezing, no crackles, no rhonchi Abdominal: Soft, non tender, non distended, bowel sounds +, no guarding Extremities: no edema, no cyanosis, pulses palpable bilaterally DP and PT Neuro: Grossly nonfocal  Discharge Instructions  Discharge Orders    Future Orders Please Complete By Expires   Diet - low sodium heart healthy      Increase activity slowly      Discharge instructions      Comments:   Please note that the last dose for vancomycin is to end 10/15/2012  Continue fluconazole 100 mg daily for 4 more days   Call MD for:  persistant nausea and vomiting      Call MD for:  severe uncontrolled pain      Call MD for:  difficulty breathing, headache or visual disturbances      Call MD for:  persistant dizziness or light-headedness          Medication List     As of 10/03/2012 10:32 AM    TAKE these medications         acetaminophen 325 MG tablet   Commonly known as: TYLENOL   Take 2  tablets (650 mg total) by mouth every 6 (six) hours as needed for pain or fever (or Fever >/= 101).      amLODipine 10 MG tablet   Commonly known as: NORVASC   Take 10 mg by mouth daily.      bisoprolol 5 MG tablet   Commonly known as: ZEBETA   Take 5 mg by mouth daily.      budesonide-formoterol 160-4.5 MCG/ACT inhaler   Commonly known as: SYMBICORT   Inhale 2 puffs into the lungs 2 (two) times daily.      buPROPion 300 MG 24 hr tablet   Commonly known as: WELLBUTRIN XL   Take 300 mg by mouth daily.      fentaNYL 50 MCG/HR   Commonly known as: DURAGESIC - dosed  mcg/hr   Place 1 patch onto the skin every 3 (three) days.      fexofenadine 180 MG tablet   Commonly known as: ALLEGRA   Take 180 mg by mouth daily.      fluconazole 100 MG tablet   Commonly known as: DIFLUCAN   Take 1 tablet (100 mg total) by mouth daily.      hydrochlorothiazide 12.5 MG capsule   Commonly known as: MICROZIDE   Take 12.5 mg by mouth daily.      HYDROcodone-acetaminophen 5-325 MG per tablet   Commonly known as: NORCO/VICODIN   Take 1 tablet by mouth every 8 (eight) hours as needed for pain. pain      levothyroxine 25 MCG tablet   Commonly known as: SYNTHROID, LEVOTHROID   Take 25 mcg by mouth daily.      ondansetron 4 MG tablet   Commonly known as: ZOFRAN   Take 1 tablet (4 mg total) by mouth every 6 (six) hours as needed for nausea.      oxyCODONE 5 MG immediate release tablet   Commonly known as: Oxy IR/ROXICODONE   Take 1 tablet (5 mg total) by mouth every 4 (four) hours as needed for pain.      psyllium 58.6 % packet   Commonly known as: METAMUCIL   Take 1 packet by mouth daily.      tiotropium 18 MCG inhalation capsule   Commonly known as: SPIRIVA   Place 1 capsule (18 mcg total) into inhaler and inhale daily.      traZODone 50 MG tablet   Commonly known as: DESYREL   Take 50 mg by mouth at bedtime.      vancomycin 50 mg/mL oral solution   Commonly known as: VANCOCIN   Take 2.5 mLs (125 mg total) by mouth 4 (four) times daily.           Follow-up Information    Follow up with FRIED, ROBERT L, MD. In 2 weeks. (If symptoms worsen)    Contact information:   [address] [place] [address] [phone]           The results of significant diagnostics from this hospitalization (including imaging, microbiology, ancillary and laboratory) are listed below for reference.    Significant Diagnostic Studies: Ct Chest Wo Contrast 09/12/2012  * IMPRESSION:  1.  Postoperative changes in the left upper lobe with a small nodular component, as on prior  exams.  Continued attention on follow-up exams is warranted. 2.  Postoperative changes in the left lower lobe, stable. 3.  Diffuse peribronchovascular nodularity and/or subpleural reticulation, as before, indicating mild fibrosis. 4.  Bilateral adrenal adenomas. 5.  Bilateral renal stones.  Microbiology: CLOSTRIDIUM DIFFICILE BY PCR     Status: Abnormal   Collection Time   10/01/12  9:12 PM      Component Value Range Status Comment   C difficile by pcr POSITIVE (*) NEGATIVE Final      Labs: Basic Metabolic Panel:  Lab 10/03/12 1610 10/02/12 0355 10/01/12 2001  NA 140 139 136  K 3.6 3.1* 3.4*  CL 112 107 105  CO2 21 19 18*  GLUCOSE 85 74 91  BUN 5* 12 15  CREATININE 0.38* 0.47* 0.46*  CALCIUM 7.7* 8.1* 8.6   Liver Function Tests:  Lab 10/02/12 0355 10/01/12 2001  AST 8 10  ALT 6 <5  ALKPHOS 104 126*  BILITOT 0.1* 0.2*  PROT 5.9* 6.2  ALBUMIN 2.6* 2.8*    Lab 10/02/12 0355 10/01/12 2001  LIPASE 11 11   CBC:  Lab 10/03/12 0401 10/02/12 0355 10/01/12 2001  WBC 10.9* 17.0* 20.0*  HGB 10.9* 12.9 12.5  HCT 32.3* 37.8 37.2  MCV 97.9 98.4 97.9  PLT 460* 515* 497*   Time coordinating discharge: Over 30 minutes  Signed:  Manson Passey, MD  [facility] 10/03/2012, 10:32 AM  Pager #: [phone]

## 2012-10-07 ENCOUNTER — Encounter (HOSPITAL_COMMUNITY): Payer: Self-pay | Admitting: Emergency Medicine

## 2012-10-07 ENCOUNTER — Emergency Department (HOSPITAL_COMMUNITY): Payer: Medicare Other

## 2012-10-07 ENCOUNTER — Inpatient Hospital Stay (HOSPITAL_COMMUNITY)
Admission: EM | Admit: 2012-10-07 | Discharge: 2012-10-12 | DRG: 469 | Disposition: A | Discharge status: Skilled Nursing Facility | Payer: Medicare Other | Attending: Internal Medicine | Admitting: Internal Medicine

## 2012-10-07 DIAGNOSIS — R339 Retention of urine, unspecified: Secondary | ICD-10-CM | POA: Diagnosis not present

## 2012-10-07 DIAGNOSIS — G8929 Other chronic pain: Secondary | ICD-10-CM

## 2012-10-07 DIAGNOSIS — Z96659 Presence of unspecified artificial knee joint: Secondary | ICD-10-CM

## 2012-10-07 DIAGNOSIS — M810 Age-related osteoporosis without current pathological fracture: Secondary | ICD-10-CM | POA: Diagnosis present

## 2012-10-07 DIAGNOSIS — J449 Chronic obstructive pulmonary disease, unspecified: Secondary | ICD-10-CM | POA: Diagnosis present

## 2012-10-07 DIAGNOSIS — Z87891 Personal history of nicotine dependence: Secondary | ICD-10-CM

## 2012-10-07 DIAGNOSIS — J4489 Other specified chronic obstructive pulmonary disease: Secondary | ICD-10-CM | POA: Diagnosis present

## 2012-10-07 DIAGNOSIS — A0472 Enterocolitis due to Clostridium difficile, not specified as recurrent: Secondary | ICD-10-CM | POA: Diagnosis present

## 2012-10-07 DIAGNOSIS — Y92009 Unspecified place in unspecified non-institutional (private) residence as the place of occurrence of the external cause: Secondary | ICD-10-CM

## 2012-10-07 DIAGNOSIS — I10 Essential (primary) hypertension: Secondary | ICD-10-CM | POA: Diagnosis present

## 2012-10-07 DIAGNOSIS — C3490 Malignant neoplasm of unspecified part of unspecified bronchus or lung: Secondary | ICD-10-CM | POA: Diagnosis present

## 2012-10-07 DIAGNOSIS — E43 Unspecified severe protein-calorie malnutrition: Secondary | ICD-10-CM | POA: Diagnosis present

## 2012-10-07 DIAGNOSIS — W19XXXA Unspecified fall, initial encounter: Secondary | ICD-10-CM | POA: Diagnosis present

## 2012-10-07 DIAGNOSIS — B370 Candidal stomatitis: Secondary | ICD-10-CM | POA: Diagnosis present

## 2012-10-07 DIAGNOSIS — Z923 Personal history of irradiation: Secondary | ICD-10-CM

## 2012-10-07 DIAGNOSIS — F329 Major depressive disorder, single episode, unspecified: Secondary | ICD-10-CM | POA: Diagnosis present

## 2012-10-07 DIAGNOSIS — S72002A Fracture of unspecified part of neck of left femur, initial encounter for closed fracture: Secondary | ICD-10-CM

## 2012-10-07 DIAGNOSIS — Z681 Body mass index (BMI) 19 or less, adult: Secondary | ICD-10-CM

## 2012-10-07 DIAGNOSIS — Z79899 Other long term (current) drug therapy: Secondary | ICD-10-CM

## 2012-10-07 DIAGNOSIS — S72009A Fracture of unspecified part of neck of unspecified femur, initial encounter for closed fracture: Principal | ICD-10-CM | POA: Diagnosis present

## 2012-10-07 DIAGNOSIS — E039 Hypothyroidism, unspecified: Secondary | ICD-10-CM | POA: Diagnosis present

## 2012-10-07 DIAGNOSIS — Z8719 Personal history of other diseases of the digestive system: Secondary | ICD-10-CM

## 2012-10-07 DIAGNOSIS — M549 Dorsalgia, unspecified: Secondary | ICD-10-CM | POA: Diagnosis present

## 2012-10-07 DIAGNOSIS — J189 Pneumonia, unspecified organism: Secondary | ICD-10-CM

## 2012-10-07 DIAGNOSIS — F3289 Other specified depressive episodes: Secondary | ICD-10-CM | POA: Diagnosis present

## 2012-10-07 DIAGNOSIS — Z96619 Presence of unspecified artificial shoulder joint: Secondary | ICD-10-CM

## 2012-10-07 LAB — CBC WITH DIFFERENTIAL/PLATELET
Basophils Relative: 0 % (ref 0–1)
Eosinophils Absolute: 0 10*3/uL (ref 0.0–0.7)
MCH: 32.9 pg (ref 26.0–34.0)
MCHC: 32.9 g/dL (ref 30.0–36.0)
Neutrophils Relative %: 91 % — ABNORMAL HIGH (ref 43–77)
Platelets: 509 10*3/uL — ABNORMAL HIGH (ref 150–400)
RBC: 3.1 MIL/uL — ABNORMAL LOW (ref 3.87–5.11)

## 2012-10-07 LAB — BASIC METABOLIC PANEL
BUN: 7 mg/dL (ref 6–23)
Calcium: 8.1 mg/dL — ABNORMAL LOW (ref 8.4–10.5)
GFR calc non Af Amer: >90 mL/min (ref >90)
Glucose, Bld: 133 mg/dL — ABNORMAL HIGH (ref 70–99)
Sodium: 135 mEq/L (ref 135–145)

## 2012-10-07 LAB — URINALYSIS, MICROSCOPIC ONLY
Bilirubin Urine: NEGATIVE
Glucose, UA: NEGATIVE mg/dL
Ketones, ur: NEGATIVE mg/dL
pH: 6 (ref 5.0–8.0)

## 2012-10-07 LAB — CBC
MCH: 33.3 pg (ref 26.0–34.0)
MCHC: 33.2 g/dL (ref 30.0–36.0)
MCV: 100.3 fL — ABNORMAL HIGH (ref 78.0–100.0)
Platelets: 466 10*3/uL — ABNORMAL HIGH (ref 150–400)
RDW: 12.8 % (ref 11.5–15.5)

## 2012-10-07 LAB — PROTIME-INR
INR: 1.08 (ref 0.00–1.49)
Prothrombin Time: 13.9 seconds (ref 11.6–15.2)

## 2012-10-07 LAB — SURGICAL PCR SCREEN
MRSA, PCR: NEGATIVE
Staphylococcus aureus: NEGATIVE

## 2012-10-07 LAB — TSH: TSH: 1.549 u[IU]/mL (ref 0.350–4.500)

## 2012-10-07 LAB — MAGNESIUM: Magnesium: 1.7 mg/dL (ref 1.5–2.5)

## 2012-10-07 LAB — HEPATIC FUNCTION PANEL
Bilirubin, Direct: <0.1 mg/dL (ref 0.0–0.3)
Total Protein: 5.7 g/dL — ABNORMAL LOW (ref 6.0–8.3)

## 2012-10-07 MED ORDER — MORPHINE SULFATE 4 MG/ML IJ SOLN
6.0000 mg | Freq: Once | INTRAMUSCULAR | Status: AC
  Administered 2012-10-07: 6 mg via INTRAVENOUS
  Filled 2012-10-07: qty 2

## 2012-10-07 MED ORDER — ONDANSETRON HCL 4 MG PO TABS: 4.0000 mg | ORAL_TABLET | Freq: Four times a day (QID) | ORAL | Status: DC | PRN

## 2012-10-07 MED ORDER — MORPHINE SULFATE 4 MG/ML IJ SOLN
4.0000 mg | INTRAMUSCULAR | Status: DC | PRN
  Administered 2012-10-07 – 2012-10-10 (×9): 4 mg via INTRAVENOUS
  Filled 2012-10-07 (×10): qty 1

## 2012-10-07 MED ORDER — MOXIFLOXACIN HCL 400 MG PO TABS
400.0000 mg | ORAL_TABLET | Freq: Once | ORAL | Status: AC
  Administered 2012-10-07: 400 mg via ORAL
  Filled 2012-10-07: qty 1

## 2012-10-07 MED ORDER — ENOXAPARIN SODIUM 40 MG/0.4ML ~~LOC~~ SOLN
40.0000 mg | SUBCUTANEOUS | Status: DC
  Administered 2012-10-07 – 2012-10-08 (×2): 40 mg via SUBCUTANEOUS
  Filled 2012-10-07 (×3): qty 0.4

## 2012-10-07 MED ORDER — SODIUM CHLORIDE 0.9 % IJ SOLN
3.0000 mL | Freq: Two times a day (BID) | INTRAMUSCULAR | Status: DC
  Administered 2012-10-07 – 2012-10-08 (×3): 3 mL via INTRAVENOUS

## 2012-10-07 MED ORDER — MOXIFLOXACIN HCL IN NACL 400 MG/250ML IV SOLN
400.0000 mg | INTRAVENOUS | Status: DC
  Administered 2012-10-08 – 2012-10-11 (×4): 400 mg via INTRAVENOUS
  Filled 2012-10-07 (×4): qty 250

## 2012-10-07 MED ORDER — ACETAMINOPHEN 650 MG RE SUPP: 650.0000 mg | Freq: Four times a day (QID) | RECTAL | Status: DC | PRN

## 2012-10-07 MED ORDER — SODIUM CHLORIDE 0.9 % IV SOLN: INTRAVENOUS | Status: DC

## 2012-10-07 MED ORDER — BISOPROLOL FUMARATE 5 MG PO TABS
5.0000 mg | ORAL_TABLET | Freq: Every day | ORAL | Status: DC
  Administered 2012-10-07 – 2012-10-12 (×6): 5 mg via ORAL
  Filled 2012-10-07 (×6): qty 1

## 2012-10-07 MED ORDER — FENTANYL 50 MCG/HR TD PT72
50.0000 ug | MEDICATED_PATCH | TRANSDERMAL | Status: DC
  Administered 2012-10-07 – 2012-10-10 (×2): 50 ug via TRANSDERMAL
  Filled 2012-10-07 (×3): qty 1

## 2012-10-07 MED ORDER — TRAZODONE HCL 50 MG PO TABS
50.0000 mg | ORAL_TABLET | Freq: Every day | ORAL | Status: DC
  Administered 2012-10-07 – 2012-10-11 (×5): 50 mg via ORAL
  Filled 2012-10-07 (×6): qty 1

## 2012-10-07 MED ORDER — FLUCONAZOLE 100 MG PO TABS
100.0000 mg | ORAL_TABLET | Freq: Every day | ORAL | Status: DC
  Administered 2012-10-07 – 2012-10-12 (×6): 100 mg via ORAL
  Filled 2012-10-07 (×7): qty 1

## 2012-10-07 MED ORDER — SODIUM CHLORIDE 0.9 % IV SOLN
Freq: Once | INTRAVENOUS | Status: AC
  Administered 2012-10-07: 09:00:00 via INTRAVENOUS

## 2012-10-07 MED ORDER — ONDANSETRON HCL 4 MG/2ML IJ SOLN: 4.0000 mg | Freq: Four times a day (QID) | INTRAMUSCULAR | Status: DC | PRN

## 2012-10-07 MED ORDER — ACETAMINOPHEN 325 MG PO TABS: 650.0000 mg | ORAL_TABLET | Freq: Four times a day (QID) | ORAL | Status: DC | PRN

## 2012-10-07 MED ORDER — BUDESONIDE-FORMOTEROL FUMARATE 160-4.5 MCG/ACT IN AERO
2.0000 | INHALATION_SPRAY | Freq: Two times a day (BID) | RESPIRATORY_TRACT | Status: DC
  Administered 2012-10-07 – 2012-10-12 (×9): 2 via RESPIRATORY_TRACT
  Filled 2012-10-07: qty 6

## 2012-10-07 MED ORDER — VANCOMYCIN 50 MG/ML ORAL SOLUTION
125.0000 mg | Freq: Four times a day (QID) | ORAL | Status: DC
  Administered 2012-10-07 – 2012-10-10 (×13): 125 mg via ORAL
  Filled 2012-10-07 (×17): qty 2.5

## 2012-10-07 MED ORDER — BUPROPION HCL ER (XL) 300 MG PO TB24
300.0000 mg | ORAL_TABLET | Freq: Every day | ORAL | Status: DC
  Administered 2012-10-07 – 2012-10-12 (×6): 300 mg via ORAL
  Filled 2012-10-07 (×7): qty 1

## 2012-10-07 MED ORDER — LEVOTHYROXINE SODIUM 25 MCG PO TABS
25.0000 ug | ORAL_TABLET | Freq: Every day | ORAL | Status: DC
  Administered 2012-10-07 – 2012-10-12 (×6): 25 ug via ORAL
  Filled 2012-10-07 (×8): qty 1

## 2012-10-07 MED ORDER — LEVALBUTEROL HCL 0.63 MG/3ML IN NEBU
0.6300 mg | INHALATION_SOLUTION | Freq: Four times a day (QID) | RESPIRATORY_TRACT | Status: DC
  Administered 2012-10-07 – 2012-10-11 (×16): 0.63 mg via RESPIRATORY_TRACT
  Filled 2012-10-07 (×20): qty 3

## 2012-10-07 MED ORDER — TIOTROPIUM BROMIDE MONOHYDRATE 18 MCG IN CAPS
18.0000 ug | ORAL_CAPSULE | Freq: Every day | RESPIRATORY_TRACT | Status: DC
  Administered 2012-10-08 – 2012-10-12 (×5): 18 ug via RESPIRATORY_TRACT
  Filled 2012-10-07 (×2): qty 5

## 2012-10-07 MED ORDER — SODIUM CHLORIDE 0.9 % IV SOLN
INTRAVENOUS | Status: DC
  Administered 2012-10-07 – 2012-10-11 (×5): via INTRAVENOUS

## 2012-10-07 NOTE — ED Notes (Signed)
Attempted to call report to floor. RN unable to take report at this time but will call back when able.

## 2012-10-07 NOTE — H&P (Addendum)
[facility] History and Physical  PARTHENA FERGESON ZOX:[id number] DOB: 1932-08-16 DOA: 10/07/2012  Referring physician: PCP: [name], MD   Chief Complaint: Fall  HPI:  This is a 78 year old female, with known history of HTN, depression, COPD, hypothyroidism, OA, s/p left knee replacement, s/p left artificial shoulder, osteoporosis/vertebral fractures, s/p vertebroplasty x 2, chronic back pain on long-term opioid medication, diverticulosis, non-small cell carcinoma of LUL/LLL s/p XRT,, s/p right cataract surgery 09/24/12, presenting with above symptoms. According to patient, and office notes obtained from primary provider, [name], PA, patient is a resident of [address] [facility], where patient reports she got up this morning to use to restroom when she fell onto her left side injuring her left hip. She reports moderate to severe pain in her left hip. EMS notes a deformity in her left hip. She denies head injury. She denies loss consciousness. She has no neck pain. She has no weakness of her upper lower extremities. She has no chest pain or shortness of breath. The patient has a history of lung cancer and also continues to smoke cigarettes. She has no prior cardiac history. She is on pain meds for chronic back pain. She denies syncope.   She was recently hospitalized and treated for C. difficile colitis and discharged on 10/14. Patient had suffered a cat scratch wound on her right ankle about 2 weeks ago, which appeared to have become infected, so had been started on oral Augumentin by her PMD about a week ago.        Review of Systems: negative for the following  Constitutional: Denies fever, chills, diaphoresis, appetite change and fatigue.  HEENT: Denies photophobia, eye pain, redness, hearing loss, ear pain, congestion, sore throat, rhinorrhea, sneezing, mouth sores, trouble swallowing, neck pain, neck stiffness and tinnitus.  Respiratory: Denies SOB, DOE, cough,  chest tightness, and wheezing.  Cardiovascular: Denies chest pain, palpitations and leg swelling.  Gastrointestinal: Denies nausea, vomiting, abdominal pain, diarrhea, constipation, blood in stool and abdominal distention.  Genitourinary: Denies dysuria, urgency, frequency, hematuria, flank pain and difficulty urinating.  Musculoskeletal: Denies myalgias, back pain, joint swelling, arthralgias and gait problem.  Skin: Denies pallor, rash and wound.  Neurological: Denies dizziness, seizures, syncope, weakness, light-headedness, numbness and headaches.  Hematological: Denies adenopathy. Easy bruising, personal or family bleeding history  Psychiatric/Behavioral: Denies suicidal ideation, mood changes, confusion, nervousness, sleep disturbance and agitation       Past Medical History  Diagnosis Date  . lung ca dx'd 08/2010    xrt comp 10/2010  . Hypertension   . Lung cancer     LUL, LLL lung  . Osteoporosis   . Diverticulitis   . Chronic back pain      No past surgical history on file.    Social History:  Social History:  Patient is married, has one son, and is a resident of [facility]. Ex-smoker, not currently a smoking. She does not have any smokeless tobacco history on file. History of alcohol or drug use   Family History   Problem  Relation  Age of Onset   .  Cancer  Mother       lung    .  Cancer  Father       stomach       Allergies  Allergen Reactions  . Aspirin     REACTION: gi upset    Family History  Problem Relation Age of Onset  . Cancer Mother     lung  .  Cancer Father     stomach     Prior to Admission medications   Medication Sig Start Date End Date Taking? Authorizing Provider  acetaminophen (TYLENOL) 325 MG tablet Take 2 tablets (650 mg total) by mouth every 6 (six) hours as needed for pain or fever (or Fever >/= 101). 10/03/12   Alison Murray, MD  amLODipine (NORVASC) 10 MG tablet Take 10 mg by mouth daily.    Historical  Provider, MD  bisoprolol (ZEBETA) 5 MG tablet Take 5 mg by mouth daily.    Historical Provider, MD  budesonide-formoterol (SYMBICORT) 160-4.5 MCG/ACT inhaler Inhale 2 puffs into the lungs 2 (two) times daily.    Historical Provider, MD  buPROPion (WELLBUTRIN XL) 300 MG 24 hr tablet Take 300 mg by mouth daily.    Historical Provider, MD  fentaNYL (DURAGESIC - DOSED MCG/HR) 50 MCG/HR Place 1 patch onto the skin every 3 (three) days.    Historical Provider, MD  fexofenadine (ALLEGRA) 180 MG tablet Take 180 mg by mouth daily.    Historical Provider, MD  fluconazole (DIFLUCAN) 100 MG tablet Take 1 tablet (100 mg total) by mouth daily. 10/03/12   Alison Murray, MD  hydrochlorothiazide (MICROZIDE) 12.5 MG capsule Take 12.5 mg by mouth daily.    Historical Provider, MD  HYDROcodone-acetaminophen (NORCO/VICODIN) 5-325 MG per tablet Take 1 tablet by mouth every 8 (eight) hours as needed for pain. pain 10/03/12   Alison Murray, MD  levothyroxine (SYNTHROID, LEVOTHROID) 25 MCG tablet Take 25 mcg by mouth daily.    Historical Provider, MD  ondansetron (ZOFRAN) 4 MG tablet Take 1 tablet (4 mg total) by mouth every 6 (six) hours as needed for nausea. 10/03/12   Alison Murray, MD  oxyCODONE (OXY IR/ROXICODONE) 5 MG immediate release tablet Take 1 tablet (5 mg total) by mouth every 4 (four) hours as needed for pain. 10/03/12   Alison Murray, MD  psyllium (METAMUCIL) 58.6 % packet Take 1 packet by mouth daily.    Historical Provider, MD  tiotropium (SPIRIVA) 18 MCG inhalation capsule Place 1 capsule (18 mcg total) into inhaler and inhale daily. 10/03/12   Alison Murray, MD  traZODone (DESYREL) 50 MG tablet Take 50 mg by mouth at bedtime.    Historical Provider, MD  vancomycin (VANCOCIN) 50 mg/mL oral solution Take 2.5 mLs (125 mg total) by mouth 4 (four) times daily. 10/03/12   Alison Murray, MD     Physical Exam: Filed Vitals:   10/07/12 0622 10/07/12 0629 10/07/12 0714  BP:  147/66 111/52  Pulse:  96 92    Temp:  98.3 F (36.8 C)   TempSrc:  Oral   Resp:  18 18  Weight:  39.917 kg (88 lb)   SpO2: 100% 99% 92%     Nursing note and vitals reviewed.  Constitutional: She is oriented to person, place, and time. She appears well-developed and well-nourished. No distress.  HENT:  Head: Normocephalic and atraumatic.  Eyes: EOM are normal. Pupils are equal, round, and reactive to light.  Neck: Normal range of motion. Neck supple.  C-spine nontender. No cervical spine step-offs. C-spine cleared by Nexus criteria.  Cardiovascular: Normal rate, regular rhythm and normal heart sounds.  Pulmonary/Chest: Effort normal and breath sounds normal.  Abdominal: Soft. She exhibits no distension. There is no tenderness.  Musculoskeletal: Normal range of motion.  Left leg is shortened and internally rotated. Normal pulses her left foot. Pain with range of motion of her left  hip.  Neurological: She is alert and oriented to person, place, and time.  Skin: Skin is warm and dry.  Psychiatric: She has a normal mood and affect. Judgment normal.   Psychiatric: Normal mood and affect. speech and behavior is normal. Judgment and thought content normal. Cognition and memory are normal.       Labs on Admission:    Basic Metabolic Panel:  Lab 10/07/12 8119 10/03/12 0401 10/02/12 0355 10/01/12 2001  NA 135 140 139 136  K 4.0 3.6 3.1* 3.4*  CL 98 112 107 105  CO2 28 21 19  18*  GLUCOSE 133* 85 74 91  BUN 7 5* 12 15  CREATININE 0.38* 0.38* 0.47* 0.46*  CALCIUM 8.1* 7.7* 8.1* 8.6  MG -- -- 1.8 1.9  PHOS -- -- -- --   Liver Function Tests:  Lab 10/02/12 0355 10/01/12 2001  AST 8 10  ALT 6 <5  ALKPHOS 104 126*  BILITOT 0.1* 0.2*  PROT 5.9* 6.2  ALBUMIN 2.6* 2.8*    Lab 10/02/12 0355 10/01/12 2001  LIPASE 11 11  AMYLASE -- --   No results found for this basename: AMMONIA:5 in the last 168 hours CBC:  Lab 10/07/12 0737 10/03/12 0401 10/02/12 0355 10/01/12 2001  WBC 22.5* 10.9* 17.0* 20.0*   NEUTROABS 20.4* -- -- --  HGB 10.2* 10.9* 12.9 12.5  HCT 31.0* 32.3* 37.8 37.2  MCV 100.0 97.9 98.4 97.9  PLT 509* 460* 515* 497*   Cardiac Enzymes: No results found for this basename: CKTOTAL:5,CKMB:5,CKMBINDEX:5,TROPONINI:5 in the last 168 hours  BNP (last 3 results) No results found for this basename: PROBNP:3 in the last 8760 hours    CBG: No results found for this basename: GLUCAP:5 in the last 168 hours  Radiological Exams on Admission: Dg Chest 1 View  10/07/2012  *RADIOLOGY REPORT*  Clinical Data: Hip pain  CHEST - 1 VIEW  Comparison: 09/12/2012  Findings: Heart size is normal.  There is no pleural effusion or edema.  Left base opacities appear new from previous exam and is suspicious for pneumonia.  Stable scar like densities identified within the left lower lobe.  IMPRESSION:  1.  Left base opacity appears new from previous exam.  Suspicious for pneumonia.  Recommend follow-up imaging with dedicated PA and lateral chest radiograph.   Original Report Authenticated By: Rosealee Albee, M.D.    Dg Hip Complete Left  10/07/2012  *RADIOLOGY REPORT*  Clinical Data: Hip pain  LEFT HIP - COMPLETE 2+ VIEW  Comparison: None  Findings: There is a slightly impacted left femoral neck fracture. Mild varus angulation of the distal fracture fragments.  No subluxation.  IMPRESSION:  1.  Impacted left femoral neck fracture.   Original Report Authenticated By: Rosealee Albee, M.D.     EKG: Date: 10/07/2012  Rate: 94  Rhythm: normal sinus rhythm  QRS Axis: normal  Intervals: normal  ST/T Wave abnormalities: normal  Conduction Disutrbances: none  Narrative Interpretation:  Old EKG Reviewed: No significant changes noted   Assessment/Plan Principal Problem:  *Hip fracture Active Problems:  C. difficile colitis  Oral thrush  HTN (hypertension)  COPD (chronic obstructive pulmonary disease)  Healthcare-associated pneumonia   Impacted left femoral neck fracture, orthopedic  service from [facility] orthopedics has been consulted by the EDP, at this time shows multiple ongoing medical issues, follow was likely mechanical. Continue perioperative beta blocker, discussed with patient that she is up moderate to high risk for surgery, patient agreeable to proceed with the surgery, I  personally spoke to Dr Charlann Boxer from [facility] , Discussed about operating early next week given PNA on CXR   Recent C. difficile colitis continue with vancomycin taper still has been significantly elevated white count  Possible pneumonia, although the patient was recently hospitalized and should be treated for healthcare associated pneumonia, given her recent episode of C. difficile colitis, will hold off on using broad-spectrum antibiotics, will treat with vancomycin IV and Avelox IV  COPD currently stable, without any clinical symptoms. The patient will continue on her nebulizer treatments and Symbicort  Chronic back pain, has a high tolerance for narcotics, on fentanyl patch  History of non-small cell lung cancer this appears to be stable   Code Status:   full Family Communication: bedside Disposition Plan: admit   Time spent: 70 mins   [facility] Pager [phone]  If 7PM-7AM, please contact night-coverage [email or web address] Password [email or web address] 10/07/2012, 8:35 AM

## 2012-10-07 NOTE — ED Provider Notes (Signed)
History     CSN: [id number]  Arrival date & time 10/07/12  8119   First MD Initiated Contact with Patient 10/07/12 [phone]      Chief Complaint  Patient presents with  . Hip Pain    (Consider location/radiation/quality/duration/timing/severity/associated sxs/prior treatment) The history is provided by the patient and a relative.   the patient reports she got up this morning to use to restroom when she fell onto her left side injuring her left hip.  She reports moderate to severe pain in her left hip.  EMS notes a deformity in her left hip.  She denies head injury.  She denies loss consciousness.  She has no neck pain.  She has no weakness of her upper lower extremities.  She has no chest pain or shortness of breath.  The patient has a history of lung cancer and also continues to smoke cigarettes.  She has no prior cardiac history.  She is on pain meds for chronic back pain.  She denies syncope.  Past Medical History  Diagnosis Date  . lung ca dx'd 08/2010    xrt comp 10/2010  . Hypertension   . Lung cancer     LUL, LLL lung  . Osteoporosis   . Diverticulitis   . Chronic back pain     No past surgical history on file.  Family History  Problem Relation Age of Onset  . Cancer Mother     lung  . Cancer Father     stomach    History  Substance Use Topics  . Smoking status: Smoker, Current Status Unknown -- 1.0 packs/day for 62 years  . Smokeless tobacco: Not on file  . Alcohol Use:     OB History    Grav Para Term Preterm Abortions TAB SAB Ect Mult Living                  Review of Systems  All other systems reviewed and are negative.    Allergies  Aspirin  Home Medications   Current Outpatient Rx  Name Route Sig Dispense Refill  . ACETAMINOPHEN 325 MG PO TABS Oral Take 2 tablets (650 mg total) by mouth every 6 (six) hours as needed for pain or fever (or Fever >/= 101). 60 tablet 0  . AMLODIPINE BESYLATE 10 MG PO TABS Oral Take 10 mg by mouth daily.    Marland Kitchen  BISOPROLOL FUMARATE 5 MG PO TABS Oral Take 5 mg by mouth daily.    . BUDESONIDE-FORMOTEROL FUMARATE 160-4.5 MCG/ACT IN AERO Inhalation Inhale 2 puffs into the lungs 2 (two) times daily.    . BUPROPION HCL ER (XL) 300 MG PO TB24 Oral Take 300 mg by mouth daily.    . FENTANYL 50 MCG/HR TD PT72 Transdermal Place 1 patch onto the skin every 3 (three) days.    Marland Kitchen FEXOFENADINE HCL 180 MG PO TABS Oral Take 180 mg by mouth daily.    Marland Kitchen FLUCONAZOLE 100 MG PO TABS Oral Take 1 tablet (100 mg total) by mouth daily. 4 tablet 0  . HYDROCHLOROTHIAZIDE 12.5 MG PO CAPS Oral Take 12.5 mg by mouth daily.    Marland Kitchen HYDROCODONE-ACETAMINOPHEN 5-325 MG PO TABS Oral Take 1 tablet by mouth every 8 (eight) hours as needed for pain. pain 30 tablet 0  . LEVOTHYROXINE SODIUM 25 MCG PO TABS Oral Take 25 mcg by mouth daily.    Marland Kitchen ONDANSETRON HCL 4 MG PO TABS Oral Take 1 tablet (4 mg total) by mouth every 6 (  six) hours as needed for nausea. 20 tablet 0  . OXYCODONE HCL 5 MG PO TABS Oral Take 1 tablet (5 mg total) by mouth every 4 (four) hours as needed for pain. 30 tablet 0  . PSYLLIUM 58.6 % PO PACK Oral Take 1 packet by mouth daily.    Marland Kitchen TIOTROPIUM BROMIDE MONOHYDRATE 18 MCG IN CAPS Inhalation Place 1 capsule (18 mcg total) into inhaler and inhale daily. 30 capsule 0  . TRAZODONE HCL 50 MG PO TABS Oral Take 50 mg by mouth at bedtime.    Marland Kitchen VANCOMYCIN 50 MG/ML ORAL SOLUTION Oral Take 2.5 mLs (125 mg total) by mouth 4 (four) times daily. 120 mL 0    BP 111/52  Pulse 92  Temp 98.3 F (36.8 C) (Oral)  Resp 18  Wt 88 lb (39.917 kg)  SpO2 92%  Physical Exam  Nursing note and vitals reviewed. Constitutional: She is oriented to person, place, and time. She appears well-developed and well-nourished. No distress.  HENT:  Head: Normocephalic and atraumatic.  Eyes: EOM are normal. Pupils are equal, round, and reactive to light.  Neck: Normal range of motion. Neck supple.       C-spine nontender.  No cervical spine step-offs.  C-spine  cleared by Nexus criteria.  Cardiovascular: Normal rate, regular rhythm and normal heart sounds.   Pulmonary/Chest: Effort normal and breath sounds normal.  Abdominal: Soft. She exhibits no distension. There is no tenderness.  Musculoskeletal: Normal range of motion.       Left leg is shortened and internally rotated.  Normal pulses her left foot.  Pain with range of motion of her left hip.  Neurological: She is alert and oriented to person, place, and time.  Skin: Skin is warm and dry.  Psychiatric: She has a normal mood and affect. Judgment normal.    ED Course  Procedures (including critical care time)   Date: 10/07/2012  Rate: 94  Rhythm: normal sinus rhythm  QRS Axis: normal  Intervals: normal  ST/T Wave abnormalities: normal  Conduction Disutrbances: none  Narrative Interpretation:   Old EKG Reviewed: No significant changes noted     Labs Reviewed  CBC WITH DIFFERENTIAL - Abnormal; Notable for the following:    WBC 22.5 (*)     RBC 3.10 (*)     Hemoglobin 10.2 (*)     HCT 31.0 (*)     Platelets 509 (*)     Neutrophils Relative 91 (*)     Neutro Abs 20.4 (*)     Lymphocytes Relative 3 (*)     Monocytes Absolute 1.5 (*)     All other components within normal limits  PROTIME-INR  URINALYSIS, MICROSCOPIC ONLY  BASIC METABOLIC PANEL  TYPE AND SCREEN  URINE CULTURE   Dg Chest 1 View  10/07/2012  *RADIOLOGY REPORT*  Clinical Data: Hip pain  CHEST - 1 VIEW  Comparison: 09/12/2012  Findings: Heart size is normal.  There is no pleural effusion or edema.  Left base opacities appear new from previous exam and is suspicious for pneumonia.  Stable scar like densities identified within the left lower lobe.  IMPRESSION:  1.  Left base opacity appears new from previous exam.  Suspicious for pneumonia.  Recommend follow-up imaging with dedicated PA and lateral chest radiograph.   Original Report Authenticated By: Rosealee Albee, M.D.    Dg Hip Complete Left  10/07/2012   *RADIOLOGY REPORT*  Clinical Data: Hip pain  LEFT HIP - COMPLETE 2+ VIEW  Comparison: None  Findings: There is a slightly impacted left femoral neck fracture. Mild varus angulation of the distal fracture fragments.  No subluxation.  IMPRESSION:  1.  Impacted left femoral neck fracture.   Original Report Authenticated By: Rosealee Albee, M.D.     I personally reviewed the imaging tests through PACS system  I reviewed available ER/hospitalization records thought the EMR   1. Hip fracture, left       MDM  The patient has an impacted left femoral neck fracture.  She'll be admitted by the hospitalist service.  Orthopedic consult pending at this time.  Her pain is controlled.  N.p.o.        Lyanne Co, MD 10/07/12 [phone]

## 2012-10-07 NOTE — ED Notes (Signed)
As per EMS pt sts she fell at 3am this morning. Pt sts when she fell she landed on left hip. Left leg appeared to be shortened.No N/V.pt has good pedal pulses.pt receive fentanyl.pt sate pain is now 5 to 6.

## 2012-10-08 LAB — BASIC METABOLIC PANEL
Calcium: 8.3 mg/dL — ABNORMAL LOW (ref 8.4–10.5)
GFR calc Af Amer: >90 mL/min (ref >90)
GFR calc non Af Amer: >90 mL/min (ref >90)
Glucose, Bld: 70 mg/dL (ref 70–99)
Potassium: 4.1 mEq/L (ref 3.5–5.1)
Sodium: 136 mEq/L (ref 135–145)

## 2012-10-08 LAB — URINE CULTURE
Colony Count: NO GROWTH
Culture: NO GROWTH

## 2012-10-08 LAB — CBC
Hemoglobin: 9.6 g/dL — ABNORMAL LOW (ref 12.0–15.0)
MCH: 33.4 pg (ref 26.0–34.0)
MCHC: 33.1 g/dL (ref 30.0–36.0)
Platelets: 500 10*3/uL — ABNORMAL HIGH (ref 150–400)
RDW: 12.9 % (ref 11.5–15.5)

## 2012-10-08 MED ORDER — CLINDAMYCIN PHOSPHATE 900 MG/50ML IV SOLN
900.0000 mg | INTRAVENOUS | Status: AC
  Administered 2012-10-10: 900 mg via INTRAVENOUS
  Filled 2012-10-08 (×2): qty 50

## 2012-10-08 MED ORDER — METHOCARBAMOL 500 MG PO TABS
500.0000 mg | ORAL_TABLET | Freq: Three times a day (TID) | ORAL | Status: DC | PRN
  Administered 2012-10-10 – 2012-10-11 (×2): 500 mg via ORAL
  Filled 2012-10-08 (×2): qty 1

## 2012-10-08 NOTE — Progress Notes (Signed)
Clinical Social Work Department BRIEF PSYCHOSOCIAL ASSESSMENT 10/08/2012  Patient:  [name], [name]     Account Number:  [id number]     Admit date:  10/07/2012  Clinical Social Worker:  Orpah Greek  Date/Time:  10/08/2012 03:30 PM  Referred by:  Physician  Date Referred:  10/08/2012 Referred for  SNF Placement   Other Referral:   Interview type:  Family Other interview type:    PSYCHOSOCIAL DATA Living Status:  FACILITY Admitted from facility:  [facility] Level of care:  Independent Living Primary support name:  [name] (son) h#: [phone] c#: [phone] Primary support relationship to patient:  CHILD, ADULT Degree of support available:   good    CURRENT CONCERNS Current Concerns  Post-Acute Placement   Other Concerns:    SOCIAL WORK ASSESSMENT / PLAN CSW spoke with patient's son, [name] at bedside re: discharge planning. Patient was admitted from [facility], son is requesting [facility].   Assessment/plan status:  Information/Referral to [facility] Other assessment/ plan:   Information/referral to community resources:   CSW completed FL2 and faxed out to [facility] - awaiting response from [place] as to whether they would be able to offer a bed. Patient's son also expressed interest in [facility] as she has been residing in their Independent Living prior to hospitalization.    PATIENT'S/FAMILY'S RESPONSE TO PLAN OF CARE: Patient's son is agreeable to SNF as patient would most likely require rehab post-surgery.        Unice Bailey, LCSW [facility] Clinical Social Worker cell #: [phone]

## 2012-10-08 NOTE — Clinical Documentation Improvement (Signed)
Abnormal Labs Clarification  THIS DOCUMENT IS NOT A PERMANENT PART OF THE MEDICAL RECORD  TO RESPOND TO THE THIS QUERY, FOLLOW THE INSTRUCTIONS BELOW:  1. If needed, update documentation for the patient's encounter via the notes activity.  2. Access this query again and click edit on the [employer].  3. After updating, or not, click F2 to complete all highlighted (required) fields concerning your review. Select "additional documentation in the medical record" OR "no additional documentation provided".  4. Click Sign note button.  5. The deficiency will fall out of your InBasket *Please let us know if you are not able to complete this workflow by phone or e-mail (listed below).  Please update your documentation within the medical record to reflect your response to this query.                                                                                   10/08/12  Dear Dr. Susie Cassette, [name]  In a better effort to capture your patient's severity of illness, reflect appropriate length of stay and utilization of resources, a review of the medical record has revealed the following indicators.    Based on your clinical judgment, please clarify and document in a progress note and/or discharge summary the clinical condition associated with the following supporting information:  In responding to this query please exercise your independent judgment.  The fact that a query is asked, does not imply that any particular answer is desired or expected.  Abnormal findings (laboratory, x-ray, pathologic, and other diagnostic results) are not coded and reported unless the physician indicates their clinical significance.   The medical record reflects the following clinical findings, please clarify the diagnostic and/or clinical significance:      Based on your clinical judgment can you provide a diagnosis that represents the below listed clinical indicators?   In this pt with hip fracture and  C. Diff a review of the medical record reveals the following:   Pt with documented infected cat scratch to right ankle per HP  WBCs= 22.5-20.1  Treated with Augmentin per HP  Please clarify if the above clinical indicators qualify for one of the diagnoses listed above and document in pn or d/c summary.   Possible Clinical Conditions?    _________Cat Scratch disease _________ lymphadenitis Other Condition___________________              Cannot Clinically Determine_________                           Supporting Information: Cat scratch to Right ankle (infected)    Risk Factors  Diagnostics Component     Latest Ref Rng 10/07/2012 10/07/2012         7:37 AM 11:48 AM  WBC     4.0 - 10.5 K/uL 22.5 (H) 20.1 (H)   Component     Latest Ref Rng 10/08/2012          WBC     4.0 - 10.5 K/uL 14.5 (H)   Component     Latest Ref Rng 10/07/2012 10/07/2012 10/07/2012 10/07/2012  7:37 AM  7:37 AM 11:48 AM  1:37 PM  WBC     4.0 - 10.5 K/uL 22.5 (H)  20.1 (H)    Component     Latest Ref Rng 10/08/2012          WBC     4.0 - 10.5 K/uL 14.5 (H)    Treatment: Augmentin per HP Reviewed:  no additional documentation provided ljh   Thank You,  Enis Slipper  RN, BSN, MSN/Inf, CCDS Clinical Documentation Specialist [facility] HIM Dept Pager: [phone] / E-mail: [email or web address]  Health Information Management [place]

## 2012-10-08 NOTE — Progress Notes (Signed)
   CARE MANAGEMENT NOTE 10/08/2012  Patient:  [name]   Account Number:  [id number]  Date Initiated:  10/08/2012  Documentation initiated by:  Addalyn Speedy  Subjective/Objective Assessment:   ADMITTED WITH ALTERED MENTAL STATUS, COPD     Action/Plan:   PCP: [facility]  LIVES AT HOME WITH SPOUSE; AWAITING FOR PT/OT EVALS FOR POSSIBLE HHC NEEDS   Anticipated DC Date:  10/15/2012   Anticipated DC Plan:  HOME W HOME HEALTH SERVICES      DC Planning Services  CM consult               Status of service:  In process, will continue to follow Medicare Important Message given?  NA - LOS <3 / Initial given by admissions (If response is "NO", the following Medicare IM given date fields will be blank)  Per UR Regulation:  Reviewed for med. necessity/level of care/duration of stay  Comments:  10/08/2012- B Arnita Koons RN, BSN, MHA  

## 2012-10-08 NOTE — Progress Notes (Addendum)
[facility] PROGRESS NOTE  RICKIYA PICARIELLO WUJ:[id number] DOB: 06-Nov-1932 DOA: 10/07/2012 PCP: [name], MD  Assessment/Plan: Principal Problem:  *Hip fracture Active Problems:  C. difficile colitis  Oral thrush  HTN (hypertension)  COPD (chronic obstructive pulmonary disease)  Healthcare-associated pneumonia     Impacted left femoral neck fracture, orthopedic service from [facility] has been consulted by the EDP, at this time shows multiple ongoing medical issues, follow was likely mechanical. Continue perioperative beta blocker, discussed with patient that she is up moderate to high risk for surgery, patient agreeable to proceed with the surgery,  I personally spoke to Dr Charlann Boxer from [facility] ,  Discussed about operating early next week given PNA on CXR  patient medically stable today to undergo surgery, continue n.p.o. Status    Recent C. difficile colitis, improving, the patient has not had any diarrhea, in fact was constipated, continue with vancomycin taper still , white count has improved   Possible pneumonia, although the patient was recently hospitalized and should be treated for healthcare associated pneumonia, given her recent episode of C. difficile colitis, will hold off on using broad-spectrum antibiotics, will treat with vancomycin IV and Avelox IV     COPD currently stable, without any clinical symptoms. The patient will continue on her nebulizer treatments and Symbicort   Chronic back pain, has a high tolerance for narcotics, on fentanyl patch   History of non-small cell lung cancer this appears to be stable    Moderate to severe malnutrition Given low albumin of 2.0 Will obtain nutrition consult   Code Status: full Family Communication: family updated about patient's clinical progress Disposition Plan:  As above    Brief narrative: This is a 78 year old female, with known history of HTN, depression, COPD,  hypothyroidism, OA, s/p left knee replacement, s/p left artificial shoulder, osteoporosis/vertebral fractures, s/p vertebroplasty x 2, chronic back pain on long-term opioid medication, diverticulosis, non-small cell carcinoma of LUL/LLL s/p XRT,, s/p right cataract surgery 09/24/12, presenting with above symptoms. According to patient, and office notes obtained from primary provider, [name], PA, patient is a resident of [address] [facility], where patient reports she got up this morning to use to restroom when she fell onto her left side injuring her left hip. She reports moderate to severe pain in her left hip. EMS notes a deformity in her left hip. She denies head injury. She denies loss consciousness. She has no neck pain. She has no weakness of her upper lower extremities. She has no chest pain or shortness of breath. The patient has a history of lung cancer and also continues to smoke cigarettes. She has no prior cardiac history. She is on pain meds for chronic back pain. She denies syncope.  She was recently hospitalized and treated for C. difficile colitis and discharged on 10/14. Patient had suffered a cat scratch wound on her right ankle about 2 weeks ago, which appeared to have become infected, so had been started on oral Augumentin by her PMD about a week ago   Consultants:  Orthopedics  Procedures:     Antibiotics:  Avelox  HPI/Subjective: Denies any pain  Objective: Filed Vitals:   10/07/12 2150 10/08/12 0200 10/08/12 0621 10/08/12 0812  BP: 115/57  131/51   Pulse: 85  82   Temp: 98.5 F (36.9 C)  98 F (36.7 C)   TempSrc: Oral  Oral   Resp: 18  16   Height:      Weight:  SpO2: 95% 94% 96% 92%    Intake/Output Summary (Last 24 hours) at 10/08/12 1023 Last data filed at 10/08/12 4132  Gross per 24 hour  Intake      0 ml  Output    400 ml  Net   -400 ml    Exam:  Constitutional: She is oriented to person, place, and time. She appears well-developed  and well-nourished. No distress.  HENT:  Head: Normocephalic and atraumatic.  Eyes: EOM are normal. Pupils are equal, round, and reactive to light.  Neck: Normal range of motion. Neck supple.  C-spine nontender. No cervical spine step-offs. C-spine cleared by Nexus criteria.  Cardiovascular: Normal rate, regular rhythm and normal heart sounds.  Pulmonary/Chest: Effort normal and breath sounds normal.  Abdominal: Soft. She exhibits no distension. There is no tenderness.  Musculoskeletal: Normal range of motion.  Left leg is shortened and internally rotated. Normal pulses her left foot. Pain with range of motion of her left hip.  Neurological: She is alert and oriented to person, place, and time.  Skin: Skin is warm and dry.  Psychiatric: She has a normal mood and affect. Judgment normal.   Psychiatric: Normal mood and affect. speech and behavior is normal. Judgment and thought content normal. Cognition and memory are normal.     Data Reviewed: Basic Metabolic Panel:  Lab 10/08/12 4401 10/07/12 1148 10/07/12 0737 10/03/12 0401 10/02/12 0355 10/01/12 2001  NA 136 -- 135 140 139 136  K 4.1 -- 4.0 3.6 3.1* 3.4*  CL 101 -- 98 112 107 105  CO2 26 -- 28 21 19  18*  GLUCOSE 70 -- 133* 85 74 91  BUN 7 -- 7 5* 12 15  CREATININE 0.47* -- 0.38* 0.38* 0.47* 0.46*  CALCIUM 8.3* -- 8.1* 7.7* 8.1* 8.6  MG -- 1.7 -- -- 1.8 1.9  PHOS -- -- -- -- -- --    Liver Function Tests:  Lab 10/07/12 1148 10/02/12 0355 10/01/12 2001  AST 10 8 10   ALT 11 6 <5  ALKPHOS 69 104 126*  BILITOT 0.2* 0.1* 0.2*  PROT 5.7* 5.9* 6.2  ALBUMIN 2.0* 2.6* 2.8*    Lab 10/02/12 0355 10/01/12 2001  LIPASE 11 11  AMYLASE -- --   No results found for this basename: AMMONIA:5 in the last 168 hours  CBC:  Lab 10/08/12 0755 10/07/12 1148 10/07/12 0737 10/03/12 0401 10/02/12 0355  WBC 14.5* 20.1* 22.5* 10.9* 17.0*  NEUTROABS -- -- 20.4* -- --  HGB 9.6* 9.7* 10.2* 10.9* 12.9  HCT 29.0* 29.2* 31.0* 32.3* 37.8  MCV  101.0* 100.3* 100.0 97.9 98.4  PLT 500* 466* 509* 460* 515*    Cardiac Enzymes: No results found for this basename: CKTOTAL:5,CKMB:5,CKMBINDEX:5,TROPONINI:5 in the last 168 hours BNP (last 3 results) No results found for this basename: PROBNP:3 in the last 8760 hours   CBG: No results found for this basename: GLUCAP:5 in the last 168 hours  Recent Results (from the past 240 hour(s))  CLOSTRIDIUM DIFFICILE BY PCR     Status: Abnormal   Collection Time   10/01/12  9:12 PM      Component Value Range Status Comment   C difficile by pcr POSITIVE (*) NEGATIVE Final   MRSA PCR SCREENING     Status: Normal   Collection Time   10/01/12  9:12 PM      Component Value Range Status Comment   MRSA by PCR NEGATIVE  NEGATIVE Final   SURGICAL PCR SCREEN  Status: Normal   Collection Time   10/07/12  1:37 PM      Component Value Range Status Comment   MRSA, PCR NEGATIVE  NEGATIVE Final    Staphylococcus aureus NEGATIVE  NEGATIVE Final      Studies: Dg Chest 1 View  10/07/2012  *RADIOLOGY REPORT*  Clinical Data: Hip pain  CHEST - 1 VIEW  Comparison: 09/12/2012  Findings: Heart size is normal.  There is no pleural effusion or edema.  Left base opacities appear new from previous exam and is suspicious for pneumonia.  Stable scar like densities identified within the left lower lobe.  IMPRESSION:  1.  Left base opacity appears new from previous exam.  Suspicious for pneumonia.  Recommend follow-up imaging with dedicated PA and lateral chest radiograph.   Original Report Authenticated By: Rosealee Albee, M.D.    Dg Hip Complete Left  10/07/2012  *RADIOLOGY REPORT*  Clinical Data: Hip pain  LEFT HIP - COMPLETE 2+ VIEW  Comparison: None  Findings: There is a slightly impacted left femoral neck fracture. Mild varus angulation of the distal fracture fragments.  No subluxation.  IMPRESSION:  1.  Impacted left femoral neck fracture.   Original Report Authenticated By: Rosealee Albee, M.D.    Ct  Chest Wo Contrast  09/12/2012  *RADIOLOGY REPORT*  Clinical Data: Left rib pain.  History of lung cancer.  CT CHEST WITHOUT CONTRAST  Technique:  Multidetector CT imaging of the chest was performed following the standard protocol without IV contrast.  Comparison: 02/01/2012.  Findings: Left thyroid nodule measures approximately 2.4 x 2.4 cm, as before.  No pathologically enlarged mediastinal or axillary lymph nodes.  Hilar regions are difficult to definitively evaluate without IV contrast.  Lipomatous hypertrophy of the interatrial septum.  Coronary artery calcification.  Dense mitral annulus calcification.  Heart size normal.  No pericardial effusion.  Biapical pleural parenchymal scarring, right greater than left. Emphysema.  Postoperative changes in the left upper lobe.  A nodular component is seen at the 7 o'clock position (image 18), measuring 5 mm (previously 5 x 4 mm).  Diffuse peribronchovascular nodularity and/or subpleural reticulation, as before. Postoperative changes in the left lower lobe as well, stable. Probable small subpleural lymph node along the right major fissure. No pleural fluid.  Airway is unremarkable.  Incidental imaging of the upper abdomen shows a 2.8 x 2.0 cm low attenuation lesion in the right adrenal gland, stable.  Low attenuation lesion in the left adrenal gland is also likely stable, measuring 2.0 x 1.1 cm.  Streaky densities in the peripheral aspect of the liver are seen deep to an anterior right rib, favoring artifact.  Stones are seen in the kidneys bilaterally.  No worrisome lytic or sclerotic lesions. Stable lower thoracic compression fracture, flanked by upper and lower vertebroplasties.  IMPRESSION:  1.  Postoperative changes in the left upper lobe with a small nodular component, as on prior exams.  Continued attention on follow-up exams is warranted. 2.  Postoperative changes in the left lower lobe, stable. 3.  Diffuse peribronchovascular nodularity and/or subpleural  reticulation, as before, indicating mild fibrosis. 4.  Bilateral adrenal adenomas. 5.  Bilateral renal stones.   Original Report Authenticated By: Reyes Ivan, M.D.     Scheduled Meds:   . bisoprolol  5 mg Oral Daily  . budesonide-formoterol  2 puff Inhalation BID  . buPROPion  300 mg Oral Daily  . enoxaparin (LOVENOX) injection  40 mg Subcutaneous Q24H  . fentaNYL  50  mcg Transdermal Q72H  . fluconazole  100 mg Oral Daily  . levalbuterol  0.63 mg Nebulization Q6H  . levothyroxine  25 mcg Oral QAC breakfast  . moxifloxacin  400 mg Intravenous Q24H  . sodium chloride  3 mL Intravenous Q12H  . tiotropium  18 mcg Inhalation Daily  . traZODone  50 mg Oral QHS  . vancomycin  125 mg Oral QID  . DISCONTD: sodium chloride   Intravenous STAT   Continuous Infusions:   . sodium chloride 50 mL/hr at 10/07/12 1051    Principal Problem:  *Hip fracture Active Problems:  C. difficile colitis  Oral thrush  HTN (hypertension)  COPD (chronic obstructive pulmonary disease)  Healthcare-associated pneumonia    Time spent: 40 minutes   [facility]  [facility] Pager [phone]. If 8PM-8AM, please contact night-coverage at [email or web address], password [facility] 10/08/2012, 10:23 AM  LOS: 1 day

## 2012-10-08 NOTE — Progress Notes (Signed)
INITIAL ADULT NUTRITION ASSESSMENT Date: 10/08/2012   Time: 3:02 PM Reason for Assessment: Low Braden, Low BMI Recent admit for dehydration secondary to c.diff with full RD eval 10/01/12 ASSESSMENT: Female 78 y.o.  Dx: Hip fracture  Hx:  Past Medical History  Diagnosis Date  . lung ca dx'd 08/2010    xrt comp 10/2010  . Hypertension   . Lung cancer     LUL, LLL lung  . Osteoporosis   . Diverticulitis   . Chronic back pain    History reviewed. No pertinent past surgical history.  Related Meds:     . bisoprolol  5 mg Oral Daily  . budesonide-formoterol  2 puff Inhalation BID  . buPROPion  300 mg Oral Daily  . clindamycin (CLEOCIN) IV  900 mg Intravenous 60 min Pre-Op  . enoxaparin (LOVENOX) injection  40 mg Subcutaneous Q24H  . fentaNYL  50 mcg Transdermal Q72H  . fluconazole  100 mg Oral Daily  . levalbuterol  0.63 mg Nebulization Q6H  . levothyroxine  25 mcg Oral QAC breakfast  . moxifloxacin  400 mg Intravenous Q24H  . sodium chloride  3 mL Intravenous Q12H  . tiotropium  18 mcg Inhalation Daily  . traZODone  50 mg Oral QHS  . vancomycin  125 mg Oral QID   Ht: 5\' 2"  (157.5 cm)  Wt: 88 lb (39.917 kg)  Ideal Wt: 50.1 kg  % Ideal Wt: 80  Usual Wt: 92# Wt Readings from Last 10 Encounters:  10/07/12 88 lb (39.917 kg)  10/01/12 89 lb (40.37 kg)  02/03/12 88 lb 14.4 oz (40.325 kg)  08/24/10 90 lb 12.8 oz (41.187 kg)  08/03/10 92 lb 6.4 oz (41.912 kg)  04/27/10 93 lb (42.185 kg)  03/25/10 100 lb 8 oz (45.587 kg)     % Usual Wt: 96  Body mass index is 16.10 kg/(m^2).  Labs:  CMP     Component Value Date/Time   NA 136 10/08/2012 0755   K 4.1 10/08/2012 0755   CL 101 10/08/2012 0755   CO2 26 10/08/2012 0755   GLUCOSE 70 10/08/2012 0755   BUN 7 10/08/2012 0755   CREATININE 0.47* 10/08/2012 0755   CALCIUM 8.3* 10/08/2012 0755   PROT 5.7* 10/07/2012 1148   ALBUMIN 2.0* 10/07/2012 1148   AST 10 10/07/2012 1148   ALT 11 10/07/2012 1148   ALKPHOS 69  10/07/2012 1148   BILITOT 0.2* 10/07/2012 1148   GFRNONAA >90 10/08/2012 0755   GFRAA >90 10/08/2012 0755     I/O last 3 completed shifts: In: -  Out: 400 [Urine:400] Total I/O In: 490 [P.O.:240; IV Piggyback:250] Out: 350 [Urine:350]   Diet Order: General  Supplements/Tube Feeding:  none  IVF:    sodium chloride Last Rate: 50 mL/hr at 10/07/12 1051    Estimated Nutritional Needs:   Kcal: 1400-1600 Protein: 60-80 gm Fluid: 1.4-1.5L  Food/Nutrition Related Hx: Generally ate well prior to admit but unable to gain weight.  Has tried Ensure and Boost in the past but they make her sick.  4# unintended weight loss in the past month (4%).  Pt meets criteria for severe malnutrition of chronic illness AEB decreased body fat and muscle mass, <75% intake in the past week.  NUTRITION DIAGNOSIS: -Inadequate oral intake (NI-2.1).  Status: Ongoing  RELATED TO: decreased appetite  AS EVIDENCE BY: pt and son's report  MONITORING/EVALUATION(Goals): Intake, labs, weight Goal:  Prevention of further weight loss.  EDUCATION NEEDS: -No education needs identified at this time  INTERVENTION: Discussed need for increased nutrition pre op.   Provide preferences [employer] Breakfast with meals  Dietitian [phone]  DOCUMENTATION CODES Per approved criteria  -Severe malnutrition in the context of chronic illness -Underweight    Jobe, Anastasia Fiedler 10/08/2012, 3:02 PM

## 2012-10-08 NOTE — Consult Note (Signed)
Reason for Consult: Left femoral neck fracture   DELAYLA Galvan is an 78 y.o. female.  HPI:  This is a 78 year old female.  According to patient, she is a resident of [address] [facility], where patient reports she got up this morning to use to restroom when she fell onto her left side injuring her left hip. She states nothing out of the ordinary, and states it is the same thing she does every morning, just tripped up a little. She reports moderate to severe pain in her left hip. EMS notes a deformity in her left hip. She denies head injury. She denies loss consciousness. She has no neck pain. She has no weakness of her upper lower extremities. She has no chest pain or shortness of breath. The patient has a history of lung cancer and also continues to smoke cigarettes. She has no prior cardiac history. She is on pain meds for chronic back pain. She denies syncope. The ED consulted Dr. Charlann Boxer for orthopaedic concerns.    Past Medical History  Diagnosis Date  . lung ca dx'd 08/2010    xrt comp 10/2010  . Hypertension   . Lung cancer     LUL, LLL lung  . Osteoporosis   . Diverticulitis   . Chronic back pain     History reviewed. No pertinent past surgical history.  Family History  Problem Relation Age of Onset  . Cancer Mother     lung  . Cancer Father     stomach    Social History:  reports that she has been smoking.  She does not have any smokeless tobacco history on file. She reports that she does not drink alcohol or use illicit drugs.  Allergies:  Allergies  Allergen Reactions  . Aspirin     REACTION: gi upset  . Penicillins Nausea And Vomiting    Medications: I have reviewed the patient's current medications.  Results for orders placed during the hospital encounter of 10/07/12 (from the past 48 hour(s))  URINALYSIS, MICROSCOPIC ONLY     Status: Normal   Collection Time   10/07/12  6:43 AM      Component Value Range Comment   Color, Urine YELLOW  YELLOW    APPearance CLEAR  CLEAR    Specific Gravity, Urine 1.014  1.005 - 1.030    pH 6.0  5.0 - 8.0    Glucose, UA NEGATIVE  NEGATIVE mg/dL    Hgb urine dipstick NEGATIVE  NEGATIVE    Bilirubin Urine NEGATIVE  NEGATIVE    Ketones, ur NEGATIVE  NEGATIVE mg/dL    Protein, ur NEGATIVE  NEGATIVE mg/dL    Urobilinogen, UA 0.2  0.0 - 1.0 mg/dL    Nitrite NEGATIVE  NEGATIVE    Leukocytes, UA NEGATIVE  NEGATIVE    Squamous Epithelial / LPF RARE  RARE   BASIC METABOLIC PANEL     Status: Abnormal   Collection Time   10/07/12  7:37 AM      Component Value Range Comment   Sodium 135  135 - 145 mEq/L    Potassium 4.0  3.5 - 5.1 mEq/L    Chloride 98  96 - 112 mEq/L    CO2 28  19 - 32 mEq/L    Glucose, Bld 133 (*) 70 - 99 mg/dL    BUN 7  6 - 23 mg/dL    Creatinine, Ser 1.61 (*) 0.50 - 1.10 mg/dL    Calcium 8.1 (*) 8.4 - 10.5 mg/dL  GFR calc non Af Amer >90  >90 mL/min    GFR calc Af Amer >90  >90 mL/min   CBC WITH DIFFERENTIAL     Status: Abnormal   Collection Time   10/07/12  7:37 AM      Component Value Range Comment   WBC 22.5 (*) 4.0 - 10.5 K/uL    RBC 3.10 (*) 3.87 - 5.11 MIL/uL    Hemoglobin 10.2 (*) 12.0 - 15.0 g/dL    HCT 78.2 (*) 95.6 - 46.0 %    MCV 100.0  78.0 - 100.0 fL    MCH 32.9  26.0 - 34.0 pg    MCHC 32.9  30.0 - 36.0 g/dL    RDW 21.3  08.6 - 57.8 %    Platelets 509 (*) 150 - 400 K/uL    Neutrophils Relative 91 (*) 43 - 77 %    Neutro Abs 20.4 (*) 1.7 - 7.7 K/uL    Lymphocytes Relative 3 (*) 12 - 46 %    Lymphs Abs 0.7  0.7 - 4.0 K/uL    Monocytes Relative 7  3 - 12 %    Monocytes Absolute 1.5 (*) 0.1 - 1.0 K/uL    Eosinophils Relative 0  0 - 5 %    Eosinophils Absolute 0.0  0.0 - 0.7 K/uL    Basophils Relative 0  0 - 1 %    Basophils Absolute 0.0  0.0 - 0.1 K/uL   PROTIME-INR     Status: Normal   Collection Time   10/07/12  7:37 AM      Component Value Range Comment   Prothrombin Time 13.9  11.6 - 15.2 seconds    INR 1.08  0.00 - 1.49   TYPE AND SCREEN     Status:  Normal   Collection Time   10/07/12  7:37 AM      Component Value Range Comment   ABO/RH(D) O POS      Antibody Screen NEG      Sample Expiration 10/10/2012     ABO/RH     Status: Normal   Collection Time   10/07/12  7:37 AM      Component Value Range Comment   ABO/RH(D) O POS     CBC     Status: Abnormal   Collection Time   10/07/12 11:48 AM      Component Value Range Comment   WBC 20.1 (*) 4.0 - 10.5 K/uL    RBC 2.91 (*) 3.87 - 5.11 MIL/uL    Hemoglobin 9.7 (*) 12.0 - 15.0 g/dL    HCT 46.9 (*) 62.9 - 46.0 %    MCV 100.3 (*) 78.0 - 100.0 fL    MCH 33.3  26.0 - 34.0 pg    MCHC 33.2  30.0 - 36.0 g/dL    RDW 52.8  41.3 - 24.4 %    Platelets 466 (*) 150 - 400 K/uL   HEPATIC FUNCTION PANEL     Status: Abnormal   Collection Time   10/07/12 11:48 AM      Component Value Range Comment   Total Protein 5.7 (*) 6.0 - 8.3 g/dL    Albumin 2.0 (*) 3.5 - 5.2 g/dL    AST 10  0 - 37 U/L    ALT 11  0 - 35 U/L    Alkaline Phosphatase 69  39 - 117 U/L    Total Bilirubin 0.2 (*) 0.3 - 1.2 mg/dL    Bilirubin, Direct <0.1  0.0 -  0.3 mg/dL    Indirect Bilirubin NOT CALCULATED  0.3 - 0.9 mg/dL   MAGNESIUM     Status: Normal   Collection Time   10/07/12 11:48 AM      Component Value Range Comment   Magnesium 1.7  1.5 - 2.5 mg/dL   TSH     Status: Normal   Collection Time   10/07/12 11:48 AM      Component Value Range Comment   TSH 1.549  0.350 - 4.500 uIU/mL   SURGICAL PCR SCREEN     Status: Normal   Collection Time   10/07/12  1:37 PM      Component Value Range Comment   MRSA, PCR NEGATIVE  NEGATIVE    Staphylococcus aureus NEGATIVE  NEGATIVE   CBC     Status: Abnormal   Collection Time   10/08/12  7:55 AM      Component Value Range Comment   WBC 14.5 (*) 4.0 - 10.5 K/uL    RBC 2.87 (*) 3.87 - 5.11 MIL/uL    Hemoglobin 9.6 (*) 12.0 - 15.0 g/dL    HCT 62.1 (*) 30.8 - 46.0 %    MCV 101.0 (*) 78.0 - 100.0 fL    MCH 33.4  26.0 - 34.0 pg    MCHC 33.1  30.0 - 36.0 g/dL    RDW 65.7   84.6 - 96.2 %    Platelets 500 (*) 150 - 400 K/uL   BASIC METABOLIC PANEL     Status: Abnormal   Collection Time   10/08/12  7:55 AM      Component Value Range Comment   Sodium 136  135 - 145 mEq/L    Potassium 4.1  3.5 - 5.1 mEq/L    Chloride 101  96 - 112 mEq/L    CO2 26  19 - 32 mEq/L    Glucose, Bld 70  70 - 99 mg/dL    BUN 7  6 - 23 mg/dL    Creatinine, Ser 9.52 (*) 0.50 - 1.10 mg/dL    Calcium 8.3 (*) 8.4 - 10.5 mg/dL    GFR calc non Af Amer >90  >90 mL/min    GFR calc Af Amer >90  >90 mL/min     Dg Chest 1 View  10/07/2012  *RADIOLOGY REPORT*  Clinical Data: Hip pain  CHEST - 1 VIEW  Comparison: 09/12/2012  Findings: Heart size is normal.  There is no pleural effusion or edema.  Left base opacities appear new from previous exam and is suspicious for pneumonia.  Stable scar like densities identified within the left lower lobe.  IMPRESSION:  1.  Left base opacity appears new from previous exam.  Suspicious for pneumonia.  Recommend follow-up imaging with dedicated PA and lateral chest radiograph.   Original Report Authenticated By: Rosealee Albee, M.D.    Dg Hip Complete Left  10/07/2012  *RADIOLOGY REPORT*  Clinical Data: Hip pain  LEFT HIP - COMPLETE 2+ VIEW  Comparison: None  Findings: There is a slightly impacted left femoral neck fracture. Mild varus angulation of the distal fracture fragments.  No subluxation.  IMPRESSION:  1.  Impacted left femoral neck fracture.   Original Report Authenticated By: Rosealee Albee, M.D.     ROS Blood pressure 131/51, pulse 82, temperature 98 F (36.7 C), temperature source Oral, resp. rate 16, height 5\' 2"  (1.575 m), weight 39.917 kg (88 lb), SpO2 92.00%. Physical Exam  Constitutional: She is oriented to person, place, and time.  She appears well-developed and well-nourished.  HENT:  Head: Normocephalic and atraumatic.  Nose: Nose normal.  Mouth/Throat: Oropharynx is clear and moist.  Eyes: Pupils are equal, round, and reactive to  light.  Neck: Neck supple. No JVD present. No tracheal deviation present. No thyromegaly present.  Cardiovascular: Normal rate, regular rhythm and intact distal pulses.   Respiratory: Effort normal and breath sounds normal. No respiratory distress.  GI: Soft. There is no tenderness. There is no guarding.  Musculoskeletal:       Left hip: She exhibits decreased range of motion, decreased strength, tenderness, bony tenderness, swelling and deformity.  Neurological: She is alert and oriented to person, place, and time.  Skin: Skin is warm and dry.  Psychiatric: She has a normal mood and affect.    Assessment/Plan: Left femoral neck fracture needing surgical fixation with a left hip hemiarthroplasty  Bed rest Plan is to bring her to the OR on Wednesday Risks, benefits and expectations were discussed with the patient. Patient understand the risks, benefits and expectations and wishes to proceed with surgery.  Risks including but not limited to blood clots, blood vessel and/or nerve damage, infection, failure of the prosthesis, risks associated with anesthesia and up to and including death. She again understand and wishes to proceed. She can have a regular diet until that time.  She will be NPO Wednesday morning until after surgery Will start Lovenox to prevent blood clot today and tomorrow, stopping after Tuesday dose until after surgery   Gerrit Halls 10/08/2012, 10:55 AM

## 2012-10-08 NOTE — Progress Notes (Signed)
Clinical Social Work Department CLINICAL SOCIAL WORK PLACEMENT NOTE 10/08/2012  Patient:  [name], [name]  Account Number:  [id number] Admit date:  10/07/2012  Clinical Social Worker:  Orpah Greek  Date/time:  10/08/2012 03:47 PM  Clinical Social Work is seeking post-discharge placement for this patient at the following level of care:   SKILLED NURSING   (*CSW will update this form in Epic as items are completed)   10/08/2012  Patient/family provided with [facility] Department of Clinical Social Work's list of facilities offering this level of care within the geographic area requested by the patient (or if unable, by the patient's family).  10/08/2012  Patient/family informed of their freedom to choose among providers that offer the needed level of care, that participate in Medicare, Medicaid or managed care program needed by the patient, have an available bed and are willing to accept the patient.  10/08/2012  Patient/family informed of [facility]' ownership interest in [facility], as well as of the fact that they are under no obligation to receive care at this facility.  PASARR submitted to EDS on 10/08/2012 PASARR number received from EDS on   FL2 transmitted to all facilities in geographic area requested by pt/family on  10/08/2012 FL2 transmitted to all facilities within larger geographic area on   Patient informed that his/her managed care company has contracts with or will negotiate with  certain facilities, including the following:     Patient/family informed of bed offers received:   Patient chooses bed at  Physician recommends and patient chooses bed at    Patient to be transferred to  on   Patient to be transferred to facility by   The following physician request were entered in Epic:   Additional Comments:  Unice Bailey, LCSW [facility] Clinical Social Worker cell #: [phone]

## 2012-10-08 NOTE — Clinical Documentation Improvement (Signed)
MALNUTRITION DOCUMENTATION CLARIFICATION  THIS DOCUMENT IS NOT A PERMANENT PART OF THE MEDICAL RECORD  TO RESPOND TO THE THIS QUERY, FOLLOW THE INSTRUCTIONS BELOW:  1. If needed, update documentation for the patient's encounter via the notes activity.  2. Access this query again and click edit on the In [employer].  3. After updating, or not, click F2 to complete all highlighted (required) fields concerning your review. Select "additional documentation in the medical record" OR "no additional documentation provided".  4. Click Sign note button.  5. The deficiency will fall out of your In Basket *Please let us know if you are not able to complete this workflow by phone or e-mail (listed below).  Please update your documentation within the medical record to reflect your response to this query.                                                                                        10/08/12   Dear [facility] [place], [facility],  In a better effort to capture your patient's severity of illness, reflect appropriate length of stay and utilization of resources, a review of the patient medical record has revealed the following indicators.    Based on your clinical judgment, please clarify and document in a progress note and/or discharge summary the clinical condition associated with the following supporting information:  In responding to this query please exercise your independent judgment.  The fact that a query is asked, does not imply that any particular answer is desired or expected.  Based on your clinical judgment can you provide a diagnosis that represents the below listed clinical indicators?  In this pt with hip fracture and C. Diff a review of the medical record reveals the following:   BMI=16.10  WT=88 lbs  HT=5'2"  Meets criteria for Severe malnutrition nutritional note 10/02/12  Albumin 2.0 per lab result on 10/07/12  Please clarify if the above clinical indicators qualify  for any of the diagnoses listed below and document in pn or d/c summary.  Thank you for all that you do for our patients!  Possible Clinical Conditions?   _______Severe Malnutrition   _______Protein Calorie Malnutrition _______Severe Protein Calorie Malnutrition  _______Other Condition________________ _______Cannot clinically determine     Supporting Information: Risk Factors: Hip fracture, C. Diff, Oral thrush, HTN Signs & Symptoms: Nutritional Note 10/02/12: previous admission Pt meets criteria for severe malnutrition of acute illness AEB <50% estimated energy intake for the past week with 3.2% weight loss in the past 3 weeks and pt with moderate muscle and fat loss.   Severe malnutrition in the context of acute illness or injury  -Underweight    Marshall Cork 10/02/2012, 12:04 PM    Diagnostics: Component     Latest Ref Rng 10/07/2012        11:48 AM  Total Protein     6.0 - 8.3 g/dL 5.7 (L)  Albumin     3.5 - 5.2 g/dL 2.0 (L)     Treatment  Monitoring Regular diet changed to NPO   You may use possible, probable, or suspect with inpatient documentation. possible, probable, suspected diagnoses MUST be documented  at the time of discharge  Reviewed: additional documentation in the medical record ljh Thank You,  Enis Slipper  RN, BSN, MSN/Inf, CCDS Clinical Documentation Specialist [facility] Dept Pager: [phone] / E-mail: [email or web address]  Health Information Management [place]

## 2012-10-09 MED ORDER — ENOXAPARIN SODIUM 40 MG/0.4ML ~~LOC~~ SOLN
40.0000 mg | Freq: Once | SUBCUTANEOUS | Status: AC
  Administered 2012-10-09: 40 mg via SUBCUTANEOUS
  Filled 2012-10-09: qty 0.4

## 2012-10-09 MED ORDER — HYDROCOD POLST-CHLORPHEN POLST 10-8 MG/5ML PO LQCR
5.0000 mL | Freq: Two times a day (BID) | ORAL | Status: DC
  Administered 2012-10-09 – 2012-10-12 (×7): 5 mL via ORAL
  Filled 2012-10-09 (×7): qty 5

## 2012-10-09 MED ORDER — SENNOSIDES-DOCUSATE SODIUM 8.6-50 MG PO TABS
2.0000 | ORAL_TABLET | Freq: Every day | ORAL | Status: DC
  Administered 2012-10-09 – 2012-10-11 (×3): 2 via ORAL
  Filled 2012-10-09 (×4): qty 2

## 2012-10-09 MED ORDER — POLYETHYLENE GLYCOL 3350 17 G PO PACK
17.0000 g | PACK | Freq: Every day | ORAL | Status: DC | PRN
Start: 2012-10-09 — End: 2012-10-12
  Filled 2012-10-09: qty 1

## 2012-10-09 NOTE — Progress Notes (Signed)
   Subjective: Left femoral neck fracture.  Patient reports pain as moderate, pain controlled with medication. No events throughout the night.  Objective:   VITALS:   Filed Vitals:   10/09/12 1010  BP: 134/65  Pulse: 85  Temp: 98.2 F (36.8 C)  Resp: 20    Neurovascular intact Dorsiflexion/Plantar flexion intact No cellulitis present Compartment soft  LABS  Basename 10/08/12 0755 10/07/12 1148 10/07/12 0737  HGB 9.6* 9.7* 10.2*  HCT 29.0* 29.2* 31.0*  WBC 14.5* 20.1* 22.5*  PLT 500* 466* 509*     Basename 10/08/12 0755 10/07/12 0737  NA 136 135  K 4.1 4.0  BUN 7 7  CREATININE 0.47* 0.38*  GLUCOSE 70 133*     Assessment/Plan: Left femoral neck fracture   Patient doing well now with medications to control pain. Will be NPO after midnight with plans for surgery tomorrow (left hip hemiarthroplasty). Last dose of Lovenox today, until after surgery. I have discussed the case with the son who states that he is the power of attorney. He understands the risks, benefits and expectations as were discussed with him and the patient. He and the patient understand the risks, benefits and expectations and wishes to proceed with surgery.     Anastasio Auerbach Tasman Zapata   PAC  10/09/2012, 12:44 PM

## 2012-10-09 NOTE — Progress Notes (Signed)
[facility] PROGRESS NOTE  Dillie Vertiz Thrush ZOX:[id number] DOB: 26-Jan-1932 DOA: 10/07/2012 PCP: [name], MD  Assessment/Plan: Principal Problem:  *Hip fracture Active Problems:  C. difficile colitis  Oral thrush  HTN (hypertension)  COPD (chronic obstructive pulmonary disease)  Healthcare-associated pneumonia    Impacted left femoral neck fracture, orthopedic service from [facility] has been consulted , at this time shows multiple ongoing medical issues, follow was likely mechanical. Continue perioperative beta blocker, discussed with patient that she is up moderate to high risk for surgery, patient agreeable to proceed with the surgery,  I personally spoke to Dr Charlann Boxer from [facility] , likely to operate on Wednesday Surgery has been delayed given her elevated white count and left lower lobe pneumonia patient medically stable today to undergo surgery, continue n.p.o. Status    Recent C. difficile colitis, improving, the patient has not had any diarrhea, in fact was constipated, continue with vancomycin taper still , white count has improved   Possible pneumonia, although the patient was recently hospitalized and should be treated for healthcare associated pneumonia, given her recent episode of C. difficile colitis, will hold off on using broad-spectrum antibiotics, will treat with Avelox IV   COPD currently stable, without any clinical symptoms. The patient will continue on her nebulizer treatments and Symbicort    Chronic back pain, has a high tolerance for narcotics, on fentanyl patch   History of non-small cell lung cancer this appears to be stable    Moderate to severe malnutrition  Given low albumin of 2.0  Will obtain nutrition consult      Code Status: full Family Communication: family updated about patient's clinical progress Disposition Plan:  As above    Brief narrative: This is a 78 year old female. According to  patient, she is a resident of [address] [facility], where patient reports she got up this morning to use to restroom when she fell onto her left side injuring her left hip. She states nothing out of the ordinary, and states it is the same thing she does every morning, just tripped up a little. She reports moderate to severe pain in her left hip. EMS notes a deformity in her left hip. She denies head injury. She denies loss consciousness. She has no neck pain. She has no weakness of her upper lower extremities. She has no chest pain or shortness of breath. The patient has a history of lung cancer and also continues to smoke cigarettes. She has no prior cardiac history. She is on pain meds for chronic back pain. She denies syncope. The ED consulted Dr. Charlann Boxer for orthopaedic concerns   Consultants: Shelda Pal, MD    Procedures:  1  Antibiotics:  Avelox since admission  HPI/Subjective: No acute distress  Objective: Filed Vitals:   10/08/12 1418 10/08/12 2140 10/09/12 0224 10/09/12 0551  BP:  136/62  126/69  Pulse:  87  83  Temp:  99.1 F (37.3 C)  99 F (37.2 C)  TempSrc:  Oral  Oral  Resp:  16 18 16   Height:      Weight:      SpO2: 92% 95%  96%    Intake/Output Summary (Last 24 hours) at 10/09/12 0836 Last data filed at 10/09/12 0553  Gross per 24 hour  Intake    613 ml  Output   1300 ml  Net   -687 ml    Exam:  Constitutional: She is oriented to person, place, and time. She appears  well-developed and well-nourished. No distress.  HENT:  Head: Normocephalic and atraumatic.  Eyes: EOM are normal. Pupils are equal, round, and reactive to light.  Neck: Normal range of motion. Neck supple.  C-spine nontender. No cervical spine step-offs. C-spine cleared by Nexus criteria.  Cardiovascular: Normal rate, regular rhythm and normal heart sounds.  Pulmonary/Chest: Effort normal and breath sounds normal.  Abdominal: Soft. She exhibits no distension. There is no tenderness.    Musculoskeletal: Normal range of motion.  Left leg is shortened and internally rotated. Normal pulses her left foot. Pain with range of motion of her left hip.  Neurological: She is alert and oriented to person, place, and time.  Skin: Skin is warm and dry.  Psychiatric: She has a normal mood and affect. Judgment normal.   Psychiatric: Normal mood and affect. speech and behavior is normal. Judgment and thought content normal. Cognition and memory are normal    Data Reviewed: Basic Metabolic Panel:  Lab 10/08/12 3086 10/07/12 1148 10/07/12 0737 10/03/12 0401  NA 136 -- 135 140  K 4.1 -- 4.0 3.6  CL 101 -- 98 112  CO2 26 -- 28 21  GLUCOSE 70 -- 133* 85  BUN 7 -- 7 5*  CREATININE 0.47* -- 0.38* 0.38*  CALCIUM 8.3* -- 8.1* 7.7*  MG -- 1.7 -- --  PHOS -- -- -- --    Liver Function Tests:  Lab 10/07/12 1148  AST 10  ALT 11  ALKPHOS 69  BILITOT 0.2*  PROT 5.7*  ALBUMIN 2.0*   No results found for this basename: LIPASE:5,AMYLASE:5 in the last 168 hours No results found for this basename: AMMONIA:5 in the last 168 hours  CBC:  Lab 10/08/12 0755 10/07/12 1148 10/07/12 0737 10/03/12 0401  WBC 14.5* 20.1* 22.5* 10.9*  NEUTROABS -- -- 20.4* --  HGB 9.6* 9.7* 10.2* 10.9*  HCT 29.0* 29.2* 31.0* 32.3*  MCV 101.0* 100.3* 100.0 97.9  PLT 500* 466* 509* 460*    Cardiac Enzymes: No results found for this basename: CKTOTAL:5,CKMB:5,CKMBINDEX:5,TROPONINI:5 in the last 168 hours BNP (last 3 results) No results found for this basename: PROBNP:3 in the last 8760 hours   CBG: No results found for this basename: GLUCAP:5 in the last 168 hours  Recent Results (from the past 240 hour(s))  CLOSTRIDIUM DIFFICILE BY PCR     Status: Abnormal   Collection Time   10/01/12  9:12 PM      Component Value Range Status Comment   C difficile by pcr POSITIVE (*) NEGATIVE Final   MRSA PCR SCREENING     Status: Normal   Collection Time   10/01/12  9:12 PM      Component Value Range Status  Comment   MRSA by PCR NEGATIVE  NEGATIVE Final   URINE CULTURE     Status: Normal   Collection Time   10/07/12  6:43 AM      Component Value Range Status Comment   Specimen Description URINE, CATHETERIZED   Final    Special Requests NONE   Final    Culture  Setup Time 10/07/2012 17:27   Final    Colony Count NO GROWTH   Final    Culture NO GROWTH   Final    Report Status 10/08/2012 FINAL   Final   SURGICAL PCR SCREEN     Status: Normal   Collection Time   10/07/12  1:37 PM      Component Value Range Status Comment   MRSA, PCR NEGATIVE  NEGATIVE Final    Staphylococcus aureus NEGATIVE  NEGATIVE Final      Studies: Dg Chest 1 View  10/07/2012  *RADIOLOGY REPORT*  Clinical Data: Hip pain  CHEST - 1 VIEW  Comparison: 09/12/2012  Findings: Heart size is normal.  There is no pleural effusion or edema.  Left base opacities appear new from previous exam and is suspicious for pneumonia.  Stable scar like densities identified within the left lower lobe.  IMPRESSION:  1.  Left base opacity appears new from previous exam.  Suspicious for pneumonia.  Recommend follow-up imaging with dedicated PA and lateral chest radiograph.   Original Report Authenticated By: Rosealee Albee, M.D.    Dg Hip Complete Left  10/07/2012  *RADIOLOGY REPORT*  Clinical Data: Hip pain  LEFT HIP - COMPLETE 2+ VIEW  Comparison: None  Findings: There is a slightly impacted left femoral neck fracture. Mild varus angulation of the distal fracture fragments.  No subluxation.  IMPRESSION:  1.  Impacted left femoral neck fracture.   Original Report Authenticated By: Rosealee Albee, M.D.    Ct Chest Wo Contrast  09/12/2012  *RADIOLOGY REPORT*  Clinical Data: Left rib pain.  History of lung cancer.  CT CHEST WITHOUT CONTRAST  Technique:  Multidetector CT imaging of the chest was performed following the standard protocol without IV contrast.  Comparison: 02/01/2012.  Findings: Left thyroid nodule measures approximately 2.4 x 2.4  cm, as before.  No pathologically enlarged mediastinal or axillary lymph nodes.  Hilar regions are difficult to definitively evaluate without IV contrast.  Lipomatous hypertrophy of the interatrial septum.  Coronary artery calcification.  Dense mitral annulus calcification.  Heart size normal.  No pericardial effusion.  Biapical pleural parenchymal scarring, right greater than left. Emphysema.  Postoperative changes in the left upper lobe.  A nodular component is seen at the 7 o'clock position (image 18), measuring 5 mm (previously 5 x 4 mm).  Diffuse peribronchovascular nodularity and/or subpleural reticulation, as before. Postoperative changes in the left lower lobe as well, stable. Probable small subpleural lymph node along the right major fissure. No pleural fluid.  Airway is unremarkable.  Incidental imaging of the upper abdomen shows a 2.8 x 2.0 cm low attenuation lesion in the right adrenal gland, stable.  Low attenuation lesion in the left adrenal gland is also likely stable, measuring 2.0 x 1.1 cm.  Streaky densities in the peripheral aspect of the liver are seen deep to an anterior right rib, favoring artifact.  Stones are seen in the kidneys bilaterally.  No worrisome lytic or sclerotic lesions. Stable lower thoracic compression fracture, flanked by upper and lower vertebroplasties.  IMPRESSION:  1.  Postoperative changes in the left upper lobe with a small nodular component, as on prior exams.  Continued attention on follow-up exams is warranted. 2.  Postoperative changes in the left lower lobe, stable. 3.  Diffuse peribronchovascular nodularity and/or subpleural reticulation, as before, indicating mild fibrosis. 4.  Bilateral adrenal adenomas. 5.  Bilateral renal stones.   Original Report Authenticated By: Reyes Ivan, M.D.     Scheduled Meds:   . bisoprolol  5 mg Oral Daily  . budesonide-formoterol  2 puff Inhalation BID  . buPROPion  300 mg Oral Daily  . clindamycin (CLEOCIN) IV  900 mg  Intravenous 60 min Pre-Op  . enoxaparin (LOVENOX) injection  40 mg Subcutaneous Q24H  . fentaNYL  50 mcg Transdermal Q72H  . fluconazole  100 mg Oral Daily  . levalbuterol  0.63  mg Nebulization Q6H  . levothyroxine  25 mcg Oral QAC breakfast  . moxifloxacin  400 mg Intravenous Q24H  . sodium chloride  3 mL Intravenous Q12H  . tiotropium  18 mcg Inhalation Daily  . traZODone  50 mg Oral QHS  . vancomycin  125 mg Oral QID   Continuous Infusions:   . sodium chloride 50 mL/hr at 10/09/12 2952    Principal Problem:  *Hip fracture Active Problems:  C. difficile colitis  Oral thrush  HTN (hypertension)  COPD (chronic obstructive pulmonary disease)  Healthcare-associated pneumonia    Time spent: 40 minutes   [facility]  [facility] Pager [phone]. If 8PM-8AM, please contact night-coverage at [email or web address], password [email or web address] 10/09/2012, 8:36 AM  LOS: 2 days

## 2012-10-10 ENCOUNTER — Encounter (HOSPITAL_COMMUNITY): Payer: Self-pay | Admitting: Anesthesiology

## 2012-10-10 ENCOUNTER — Encounter (HOSPITAL_COMMUNITY)
Admission: EM | Disposition: A | Discharge status: Skilled Nursing Facility | Payer: Self-pay | Source: Home / Self Care | Attending: Internal Medicine

## 2012-10-10 ENCOUNTER — Inpatient Hospital Stay (HOSPITAL_COMMUNITY): Payer: Medicare Other | Admitting: Anesthesiology

## 2012-10-10 ENCOUNTER — Inpatient Hospital Stay (HOSPITAL_COMMUNITY): Payer: Medicare Other

## 2012-10-10 DIAGNOSIS — G8929 Other chronic pain: Secondary | ICD-10-CM

## 2012-10-10 DIAGNOSIS — J189 Pneumonia, unspecified organism: Secondary | ICD-10-CM

## 2012-10-10 DIAGNOSIS — J449 Chronic obstructive pulmonary disease, unspecified: Secondary | ICD-10-CM

## 2012-10-10 DIAGNOSIS — A0472 Enterocolitis due to Clostridium difficile, not specified as recurrent: Secondary | ICD-10-CM

## 2012-10-10 DIAGNOSIS — M549 Dorsalgia, unspecified: Secondary | ICD-10-CM

## 2012-10-10 HISTORY — PX: HIP ARTHROPLASTY: SHX981

## 2012-10-10 LAB — BASIC METABOLIC PANEL
BUN: 4 mg/dL — ABNORMAL LOW (ref 6–23)
Calcium: 8.5 mg/dL (ref 8.4–10.5)
Chloride: 97 mEq/L (ref 96–112)
Creatinine, Ser: 0.38 mg/dL — ABNORMAL LOW (ref 0.50–1.10)
GFR calc Af Amer: >90 mL/min (ref >90)
GFR calc non Af Amer: >90 mL/min (ref >90)

## 2012-10-10 LAB — CBC
HCT: 30.9 % — ABNORMAL LOW (ref 36.0–46.0)
MCH: 32.5 pg (ref 26.0–34.0)
MCHC: 32.7 g/dL (ref 30.0–36.0)
MCV: 99.4 fL (ref 78.0–100.0)
RDW: 12.4 % (ref 11.5–15.5)

## 2012-10-10 SURGERY — HEMIARTHROPLASTY, HIP, DIRECT ANTERIOR APPROACH, FOR FRACTURE
Anesthesia: Spinal | Site: Hip | Laterality: Left | Wound class: Clean

## 2012-10-10 MED ORDER — FENTANYL CITRATE 0.05 MG/ML IJ SOLN
INTRAMUSCULAR | Status: DC | PRN
  Administered 2012-10-10 (×5): 50 ug via INTRAVENOUS

## 2012-10-10 MED ORDER — BUPIVACAINE IN DEXTROSE 0.75-8.25 % IT SOLN
INTRATHECAL | Status: DC | PRN
  Administered 2012-10-10: 1.8 mL via INTRATHECAL

## 2012-10-10 MED ORDER — 0.9 % SODIUM CHLORIDE (POUR BTL) OPTIME
TOPICAL | Status: DC | PRN
  Administered 2012-10-10: 1000 mL

## 2012-10-10 MED ORDER — FENTANYL CITRATE 0.05 MG/ML IJ SOLN: 25.0000 ug | INTRAMUSCULAR | Status: DC | PRN

## 2012-10-10 MED ORDER — PROPOFOL 10 MG/ML IV EMUL
INTRAVENOUS | Status: DC | PRN
  Administered 2012-10-10: 50 ug/kg/min via INTRAVENOUS

## 2012-10-10 MED ORDER — PROMETHAZINE HCL 25 MG/ML IJ SOLN: 6.2500 mg | INTRAMUSCULAR | Status: DC | PRN

## 2012-10-10 MED ORDER — HYDROCODONE-ACETAMINOPHEN 5-325 MG PO TABS
1.0000 | ORAL_TABLET | ORAL | Status: DC | PRN
  Administered 2012-10-10 – 2012-10-12 (×5): 2 via ORAL
  Filled 2012-10-10 (×5): qty 2

## 2012-10-10 MED ORDER — PROPOFOL 10 MG/ML IV EMUL
INTRAVENOUS | Status: DC | PRN
  Administered 2012-10-10 (×2): 20 mg via INTRAVENOUS

## 2012-10-10 MED ORDER — ENOXAPARIN SODIUM 40 MG/0.4ML ~~LOC~~ SOLN
40.0000 mg | SUBCUTANEOUS | Status: DC
  Administered 2012-10-11 – 2012-10-12 (×2): 40 mg via SUBCUTANEOUS
  Filled 2012-10-10 (×4): qty 0.4

## 2012-10-10 MED ORDER — LACTATED RINGERS IV SOLN: INTRAVENOUS | Status: DC

## 2012-10-10 MED ORDER — LACTATED RINGERS IV SOLN
INTRAVENOUS | Status: DC | PRN
  Administered 2012-10-10 (×2): via INTRAVENOUS

## 2012-10-10 MED ORDER — FERROUS SULFATE 325 (65 FE) MG PO TABS
325.0000 mg | ORAL_TABLET | Freq: Three times a day (TID) | ORAL | Status: DC
  Administered 2012-10-11 – 2012-10-12 (×4): 325 mg via ORAL
  Filled 2012-10-10 (×8): qty 1

## 2012-10-10 MED ORDER — CLINDAMYCIN PHOSPHATE 600 MG/50ML IV SOLN
600.0000 mg | Freq: Four times a day (QID) | INTRAVENOUS | Status: AC
  Administered 2012-10-10 – 2012-10-11 (×2): 600 mg via INTRAVENOUS
  Filled 2012-10-10 (×2): qty 50

## 2012-10-10 SURGICAL SUPPLY — 52 items
ADH SKN CLS APL DERMABOND .7 (GAUZE/BANDAGES/DRESSINGS) ×2
BAG SPEC THK2 15X12 ZIP CLS (MISCELLANEOUS)
BAG ZIPLOCK 12X15 (MISCELLANEOUS) ×1 IMPLANT
BLADE SAW SGTL 18X1.27X75 (BLADE) ×2 IMPLANT
CLOTH BEACON ORANGE TIMEOUT ST (SAFETY) ×2 IMPLANT
DERMABOND ADVANCED (GAUZE/BANDAGES/DRESSINGS) ×2
DERMABOND ADVANCED .7 DNX12 (GAUZE/BANDAGES/DRESSINGS) IMPLANT
DRAPE INCISE IOBAN 85X60 (DRAPES) ×2 IMPLANT
DRAPE ORTHO SPLIT 77X108 STRL (DRAPES) ×4
DRAPE POUCH INSTRU U-SHP 10X18 (DRAPES) ×2 IMPLANT
DRAPE SURG 17X11 SM STRL (DRAPES) ×2 IMPLANT
DRAPE SURG ORHT 6 SPLT 77X108 (DRAPES) ×2 IMPLANT
DRAPE U-SHAPE 47X51 STRL (DRAPES) ×2 IMPLANT
DRSG AQUACEL AG ADV 3.5X10 (GAUZE/BANDAGES/DRESSINGS) ×2 IMPLANT
DRSG MEPILEX BORDER 4X4 (GAUZE/BANDAGES/DRESSINGS) ×1 IMPLANT
DRSG MEPILEX BORDER 4X8 (GAUZE/BANDAGES/DRESSINGS) ×1 IMPLANT
DURAPREP 26ML APPLICATOR (WOUND CARE) ×3 IMPLANT
ELECT BLADE TIP CTD 4 INCH (ELECTRODE) ×2 IMPLANT
ELECT REM PT RETURN 9FT ADLT (ELECTROSURGICAL) ×2
ELECTRODE REM PT RTRN 9FT ADLT (ELECTROSURGICAL) ×1 IMPLANT
EVACUATOR 1/8 PVC DRAIN (DRAIN) IMPLANT
FACESHIELD LNG OPTICON STERILE (SAFETY) ×7 IMPLANT
GLOVE BIOGEL PI IND STRL 7.5 (GLOVE) ×1 IMPLANT
GLOVE BIOGEL PI IND STRL 8 (GLOVE) ×1 IMPLANT
GLOVE BIOGEL PI INDICATOR 7.5 (GLOVE) ×1
GLOVE BIOGEL PI INDICATOR 8 (GLOVE) ×1
GLOVE ECLIPSE 8.0 STRL XLNG CF (GLOVE) ×1 IMPLANT
GLOVE ORTHO TXT STRL SZ7.5 (GLOVE) ×4 IMPLANT
GLOVE SURG ORTHO 8.0 STRL STRW (GLOVE) ×1 IMPLANT
GLOVE SURG SS PI 8.5 STRL IVOR (GLOVE) ×2
GLOVE SURG SS PI 8.5 STRL STRW (GLOVE) IMPLANT
GOWN BRE IMP PREV XXLGXLNG (GOWN DISPOSABLE) ×4 IMPLANT
GOWN STRL NON-REIN LRG LVL3 (GOWN DISPOSABLE) ×2 IMPLANT
HANDPIECE INTERPULSE COAX TIP (DISPOSABLE)
IMMOBILIZER KNEE 20 (SOFTGOODS) ×2
IMMOBILIZER KNEE 20 THIGH 36 (SOFTGOODS) IMPLANT
KIT BASIN OR (CUSTOM PROCEDURE TRAY) ×2 IMPLANT
MANIFOLD NEPTUNE II (INSTRUMENTS) ×2 IMPLANT
NS IRRIG 1000ML POUR BTL (IV SOLUTION) ×1 IMPLANT
PACK TOTAL JOINT (CUSTOM PROCEDURE TRAY) ×2 IMPLANT
POSITIONER SURGICAL ARM (MISCELLANEOUS) ×2 IMPLANT
SET HNDPC FAN SPRY TIP SCT (DISPOSABLE) IMPLANT
STRIP CLOSURE SKIN 1/2X4 (GAUZE/BANDAGES/DRESSINGS) ×2 IMPLANT
SUT ETHIBOND NAB CT1 #1 30IN (SUTURE) ×1 IMPLANT
SUT MNCRL AB 4-0 PS2 18 (SUTURE) ×2 IMPLANT
SUT VIC AB 1 CT1 36 (SUTURE) ×4 IMPLANT
SUT VIC AB 2-0 CT1 27 (SUTURE) ×4
SUT VIC AB 2-0 CT1 TAPERPNT 27 (SUTURE) ×2 IMPLANT
SUT VLOC 180 0 24IN GS25 (SUTURE) ×1 IMPLANT
TOWEL OR 17X26 10 PK STRL BLUE (TOWEL DISPOSABLE) ×4 IMPLANT
TRAY FOLEY CATH 14FRSI W/METER (CATHETERS) ×1 IMPLANT
WATER STERILE IRR 1500ML POUR (IV SOLUTION) ×1 IMPLANT

## 2012-10-10 NOTE — Progress Notes (Signed)
PCP: Priscilla Boys, MD  Brief HPI: This is a 78 year old female, with known history of HTN, depression, COPD, hypothyroidism, OA, s/p left knee replacement, s/p left artificial shoulder, osteoporosis/vertebral fractures, s/p vertebroplasty x 2, chronic back pain on long-term opioid medication, diverticulosis, non-small cell carcinoma of LUL/LLL s/p XRT,, s/p right cataract surgery 09/24/12, presenting after a fall. According to patient, and office notes obtained from primary provider, Priscilla Kim, PA, patient is a resident of [address] [facility], where patient reports she got up on the morning of admission to use the restroom when she fell onto her left side injuring her left hip. She reported moderate to severe pain in her left hip. EMS notes a deformity in her left hip. She denied head injury. She denied loss consciousness. She had no neck pain. She had no chest pain or shortness of breath. The patient had a history of lung cancer and also continues to smoke cigarettes. She has no prior cardiac history. She is on pain meds for chronic back pain. She denied syncope. She was recently hospitalized and treated for C. difficile colitis and discharged on 10/14. Patient had suffered a cat scratch wound on her right ankle about 2 weeks ago, which appeared to have become infected, so had been started on oral Augumentin by her PMD about a week ago.   Past medical history:  Past Medical History  Diagnosis Date  . lung ca dx'd 08/2010    xrt comp 10/2010  . Hypertension   . Lung cancer     LUL, LLL lung  . Osteoporosis   . Diverticulitis   . Chronic back pain     Consultants: [facility] (Dr. Charlann Boxer)  Procedures: ORIF scheduled for 10/23  Subjective: Patient complains of pain in the left abdomen and left hip area. No diarrhea. No nausea or vomiting. No cough.   Objective: Vital signs in last 24 hours: Temp:  [98.2 F (36.8 C)-99.2 F (37.3 C)] 99.2 F (37.3 C) (10/23 0414) Pulse Rate:   [85-91] 85  (10/23 0414) Resp:  [19-21] 19  (10/23 0414) BP: (134-158)/(61-72) 158/72 mmHg (10/23 0414) SpO2:  [94 %-100 %] 99 % (10/23 0414) Weight change:  Last BM Date: 10/07/12  Intake/Output from previous day: 10/22 0701 - 10/23 0700 In: 480 [P.O.:480] Out: 2300 [Urine:2300] Intake/Output this shift:    General appearance: alert, cooperative, appears stated age and no distress Head: Normocephalic, without obvious abnormality, atraumatic Resp: clear to auscultation bilaterally Cardio: regular rate and rhythm, S1, S2 normal, no murmur, click, rub or gallop GI: soft, mildly tender in left lower quadrant without rebound rigidity of guarding.; bowel sounds normal; no masses,  no organomegaly Extremities: extremities normal, atraumatic, no cyanosis or edema Pulses: 2+ and symmetric Skin: Skin color, texture, turgor normal. No rashes or lesions Neurologic: Alert. No focal deficits.  Lab Results:  [facility] 10/10/12 0525 10/08/12 0755  WBC 13.6* 14.5*  HGB 10.1* 9.6*  HCT 30.9* 29.0*  PLT 554* 500*   BMET  Basename 10/10/12 0525 10/08/12 0755 10/07/12 1148  NA 134* 136 --  K 3.5 4.1 --  CL 97 101 --  CO2 25 26 --  GLUCOSE 90 70 --  BUN 4* 7 --  CREATININE 0.38* 0.47* --  CALCIUM 8.5 8.3* --  ALT -- -- 11    Studies/Results: No results found.  Medications:  Scheduled:   . bisoprolol  5 mg Oral Daily  . budesonide-formoterol  2 puff Inhalation BID  . buPROPion  300 mg Oral Daily  . chlorpheniramine-HYDROcodone  5 mL Oral Q12H  . clindamycin (CLEOCIN) IV  900 mg Intravenous 60 min Pre-Op  . enoxaparin (LOVENOX) injection  40 mg Subcutaneous Once  . fentaNYL  50 mcg Transdermal Q72H  . fluconazole  100 mg Oral Daily  . levalbuterol  0.63 mg Nebulization Q6H  . levothyroxine  25 mcg Oral QAC breakfast  . moxifloxacin  400 mg Intravenous Q24H  . senna-docusate  2 tablet Oral QHS  . sodium chloride  3 mL Intravenous Q12H  . tiotropium  18 mcg Inhalation Daily  .  traZODone  50 mg Oral QHS  . vancomycin  125 mg Oral QID  . DISCONTD: enoxaparin (LOVENOX) injection  40 mg Subcutaneous Q24H   Continuous:   . sodium chloride 50 mL/hr at 10/10/12 0549   ZOX:[id number], acetaminophen, methocarbamol, morphine injection, ondansetron (ZOFRAN) IV, ondansetron, polyethylene glycol  Assessment/Plan:  Principal Problem:  *Hip fracture Active Problems:  C. difficile colitis  Oral thrush  HTN (hypertension)  COPD (chronic obstructive pulmonary disease)  Healthcare-associated pneumonia    Impacted left femoral neck fracture [facility] is following. Plan is for surgery today. Has been cleared by Dr. Susie Cassette. Continue perioperative beta blocker. Please see previous notes. Will likely need SNF at discharge.  Recent C. difficile colitis Improving. The patient has not had any diarrhea. Continue with oral vancomycin. White cell count has improved   Possible pneumonia Although the patient was recently hospitalized and should have been treated for healthcare associated pneumonia, given her recent episode of C. difficile colitis, held off on using broad-spectrum antibiotics. Currently on Avelox IV   History of COPD Currently stable, without any clinical symptoms. The patient will continue on her nebulizer treatments and Symbicort   Chronic back pain Has a high tolerance for narcotics. Is on fentanyl patch   Oral Candidiasis On Fluconazole. Continue till she is on Avelox.  History of non-small cell lung cancer This appears to be stable   Moderate to severe malnutrition  Has been seen by nutritionist  Code Status Full Code  DVT Prophylaxis Enoxaparin  Family Information Son at bedside  Disposition Lives in Independent living. Will likely need SNF at discharge   LOS: 3 days   [facility]  [facility] Pager [phone] 10/10/2012, 8:24 AM

## 2012-10-10 NOTE — Anesthesia Procedure Notes (Signed)
Spinal  Patient location during procedure: OR Start time: 10/10/2012 6:35 PM End time: 10/10/2012 6:45 PM Staffing Anesthesiologist: Lucille Passy F Performed by: anesthesiologist  Preanesthetic Checklist Completed: patient identified, site marked, surgical consent, pre-op evaluation, timeout performed, IV checked, risks and benefits discussed and monitors and equipment checked Spinal Block Patient position: sitting Prep: Betadine Patient monitoring: heart rate, continuous pulse ox and blood pressure Approach: midline Location: L3-4 Injection technique: single-shot Needle Needle type: Qu[place]ke  Needle gauge: 22 G Needle length: 9 cm Additional Notes Expiration date of kit checked and confirmed. Patient tolerated procedure well, without complications. Negative heme/paresthesia Lot [id number] [id number] [phone]

## 2012-10-10 NOTE — Transfer of Care (Signed)
Immediate Anesthesia Transfer of Care Note  Patient: [name]  Procedure(s) Performed: Procedure(s) (LRB) with comments: ARTHROPLASTY BIPOLAR HIP (Left)  Patient Location: PACU  Anesthesia Type: MAC and Spinal  Level of Consciousness: awake and alert   Airway & Oxygen Therapy: Patient Spontanous Breathing and Patient connected to nasal cannula oxygen  Post-op Assessment: Report given to PACU RN and Post -op Vital signs reviewed and stable  Post vital signs: Reviewed and stable  Complications: No apparent anesthesia complications

## 2012-10-10 NOTE — Progress Notes (Signed)
Patient ID: [name], female   DOB: September 03, 1932, 78 y.o.   MRN: [id number] Subjective:   Procedure(s) (LRB): ARTHROPLASTY BIPOLAR HIP (Left)    Patient reports pain as mild. A bit confused as result of pain meds. Spoke with her son, health care POA and reviewed case and necessity.  Objective:   VITALS:   Filed Vitals:   10/10/12 0414  BP: 158/72  Pulse: 85  Temp: 99.2 F (37.3 C)  Resp: 19    slight external rotation of left leg, pain with movement, not assessed   LABS  Basename 10/10/12 0525 10/08/12 0755 10/07/12 1148  HGB 10.1* 9.6* 9.7*  HCT 30.9* 29.0* 29.2*  WBC 13.6* 14.5* 20.1*  PLT 554* 500* 466*     Basename 10/10/12 0525 10/08/12 0755  NA 134* 136  K 3.5 4.1  BUN 4* 7  CREATININE 0.38* 0.47*  GLUCOSE 90 70    No results found for this basename: LABPT:2,INR:2 in the last 72 hours   Assessment/Plan:   Left femoral neck fracture   {Plan:  To [facility] today for left hip hemiarthroplasty NPO Consent on chart

## 2012-10-10 NOTE — Anesthesia Postprocedure Evaluation (Signed)
Anesthesia Post Note  Patient: [name]  Procedure(s) Performed: Procedure(s) (LRB): ARTHROPLASTY BIPOLAR HIP (Left)  Anesthesia type: Spinal  Patient location: PACU  Post pain: Pain level controlled  Post assessment: Post-op Vital signs reviewed  Last Vitals:  Filed Vitals:   10/10/12 2000  BP: 148/78  Pulse:   Temp: 36.5 C  Resp: 17    Post vital signs: Reviewed  Level of consciousness: sedated  Complications: No apparent anesthesia complications

## 2012-10-10 NOTE — Op Note (Signed)
NAME:  [name]                ACCOUNT NO.:  [id number]   MEDICAL RECORD NO.: [id number]   LOCATION:  1435                         FACILITY:  [facility]   DATE OF BIRTH:  Feb 15, 2032  PHYSICIAN:  Madlyn Frankel. Charlann Boxer, M.D.     DATE OF PROCEDURE:  10/10/2012                               OPERATIVE REPORT     PREOPERATIVE DIAGNOSIS:  Left displaced femoral neck fracture.   POSTOPERATIVE DIAGNOSIS:  Left displaced femoral neck fracture.   PROCEDURE:  Left hip hemiarthroplasty utilizing DePuy component, size 7 standard Summit Basic stem with a 45mm unipolar ball with a +0 adapter.   SURGEON:  Madlyn Frankel. Charlann Boxer, MD   ASSISTANT:  Lanney Gins, PA-C.   ANESTHESIA:  General.   SPECIMENS:  None.   DRAINS:  One medium Hemovac.   BLOOD LOSS:  About 100 cc.   COMPLICATIONS:  None.   INDICATION OF PROCEDURE:  Mrs [name] is a 78 year old female who lives independently.  She unfortunately had a fall at her independent living place.  She was admitted to the hospital after radiographs revealed a femoral neck fracture.  She was seen and evaluated and was scheduled for surgery for Fixation once her pneumonia recognized at time of admission was treated.  The necessity of surgical repair was discussed with she and her family.  Consent was obtained after reviewing risks of infection, DVT, component failure, and need for revision surgery.   PROCEDURE IN DETAIL:  The patient was brought to the operative theater. Once adequate anesthesia, preoperative antibiotics, 600 mg of Clindamycin administered, the patient was positioned into the right lateral decubitus position with the left side up.  The left lower extremity was then prepped and draped in sterile fashion.  A time-out was performed identifying the patient, planned procedure, and extremity.   A lateral incision was made off the proximal trochanter. Sharp dissection was carried down to the iliotibial band and gluteal fascia. The gluteal  fascia was then incised for posterior approach.  The short external rotators were taken down separate from the posterior capsule. An L capsulotomy was made preserving the posterior leaflet for later anatomic repair. Fracture site was identified and after removing comminuted segments of the posterior femoral neck, the femoral head was removed without difficulty and measured on the back table  using the sizing rings and determined to be 45 mm in diameter.   The proximal femur was then exposed.  Retractors placed.  I then drilled, opened the proximal femur.  Then I hand reamed once and  Irrigated the canal to try to prevent fat emboli.  I began broaching the femur with a size 1 broach up to a size 7 broach with good medial and lateral metaphyseal fit without evidence of any torsion or movement.  A trial reduction was carried out with a standard neck and a +0 adapter with a 45mm ball.  The hip reduced nicely.  The leg lengths appeared to be equal compared to the down leg.   The hip went through a range of motion without evidence of any subluxation or impingement.   Given these findings, the trial components removed.  The final 7 Summit Basic  stem was opened.  After irrigating the canal, the final stem was impacted and sat at the level where the broach was. Based on this and the trial reduction, a +0 adapter was opened and impacted in the 45mm unipolar ball onto a clean and dry trunnion.  The hip had been irrigated throughout the case and again at this point.  I re- Approximated the posterior capsule to the superior leaflet using a  #1 Vicryl,  and placed a medium Hemovac drain deep.  The remainder of the wound was closed with #1 Vicryl in the iliotibial band and gluteal fascia, a  2-0 Vicryl in the sub-Q tissue and a running 4-0 Monocryl in the skin.  The hip was cleaned, dried, and dressed sterilely using Dermabond and Aquacel dressing.  Drain site was dressed separately.  She was then  brought to recovery room, extubated in stable condition, tolerating the procedure well.  Lanney Gins, PA-C was present and utilized as Geophysicist/field seismologist for the entire case from  Preoperative positioning to management of the contralateral extremity and retractors to  General facilitation of the procedure.  He was also involved with primary wound closure.         Madlyn Frankel Charlann Boxer, M.D.

## 2012-10-10 NOTE — Anesthesia Preprocedure Evaluation (Addendum)
Anesthesia Evaluation  Patient identified by MRN, date of birth, ID band Patient awake    Reviewed: Allergy & Precautions, H&P , NPO status , Patient's Chart, lab work & pertinent test results  Airway Mallampati: II TM Distance: >3 FB Neck ROM: Full    Dental  (+) Edentulous Upper and Edentulous Lower   Pulmonary shortness of breath and with exertion, asthma , pneumonia -, COPD COPD inhaler, Current Smoker, former smoker,  Hx of lung CA + rhonchi   + decreased breath sounds      Cardiovascular hypertension, Pt. on medications and Pt. on home beta blockers Rhythm:Regular Rate:Normal     Neuro/Psych Depression Chronic low back pain negative neurological ROS     GI/Hepatic negative GI ROS, Neg liver ROS,   Endo/Other  Hypothyroidism   Renal/GU negative Renal ROS  negative genitourinary   Musculoskeletal negative musculoskeletal ROS (+)   Abdominal (+) + scaphoid   Peds negative pediatric ROS (+)  Hematology negative hematology ROS (+)   Anesthesia Other Findings   Reproductive/Obstetrics negative OB ROS                          Anesthesia Physical Anesthesia Plan  ASA: III  Anesthesia Plan: Spinal   Post-op Pain Management:    Induction:   Airway Management Planned: Simple Face Mask  Additional Equipment:   Intra-op Plan:   Post-operative Plan:   Informed Consent: I have reviewed the patients History and Physical, chart, labs and discussed the procedure including the risks, benefits and alternatives for the proposed anesthesia with the patient or authorized representative who has indicated his/her understanding and acceptance.   Dental advisory given  Plan Discussed with: CRNA  Anesthesia Plan Comments:        Anesthesia Quick Evaluation

## 2012-10-11 DIAGNOSIS — I10 Essential (primary) hypertension: Secondary | ICD-10-CM

## 2012-10-11 DIAGNOSIS — B370 Candidal stomatitis: Secondary | ICD-10-CM

## 2012-10-11 LAB — CBC
MCV: 99 fL (ref 78.0–100.0)
Platelets: 563 10*3/uL — ABNORMAL HIGH (ref 150–400)
RDW: 12.2 % (ref 11.5–15.5)
WBC: 16.7 10*3/uL — ABNORMAL HIGH (ref 4.0–10.5)

## 2012-10-11 LAB — BASIC METABOLIC PANEL
CO2: 26 mEq/L (ref 19–32)
Calcium: 8.5 mg/dL (ref 8.4–10.5)
Creatinine, Ser: 0.46 mg/dL — ABNORMAL LOW (ref 0.50–1.10)
GFR calc Af Amer: >90 mL/min (ref >90)

## 2012-10-11 MED ORDER — HYDROCODONE-ACETAMINOPHEN 5-325 MG PO TABS: 1.0000 | ORAL_TABLET | ORAL | Status: DC | PRN

## 2012-10-11 MED ORDER — ENOXAPARIN SODIUM 40 MG/0.4ML ~~LOC~~ SOLN: 40.0000 mg | SUBCUTANEOUS | Status: DC

## 2012-10-11 MED ORDER — FERROUS SULFATE 325 (65 FE) MG PO TABS: 325.0000 mg | ORAL_TABLET | Freq: Three times a day (TID) | ORAL | Status: DC

## 2012-10-11 MED ORDER — POLYETHYLENE GLYCOL 3350 17 G PO PACK: 17.0000 g | PACK | Freq: Every day | ORAL | Status: DC | PRN

## 2012-10-11 MED ORDER — MOXIFLOXACIN HCL 400 MG PO TABS
400.0000 mg | ORAL_TABLET | Freq: Every day | ORAL | Status: DC
  Administered 2012-10-11: 400 mg via ORAL
  Filled 2012-10-11 (×2): qty 1

## 2012-10-11 MED ORDER — POTASSIUM CHLORIDE CRYS ER 20 MEQ PO TBCR
40.0000 meq | EXTENDED_RELEASE_TABLET | Freq: Once | ORAL | Status: AC
  Administered 2012-10-11: 40 meq via ORAL
  Filled 2012-10-11: qty 2

## 2012-10-11 MED ORDER — VANCOMYCIN 50 MG/ML ORAL SOLUTION
125.0000 mg | Freq: Four times a day (QID) | ORAL | Status: DC
  Administered 2012-10-11 – 2012-10-12 (×4): 125 mg via ORAL
  Filled 2012-10-11 (×9): qty 2.5

## 2012-10-11 MED ORDER — METHOCARBAMOL 500 MG PO TABS: 500.0000 mg | ORAL_TABLET | Freq: Three times a day (TID) | ORAL | Status: DC | PRN

## 2012-10-11 NOTE — Progress Notes (Addendum)
Physical Therapy Treatment Patient Details Name: [name] MRN: [id number] DOB: [birth year] Today's Date: 10/11/2012 Time: 7829-5621 PT Time Calculation (min): 34 min  PT Assessment / Plan / Recommendation Comments on Treatment Session   Pt with recent hospital adm due to C-diff, and now adm with left hip fx s/p left hip hemiarthroplasty and significant deconditioning. Pt will benefit from PT to maximize independence for next venue. Pt and son are planning for STSNF, PT in agreemetn with this plan.    Follow Up Recommendations  Post acute inpatient     Does the patient have the potential to tolerate intense rehabilitation  No, Recommend SNF  Barriers to Discharge        Equipment Recommendations  None recommended by PT    Recommendations for Other Services    Frequency Min 4X/week   Plan      Precautions / Restrictions Precautions Precautions: Posterior Hip;Fall Restrictions Weight Bearing Restrictions: Yes LLE Weight Bearing: Weight bearing as tolerated   Pertinent Vitals/Pain 3/4 DOE with transfers    Mobility  Bed Mobility Bed Mobility: Supine to Sit Supine to Sit: 1: +2 Total assist;HOB elevated Supine to Sit: Patient Percentage: 20% Details for Bed Mobility Assistance: cue sfor technique and hip precautions Transfers Transfers: Sit to Stand;Stand to Sit;Stand Pivot Transfers Sit to Stand: 1: +2 Total assist;From bed;From chair/3-in-1 Sit to Stand: Patient Percentage: 30% Stand to Sit: 1: +2 Total assist;To chair/3-in-1 Stand to Sit: Patient Percentage: 30% Stand Pivot Transfers: 1: +2 Total assist Stand Pivot Transfers: Patient Percentage: 30% Details for Transfer Assistance: +2 for safety, balance, RW use; pt fatigues rapidly;  all transfers--x 2 Ambulation/Gait Ambulation/Gait Assistance: Other (comment) (unable due to fatigue and pain)    Exercises     PT Diagnosis: Difficulty walking;Generalized weakness  PT Problem List: Decreased range of  motion;Decreased activity tolerance;Decreased balance;Decreased mobility;Decreased knowledge of use of DME;Decreased strength;Decreased knowledge of precautions;Pain PT Treatment Interventions: DME instruction;Gait training;Functional mobility training;Therapeutic activities;Therapeutic exercise;Balance training;Patient/family education   PT Goals Acute Rehab PT Goals PT Goal Formulation: With patient Time For Goal Achievement: 10/25/12 Potential to Achieve Goals: Fair Pt will go Supine/Side to Sit: with min assist PT Goal: Supine/Side to Sit - Progress: Goal set today Pt will go Sit to Stand: with min assist PT Goal: Sit to Stand - Progress: Goal set today Pt will go Stand to Sit: with min assist PT Goal: Stand to Sit - Progress: Goal set today Pt will Ambulate: with min assist;1 - 15 feet;with rolling walker PT Goal: Ambulate - Progress: Goal set today Pt will Perform Home Exercise Program: with min assist PT Goal: Perform Home Exercise Program - Progress: Goal set today  Visit Information  Last PT Received On: 10/11/12 Assistance Needed: +2    Subjective Data  Subjective: It does hurt Patient Stated Goal: rehab at [facility]  Overall Cognitive Status: Appears within functional limits for tasks assessed/performed Arousal/Alertness: Awake/alert Orientation Level: Appears intact for tasks assessed Behavior During Session: [facility] for tasks performed    Balance  Static Standing Balance Static Standing - Balance Support: Bilateral upper extremity supported;During functional activity Static Standing - Level of Assistance: 2: Max assist;1: +1 Total assist  End of Session PT - End of Session Equipment Utilized During Treatment: Gait belt Patient left: in chair;with call bell/phone within reach Nurse Communication: Mobility status   GP     [facility] 10/11/2012, 12:50 PM

## 2012-10-11 NOTE — Progress Notes (Signed)
Patient unable to void after multiple attempts. Bladder scan revealed 400cc in bladder. MD was notified. Plans are to put foley in. Will continue to monitor patient. Setzer, Don Broach

## 2012-10-11 NOTE — Progress Notes (Signed)
Spoke with Dr. Rito Ehrlich who anticipates d/c tomorrow- son aware and plans to transport via car- [employer] is also aware and prepared for probable tx tomorrow.  Reece Levy, MSW, Theresia Majors [phone]

## 2012-10-11 NOTE — Progress Notes (Signed)
PCP: [name], MD  Brief HPI: This is a 78 year old female, with known history of HTN, depression, COPD, hypothyroidism, OA, s/p left knee replacement, s/p left artificial shoulder, osteoporosis/vertebral fractures, s/p vertebroplasty x 2, chronic back pain on long-term opioid medication, diverticulosis, non-small cell carcinoma of LUL/LLL s/p XRT,, s/p right cataract surgery 09/24/12, presenting after a fall. According to patient, and office notes obtained from primary provider, [name], PA, patient is a resident of [address] [facility], where patient reports she got up on the morning of admission to use the restroom when she fell onto her left side injuring her left hip. She reported moderate to severe pain in her left hip. EMS notes a deformity in her left hip. She denied head injury. She denied loss consciousness. She had no neck pain. She had no chest pain or shortness of breath. The patient had a history of lung cancer and also continues to smoke cigarettes. She has no prior cardiac history. She is on pain meds for chronic back pain. She denied syncope. She was recently hospitalized and treated for C. difficile colitis and discharged on 10/14. Patient had suffered a cat scratch wound on her right ankle about 2 weeks ago, which appeared to have become infected, so had been started on oral Augumentin by her PMD about a week ago.   Past medical history:  Past Medical History  Diagnosis Date  . lung ca dx'd 08/2010    xrt comp 10/2010  . Hypertension   . Lung cancer     LUL, LLL lung  . Osteoporosis   . Diverticulitis   . Chronic back pain     Consultants: [facility] (Dr. Charlann Boxer)  Procedures: ORIF 10/23  Subjective: Patient complains of pain but feels well otherwise. No shortness of breath.  Objective: Vital signs in last 24 hours: Temp:  [97.4 F (36.3 C)-98.8 F (37.1 C)] 98.7 F (37.1 C) (10/24 0517) Pulse Rate:  [69-83] 73  (10/24 0517) Resp:  [14-20] 16  (10/24  0517) BP: (148-168)/(64-90) 152/64 mmHg (10/24 0517) SpO2:  [3 %-100 %] 100 % (10/24 0517) Weight change:  Last BM Date: 10/10/12  Intake/Output from previous day: 10/23 0701 - 10/24 0700 In: 6157.5 [I.V.:5657.5; IV Piggyback:500] Out: 2712 [Urine:2660; Stool:2; Blood:50] Intake/Output this shift: Total I/O In: 120 [P.O.:120] Out: -   General appearance: alert, cooperative, appears stated age and no distress Head: Normocephalic, without obvious abnormality, atraumatic Resp: clear to auscultation bilaterally Cardio: regular rate and rhythm, S1, S2 normal, no murmur, click, rub or gallop GI: soft, mildly tender in left lower quadrant without rebound rigidity of guarding.; bowel sounds normal; no masses,  no organomegaly Extremities: extremities normal, atraumatic, no cyanosis or edema Neurologic: Alert. No focal deficits.  Lab Results:  Basename 10/11/12 0500 10/10/12 0525  WBC 16.7* 13.6*  HGB 10.0* 10.1*  HCT 30.3* 30.9*  PLT 563* 554*   BMET  Basename 10/11/12 0500 10/10/12 0525  NA 136 134*  K 3.2* 3.5  CL 97 97  CO2 26 25  GLUCOSE 102* 90  BUN 7 4*  CREATININE 0.46* 0.38*  CALCIUM 8.5 8.5  ALT -- --    Studies/Results: Dg Pelvis Portable  10/10/2012  *RADIOLOGY REPORT*  Clinical Data: Postop total left hip.  PORTABLE PELVIS,PORTABLE LEFT HIP - 1 VIEW  Comparison: 10/07/2012  Findings: Left total hip replacement appears in satisfactory position without complication noted.  IMPRESSION: Left total hip replacement appears in satisfactory position without complication noted.  Original Report Authenticated By: Fuller Canada, M.D.    Dg Hip Portable 1 View Left  10/10/2012  *RADIOLOGY REPORT*  Clinical Data: Postop total left hip.  PORTABLE PELVIS,PORTABLE LEFT HIP - 1 VIEW  Comparison: 10/07/2012  Findings: Left total hip replacement appears in satisfactory position without complication noted.  IMPRESSION: Left total hip replacement appears in satisfactory position  without complication noted.   Original Report Authenticated By: Fuller Canada, M.D.     Medications:  Scheduled:    . bisoprolol  5 mg Oral Daily  . budesonide-formoterol  2 puff Inhalation BID  . buPROPion  300 mg Oral Daily  . chlorpheniramine-HYDROcodone  5 mL Oral Q12H  . clindamycin (CLEOCIN) IV  600 mg Intravenous Q6H  . clindamycin (CLEOCIN) IV  900 mg Intravenous 60 min Pre-Op  . enoxaparin (LOVENOX) injection  40 mg Subcutaneous Q24H  . fentaNYL  50 mcg Transdermal Q72H  . ferrous sulfate  325 mg Oral TID PC  . fluconazole  100 mg Oral Daily  . levalbuterol  0.63 mg Nebulization Q6H  . levothyroxine  25 mcg Oral QAC breakfast  . moxifloxacin  400 mg Intravenous Q24H  . senna-docusate  2 tablet Oral QHS  . sodium chloride  3 mL Intravenous Q12H  . tiotropium  18 mcg Inhalation Daily  . traZODone  50 mg Oral QHS  . DISCONTD: vancomycin  125 mg Oral QID   Continuous:    . sodium chloride 50 mL/hr at 10/11/12 0528  . DISCONTD: lactated ringers 125 mL/hr at 10/10/12 2030   JYN:WGNFAOZHYQMVH, acetaminophen, HYDROcodone-acetaminophen, methocarbamol, ondansetron (ZOFRAN) IV, ondansetron, polyethylene glycol, DISCONTD: 0.9 % irrigation (POUR BTL), DISCONTD: fentaNYL, DISCONTD:  morphine injection, DISCONTD: promethazine  Assessment/Plan:  Principal Problem:  *Hip fracture Active Problems:  C. difficile colitis  Oral thrush  HTN (hypertension)  COPD (chronic obstructive pulmonary disease)  Healthcare-associated pneumonia    Impacted left femoral neck fracture Status post ORIF. Doing well post operatively. Will likely need SNF at discharge.  Recent C. difficile colitis Improving. The patient has not had any diarrhea. Continue with oral vancomycin while she is on Avelox. White cell count has increased, though she remains afebrile. Could be from stress demargination.  Possible pneumonia Although the patient was recently hospitalized and should have been treated for  healthcare associated pneumonia, given her recent episode of C. difficile colitis, held off on using broad-spectrum antibiotics. Currently on Avelox IV. Change to oral. Increase in WBC noted. See above.  History of COPD Currently stable, without any clinical symptoms. The patient will continue on her nebulizer treatments and Symbicort   Chronic back pain Has a high tolerance for narcotics. Is on fentanyl patch   Oral Candidiasis On Fluconazole. Continue till she is on Avelox.  History of non-small cell lung cancer This appears to be stable   Moderate to severe malnutrition  Has been seen by nutritionist  Code Status Full Code  DVT Prophylaxis Enoxaparin  Family Information Son at bedside  Disposition SNF at discharge, possibly 10/25.   LOS: 4 days   [facility]  [facility] Pager [phone] 10/11/2012, 10:52 AM

## 2012-10-11 NOTE — Progress Notes (Signed)
   Subjective: 1 Day Post-Op Procedure(s) (LRB): ARTHROPLASTY BIPOLAR HIP (Left)   Patient reports pain as mild, pain well controlled. No events throughout the night. Son states that her pain is much better since surgery.  He also states that she is much better after not having morphine, no more hallucinations. She is sleeping well when seen, which the sons states she also hasn't done well since being in the hospital until last night.  Objective:   VITALS:   Filed Vitals:   10/11/12 0517  BP: 152/64  Pulse: 73  Temp: 98.7 F (37.1 C)  Resp: 16    Neurovascular intact Dorsiflexion/Plantar flexion intact Incision: dressing C/D/I No cellulitis present Compartment soft  LABS  Basename 10/11/12 0500 10/10/12 0525  HGB 10.0* 10.1*  HCT 30.3* 30.9*  WBC 16.7* 13.6*  PLT 563* 554*     Basename 10/11/12 0500 10/10/12 0525  NA 136 134*  K 3.2* 3.5  BUN 7 4*  CREATININE 0.46* 0.38*  GLUCOSE 102* 90     Assessment/Plan: 1 Day Post-Op Procedure(s) (LRB): ARTHROPLASTY BIPOLAR HIP (Left) Foley cath d/c'ed Up with therapy Discharge to SNF when ready, son has looked in [facility] she is stable She is to be WBAT on the left leg We will keep her on Lovenox for 2 weeks to prevent blood clots. Keep her on her current pain medications of Norco. Will eventually follow up in 2 weeks at [facility].  Follow-up Information    Follow up with OLIN,Naesha Buckalew D in 2 weeks.   Contact information:   [facility] [address] [place] [address] [phone]            Anastasio Auerbach. Reshard Guillet   PAC  10/11/2012, 10:29 AM

## 2012-10-12 ENCOUNTER — Encounter (HOSPITAL_COMMUNITY): Payer: Self-pay | Admitting: Orthopedic Surgery

## 2012-10-12 LAB — BASIC METABOLIC PANEL
CO2: 28 mEq/L (ref 19–32)
Chloride: 100 mEq/L (ref 96–112)
Creatinine, Ser: 0.48 mg/dL — ABNORMAL LOW (ref 0.50–1.10)
Glucose, Bld: 81 mg/dL (ref 70–99)

## 2012-10-12 LAB — CBC
HCT: 30.9 % — ABNORMAL LOW (ref 36.0–46.0)
MCH: 32.9 pg (ref 26.0–34.0)
MCV: 99.7 fL (ref 78.0–100.0)
RDW: 12.3 % (ref 11.5–15.5)
WBC: 13.4 10*3/uL — ABNORMAL HIGH (ref 4.0–10.5)

## 2012-10-12 MED ORDER — LEVALBUTEROL HCL 0.63 MG/3ML IN NEBU
0.6300 mg | INHALATION_SOLUTION | Freq: Three times a day (TID) | RESPIRATORY_TRACT | Status: DC
  Administered 2012-10-12: 0.63 mg via RESPIRATORY_TRACT
  Filled 2012-10-12 (×4): qty 3

## 2012-10-12 MED ORDER — MOXIFLOXACIN HCL 400 MG PO TABS: 400.0000 mg | ORAL_TABLET | Freq: Every day | ORAL | Status: DC

## 2012-10-12 MED ORDER — ALBUTEROL SULFATE HFA 108 (90 BASE) MCG/ACT IN AERS: 2.0000 | INHALATION_SPRAY | Freq: Four times a day (QID) | RESPIRATORY_TRACT | Status: DC | PRN

## 2012-10-12 MED ORDER — VANCOMYCIN 50 MG/ML ORAL SOLUTION: 125.0000 mg | Freq: Four times a day (QID) | ORAL | Status: DC

## 2012-10-12 MED ORDER — FLUCONAZOLE 100 MG PO TABS: 100.0000 mg | ORAL_TABLET | Freq: Every day | ORAL | Status: DC

## 2012-10-12 NOTE — Progress Notes (Signed)
Physical Therapy Treatment Patient Details Name: [name] MRN: [id number] DOB: [birth year] Today's Date: 10/12/2012 Time: 5784-6962 PT Time Calculation (min): 28 min  PT Assessment / Plan / Recommendation Comments on Treatment Session  POD # 2 L Bipolar Arthroplasty 2nd fall/fx.  WBAT.  Assisted pt OOB to amb then positioned in recliner.  Pt unaware of her 3 hip percautions, so re educated and demonstarted however 2 min later, could not recall. Pt nervous/anxious.  Required increased time and increased emotional support.  Pt plans to D/C to SNF today.    Follow Up Recommendations   (Skilled Nursing)     Does the patient have the potential to tolerate intense rehabilitation     Barriers to Discharge        Equipment Recommendations       Recommendations for Other Services    Frequency Min 4X/week   Plan Discharge plan remains appropriate    Precautions / Restrictions Precautions Precautions: Posterior Hip;Fall Precaution Comments: Pt could not recall any of her hip percautions so re educated and demonstrated Restrictions Weight Bearing Restrictions: No LLE Weight Bearing: Weight bearing as tolerated Other Position/Activity Restrictions: Instructed pt on WBAT    Pertinent Vitals/Pain C/o a lot L hip pain with amb Pre medicated w/ Ativan    Mobility  Bed Mobility Bed Mobility: Supine to Sit Supine to Sit: 1: +2 Total assist Supine to Sit: Patient Percentage: 30% Details for Bed Mobility Assistance: increased time and caution to THP (esp Internal rotation) total assist + 2 with 75% VC's on proper tech.  Transfers Transfers: Sit to Stand;Stand to Sit Sit to Stand: 1: +2 Total assist;From bed Sit to Stand: Patient Percentage: 50% Stand to Sit: 1: +2 Total assist;To chair/3-in-1 Stand to Sit: Patient Percentage: 50% Details for Transfer Assistance: 75% VC's on proper technique with hand placement and to decrease anxiety.  Ambulation/Gait Ambulation/Gait  Assistance: 1: +2 Total assist Ambulation/Gait: Patient Percentage: 50% Ambulation Distance (Feet): 12 Feet Assistive device: Rolling walker Ambulation/Gait Assistance Details: 75% VC's to decrease anxiety/fear and proper sequencing. Very limited amb distance. Gait Pattern: Step-to pattern;Decreased stance time - left;Narrow base of support;Decreased stride length Gait velocity: decreased General Gait Details: Very nervous with max anxiety and 8/10 L hip pain despite pre medication     PT Goals    progressing    Visit Information  Last PT Received On: 10/12/12                   End of Session PT - End of Session Equipment Utilized During Treatment: Gait belt Activity Tolerance: Patient limited by fatigue;Patient limited by pain Patient left: in chair;with call bell/phone within reach Nurse Communication: Mobility status  Felecia Shelling  PTA [facility]  Acute  Rehab Pager     [phone]

## 2012-10-12 NOTE — Progress Notes (Signed)
Patient discharge to [facility] at bedside. report given to Faith, Charity fundraiser. Patient will be discharge  With foley catheter per Dr. Rito Ehrlich.Surgical site dressing dry and intact no s/s of infection noted upon discharge. PIV removed no swelling or infiltration noted.

## 2012-10-12 NOTE — Discharge Summary (Signed)
Physician Discharge Summary  Patient ID: [name] MRN: [id number] DOB/AGE: Jun 27, 1932 78 y.o.  Admit date: 10/07/2012 Discharge date: 10/12/2012  PCP: [name], MD  DISCHARGE DIAGNOSES:  Principal Problem:  *Hip fracture Active Problems:  C. difficile colitis  Oral thrush  HTN (hypertension)  COPD (chronic obstructive pulmonary disease)  Healthcare-associated pneumonia   RECOMMENDATIONS SNF: 1. Voiding trial in 1-2 days 2. DVT prophylaxis with Lovenox for 2 weeks.  INITIAL HISTORY: This is a 78 year old female, with known history of HTN, depression, COPD, hypothyroidism, OA, s/p left knee replacement, s/p left artificial shoulder, osteoporosis/vertebral fractures, s/p vertebroplasty x 2, chronic back pain on long-term opioid medication, diverticulosis, non-small cell carcinoma of LUL/LLL s/p XRT,, s/p right cataract surgery 09/24/12, presenting after a fall. According to patient, and office notes obtained from primary provider, Priscilla Kim, PA, patient is a resident of [address] [facility], where patient reports she got up on the morning of admission to use the restroom when she fell onto her left side injuring her left hip. She reported moderate to severe pain in her left hip. EMS notes a deformity in her left hip. She denied head injury. She denied loss consciousness. She had no neck pain. She had no chest pain or shortness of breath. The patient had a history of lung cancer and also continues to smoke cigarettes. She has no prior cardiac history. She is on pain meds for chronic back pain. She denied syncope. She was recently hospitalized and treated for C. difficile colitis and discharged on 10/14. Patient had suffered a cat scratch wound on her right ankle about 2 weeks ago, which appeared to have become infected, so had been started on oral Augumentin by her PMD about a week prior to admission.    HOSPITAL COURSE:   Impacted left femoral neck fracture  She  is status post ORIF. She did well postoperatively. Patient has been cleared by orthopedic service. She did have some problems urinating, yesterday and had some urinary retention. So, a Foley had to be reinserted. I would recommend that the foley be kept in place for at least one to 2 days more till she is more active. At that point foley can be discontinued, and a voiding trial can be given.  Recent C. difficile colitis  Patient is without any diarrhea. At this time her C. difficile colitis seems to have improved. Oral vancomycin will be continued as long as the patient is on oral antibiotics for her pneumonia.  Possible pneumonia  Although the patient was recently hospitalized and should have been treated for healthcare associated pneumonia, given her recent episode of C. difficile colitis, held off on using broad-spectrum antibiotics. She was transitioned to oral Avelox. Patient has a mild cough once in a while, but otherwise remains mostly asymptomatic. She did have an elevated white cell count yesterday, but it is trending down as of today. She remains afebrile.  History of COPD  Currently stable, without any clinical symptoms. The patient will continue on her nebulizer treatments and Symbicort   Chronic back pain  Has a high tolerance for narcotics. Is on fentanyl patch.   Oral Candidiasis  On Fluconazole. Continue till she is on Avelox.  History of non-small cell lung cancer  This appears to be stable   Moderate to severe malnutrition  Has been seen by nutritionist. Use use nutritional supplements as needed.  DVT Prophylaxis  Continue with Enoxaparin for 2 weeks.  All of the above was discussed  with the son. Patient remained stable. She denies any complaints except for pain in the left lower extremity. She was able to to get out of bed with assistance yesterday. She will need continued rehabilitation at the skilled nursing facility.   IMAGING STUDIES Dg Chest 1 View  10/07/2012   *RADIOLOGY REPORT*  Clinical Data: Hip pain  CHEST - 1 VIEW  Comparison: 09/12/2012  Findings: Heart size is normal.  There is no pleural effusion or edema.  Left base opacities appear new from previous exam and is suspicious for pneumonia.  Stable scar like densities identified within the left lower lobe.  IMPRESSION:  1.  Left base opacity appears new from previous exam.  Suspicious for pneumonia.  Recommend follow-up imaging with dedicated PA and lateral chest radiograph.   Original Report Authenticated By: Rosealee Albee, M.D.    Dg Hip Complete Left  10/07/2012  *RADIOLOGY REPORT*  Clinical Data: Hip pain  LEFT HIP - COMPLETE 2+ VIEW  Comparison: None  Findings: There is a slightly impacted left femoral neck fracture. Mild varus angulation of the distal fracture fragments.  No subluxation.  IMPRESSION:  1.  Impacted left femoral neck fracture.   Original Report Authenticated By: Rosealee Albee, M.D.    Ct Chest Wo Contrast  09/12/2012  *RADIOLOGY REPORT*  Clinical Data: Left rib pain.  History of lung cancer.  CT CHEST WITHOUT CONTRAST  Technique:  Multidetector CT imaging of the chest was performed following the standard protocol without IV contrast.  Comparison: 02/01/2012.  Findings: Left thyroid nodule measures approximately 2.4 x 2.4 cm, as before.  No pathologically enlarged mediastinal or axillary lymph nodes.  Hilar regions are difficult to definitively evaluate without IV contrast.  Lipomatous hypertrophy of the interatrial septum.  Coronary artery calcification.  Dense mitral annulus calcification.  Heart size normal.  No pericardial effusion.  Biapical pleural parenchymal scarring, right greater than left. Emphysema.  Postoperative changes in the left upper lobe.  A nodular component is seen at the 7 o'clock position (image 18), measuring 5 mm (previously 5 x 4 mm).  Diffuse peribronchovascular nodularity and/or subpleural reticulation, as before. Postoperative changes in the left lower  lobe as well, stable. Probable small subpleural lymph node along the right major fissure. No pleural fluid.  Airway is unremarkable.  Incidental imaging of the upper abdomen shows a 2.8 x 2.0 cm low attenuation lesion in the right adrenal gland, stable.  Low attenuation lesion in the left adrenal gland is also likely stable, measuring 2.0 x 1.1 cm.  Streaky densities in the peripheral aspect of the liver are seen deep to an anterior right rib, favoring artifact.  Stones are seen in the kidneys bilaterally.  No worrisome lytic or sclerotic lesions. Stable lower thoracic compression fracture, flanked by upper and lower vertebroplasties.  IMPRESSION:  1.  Postoperative changes in the left upper lobe with a small nodular component, as on prior exams.  Continued attention on follow-up exams is warranted. 2.  Postoperative changes in the left lower lobe, stable. 3.  Diffuse peribronchovascular nodularity and/or subpleural reticulation, as before, indicating mild fibrosis. 4.  Bilateral adrenal adenomas. 5.  Bilateral renal stones.   Original Report Authenticated By: Reyes Ivan, M.D.    Dg Pelvis Portable  10/10/2012  *RADIOLOGY REPORT*  Clinical Data: Postop total left hip.  PORTABLE PELVIS,PORTABLE LEFT HIP - 1 VIEW  Comparison: 10/07/2012  Findings: Left total hip replacement appears in satisfactory position without complication noted.  IMPRESSION: Left total hip  replacement appears in satisfactory position without complication noted.   Original Report Authenticated By: Fuller Canada, M.D.    Dg Hip Portable 1 View Left  10/10/2012  *RADIOLOGY REPORT*  Clinical Data: Postop total left hip.  PORTABLE PELVIS,PORTABLE LEFT HIP - 1 VIEW  Comparison: 10/07/2012  Findings: Left total hip replacement appears in satisfactory position without complication noted.  IMPRESSION: Left total hip replacement appears in satisfactory position without complication noted.   Original Report Authenticated By: Fuller Canada, M.D.     DISCHARGE EXAMINATION: Blood pressure 134/58, pulse 76, temperature 98.2 F (36.8 C), temperature source Oral, resp. rate 16, height 5\' 2"  (1.575 m), weight 39.917 kg (88 lb), SpO2 98.00%. General appearance: alert, cooperative, appears stated age and no distress Head: Normocephalic, without obvious abnormality, atraumatic Resp: clear to auscultation bilaterally Cardio: regular rate and rhythm, S1, S2 normal, no murmur, click, rub or gallop GI: soft, non-tender; bowel sounds normal; no masses,  no organomegaly Extremities: extremities normal, atraumatic, no cyanosis or edema Neurologic: Grossly normal  DISPOSITION: SNF  Discharge Orders    Future Orders Please Complete By Expires   Diet - low sodium heart healthy      Diet - low sodium heart healthy      Call MD / Call 911      Comments:   If you experience chest pain or shortness of breath, CALL 911 and be transported to the hospital emergency room.  If you develope a fever above 101 F, pus (white drainage) or increased drainage or redness at the wound, or calf pain, call your surgeon's office.   Discharge instructions      Comments:   Maintain surgical dressing for 10-14 days, then replace with gauze and tape. Keep the area dry and clean until follow up. Follow up in 2 weeks at [facility]. Call with any questions or concerns.   Constipation Prevention      Comments:   Drink plenty of fluids.  Prune juice may be helpful.  You may use a stool softener, such as Colace (over the counter) 100 mg twice a day.  Use MiraLax (over the counter) for constipation as needed.   Increase activity slowly as tolerated      Comments:   Weight bearing as tolerated on the left leg. Increase activity as tolerated and guided by PT.   Change dressing      Comments:   Maintain surgical dressing for 10-14 days, then replace with 4x4 guaze and tape. Keep the area dry and clean.   TED hose      Comments:   Use stockings (TED  hose) for 2 weeks on both leg(s).  You may remove them at night for sleeping.   Increase activity slowly        Current Discharge Medication List    START taking these medications   Details  albuterol (PROVENTIL HFA;VENTOLIN HFA) 108 (90 BASE) MCG/ACT inhaler Inhale 2 puffs into the lungs every 6 (six) hours as needed for wheezing. Qty: 1 Inhaler, Refills: 2    enoxaparin (LOVENOX) 40 MG/0.4ML injection Inject 0.4 mLs (40 mg total) into the skin daily. Qty: 12 Syringe, Refills: 0    ferrous sulfate 325 (65 FE) MG tablet Take 1 tablet (325 mg total) by mouth 3 (three) times daily after meals.    methocarbamol (ROBAXIN) 500 MG tablet Take 1 tablet (500 mg total) by mouth every 8 (eight) hours as needed (muscle spasms). Qty: 50 tablet, Refills: 0  moxifloxacin (AVELOX) 400 MG tablet Take 1 tablet (400 mg total) by mouth daily at 8 pm. For 5 days    polyethylene glycol (MIRALAX / GLYCOLAX) packet Take 17 g by mouth daily as needed. Qty: 14 each      CONTINUE these medications which have CHANGED   Details  fluconazole (DIFLUCAN) 100 MG tablet Take 1 tablet (100 mg total) by mouth daily. Take for 5 days Qty: 4 tablet, Refills: 0    HYDROcodone-acetaminophen (NORCO/VICODIN) 5-325 MG per tablet Take 1-2 tablets by mouth every 4 (four) hours as needed for pain. Qty: 120 tablet, Refills: 0    vancomycin (VANCOCIN) 50 mg/mL oral solution Take 2.5 mLs (125 mg total) by mouth 4 (four) times daily. For 7 days      CONTINUE these medications which have NOT CHANGED   Details  amLODipine (NORVASC) 10 MG tablet Take 10 mg by mouth daily.    bisoprolol (ZEBETA) 5 MG tablet Take 7.5 mg by mouth daily. Pt takes 1.5 tablet    budesonide-formoterol (SYMBICORT) 160-4.5 MCG/ACT inhaler Inhale 2 puffs into the lungs 2 (two) times daily.    buPROPion (WELLBUTRIN XL) 300 MG 24 hr tablet Take 300 mg by mouth daily.    fentaNYL (DURAGESIC - DOSED MCG/HR) 50 MCG/HR Place 1 patch onto the skin every  3 (three) days.    fexofenadine (ALLEGRA) 180 MG tablet Take 180 mg by mouth daily.    levothyroxine (SYNTHROID, LEVOTHROID) 25 MCG tablet Take 25 mcg by mouth daily.    psyllium (METAMUCIL) 58.6 % packet Take 1 packet by mouth daily.    tiotropium (SPIRIVA) 18 MCG inhalation capsule Place 1 capsule (18 mcg total) into inhaler and inhale daily. Qty: 30 capsule, Refills: 0    traZODone (DESYREL) 50 MG tablet Take 50 mg by mouth at bedtime.    ondansetron (ZOFRAN) 4 MG tablet Take 1 tablet (4 mg total) by mouth every 6 (six) hours as needed for nausea. Qty: 20 tablet, Refills: 0      STOP taking these medications     hydrochlorothiazide (MICROZIDE) 12.5 MG capsule      oxyCODONE (OXY IR/ROXICODONE) 5 MG immediate release tablet      acetaminophen (TYLENOL) 325 MG tablet        Follow-up Information    Follow up with Shelda Pal, MD. Schedule an appointment as soon as possible for a visit in 2 weeks.   Contact information:   [facility] [address] [place] [address] [phone]       Follow up with FRIED, ROBERT L, MD. Schedule an appointment as soon as possible for a visit in 2 weeks.   Contact information:   [address] [place] [address] [phone]          TOTAL DISCHARGE TIME: 35 mins  [facility] [place] [facility]  [facility] Pager [phone]  10/12/2012, 8:54 AM

## 2012-10-12 NOTE — Clinical Social Work Placement (Signed)
     Clinical Social Work Department CLINICAL SOCIAL WORK PLACEMENT NOTE 10/12/2012  Patient:  [name], [name]  Account Number:  [id number] Admit date:  10/07/2012  Clinical Social Worker:  Orpah Greek  Date/time:  10/08/2012 03:47 PM  Clinical Social Work is seeking post-discharge placement for this patient at the following level of care:   SKILLED NURSING   (*CSW will update this form in Epic as items are completed)   10/08/2012  Patient/family provided with [facility] Department of Clinical Social Works list of facilities offering this level of care within the geographic area requested by the patient (or if unable, by the patients family).  10/08/2012  Patient/family informed of their freedom to choose among providers that offer the needed level of care, that participate in Medicare, Medicaid or managed care program needed by the patient, have an available bed and are willing to accept the patient.  10/08/2012  Patient/family informed of [facility] ownership interest in [facility], as well as of the fact that they are under no obligation to receive care at this facility.  PASARR submitted to EDS on 10/08/2012 PASARR number received from EDS on   FL2 transmitted to all facilities in geographic area requested by pt/family on  10/08/2012 FL2 transmitted to all facilities within larger geographic area on   Patient informed that his/her managed care company has contracts with or will negotiate with  certain facilities, including the following:     Patient/family informed of bed offers received:  10/10/2012 Patient chooses bed at [facility] Physician recommends and patient chooses bed at    Patient to be transferred to [facility] on  10/12/2012 Patient to be transferred to facility by EMS PER MD REQUEST  The following physician request were entered in Epic:   Additional Comments:

## 2012-10-12 NOTE — Progress Notes (Signed)
OT Screen Order received, chart reviewed. Plan is for pt to d/c to snf today. Will sign off and defer OT eval to that venue.  Garrel Ridgel, OTR/L  Pager [phone] 10/12/2012

## 2012-11-30 ENCOUNTER — Encounter: Payer: Self-pay | Admitting: Internal Medicine

## 2012-11-30 ENCOUNTER — Ambulatory Visit (INDEPENDENT_AMBULATORY_CARE_PROVIDER_SITE_OTHER): Payer: Medicare Other | Admitting: Internal Medicine

## 2012-11-30 VITALS — BP 124/66 | HR 76 | Temp 97.9°F | Ht 60.0 in | Wt 79.0 lb

## 2012-11-30 DIAGNOSIS — J449 Chronic obstructive pulmonary disease, unspecified: Secondary | ICD-10-CM

## 2012-11-30 MED ORDER — PREDNISONE (PAK) 10 MG PO TABS: ORAL_TABLET | ORAL | Status: DC

## 2012-11-30 NOTE — Patient Instructions (Addendum)
Pepcid 20 mg after breakfast and at bedtime  Prednisone 10 mg take  4 each am x 2 days,   2 each am x 2 days,  1 each am x2days and stop   Ok to leave off symbicort if breathing better. Only use albuterol if can't breath sitting still   GERD (REFLUX)  is an extremely common cause of respiratory symptoms, many times with no significant heartburn at all.    It can be treated with medication, but also with lifestyle changes including avoidance of late meals, excessive alcohol, smoking cessation, and avoid fatty foods, chocolate, peppermint, colas, red wine, and acidic juices such as orange juice.  NO MINT OR MENTHOL PRODUCTS SO NO COUGH DROPS  USE SUGARLESS CANDY INSTEAD (jolley ranchers or Stover's)  NO OIL BASED VITAMINS - use powdered substitutes.

## 2012-11-30 NOTE — Progress Notes (Signed)
Subjective:     Patient ID: [name], female   DOB: [birth year]  MRN: [id number]  HPI 14 yowf quit smoking nov 2013 at [facility] Hip fx surgery > [facility] for rehab then breathing worse x 2 weeks  With dysphagia, hoarseness, sob with anything more than slow adls in setting of recurrent vomiting and diarrhea eval by [facility] [place] 10/9 rx neb x one, rec increase albuterol, and started prednisone.  Assoc with cough but this is dry, no purulent sputum, more day than night  Sleeping ok without nocturnal  or early am exacerbation  of respiratory  c/o's or need for noct saba. Also denies any obvious fluctuation of symptoms with weather or environmental changes or other aggravating or alleviating factors except as outlined above  ROS  The following are not active complaints unless bolded sore throat, dysphagia, dental problems, itching, sneezing,  nasal congestion or excess/ purulent secretions, ear ache,   fever, chills, sweats, unintended wt loss, pleuritic or exertional cp, hemoptysis,  orthopnea pnd or leg swelling, presyncope, palpitations, heartburn, abdominal pain, anorexia, nausea, vomiting, diarrhea  or change in bowel or urinary habits, change in stools or urine, dysuria,hematuria,  rash, arthralgias, visual complaints, headache, numbness weakness or ataxia or problems with walking or coordination,  change in mood/affect or memory.        Review of Systems     Objective:   Physical Exam     Wt Readings from Last 3 Encounters:  11/30/12 79 lb (35.834 kg)  10/07/12 88 lb (39.917 kg)  10/07/12 88 lb (39.917 kg)   hoarse very anxious chronically ill wf nad HEENT mild turbinate edema.  Oropharynx no thrush or excess pnd or cobblestoning.  No JVD or cervical adenopathy. Mild accessory muscle hypertrophy. Trachea midline, nl thryroid. Chest was hyperinflated by percussion with diminished breath sounds and moderate increased exp time without wheeze. Hoover sign positive at mid inspiration. Regular  rate and rhythm without murmur gallop or rub or increase P2 or edema.  Abd: no hsm, nl excursion. Ext warm without cyanosis or clubbing.     cxr 11/26/12  copd only  Assessment:          Plan:

## 2012-11-30 NOTE — Assessment & Plan Note (Addendum)
The proper method of use, as well as anticipated side effects, of a metered-dose inhaler are discussed and demonstrated to the patient. Improved effectiveness after extensive coaching during this visit to a level of approximately  25-90% with dpi  DDX of  difficult airways managment all start with A and  include Adherence, Ace Inhibitors, Acid Reflux, Active Sinus Disease, Alpha 1 Antitripsin deficiency, Anxiety masquerading as Airways dz,  ABPA,  allergy(esp in young), Aspiration (esp in elderly), Adverse effects of DPI,  Active smokers, plus two Bs  = Bronchiectasis and Beta blocker use..and one C= CHF   Adherence is always the initial "prime suspect" and is a multilayered concern that requires a "trust but verify" approach in every patient - starting with knowing how to use medications, especially inhalers, correctly, keeping up with refills and understanding the fundamental difference between maintenance and prns vs those medications only taken for a very short course and then stopped and not refilled. See mdi training. Believe she'd do better just with spiriva and add the symbicort if worse p round of prednisone.  ? Anxiety brought on by lack of cig  ? Actively smoking > denies  ? Acid reflux > rx

## 2012-11-30 NOTE — Progress Notes (Deleted)
d 

## 2012-12-01 ENCOUNTER — Encounter: Payer: Self-pay | Admitting: Internal Medicine

## 2012-12-01 ENCOUNTER — Encounter (HOSPITAL_COMMUNITY): Payer: Self-pay | Admitting: Internal Medicine

## 2012-12-01 ENCOUNTER — Emergency Department (HOSPITAL_COMMUNITY): Payer: Medicare Other

## 2012-12-01 ENCOUNTER — Encounter (HOSPITAL_COMMUNITY): Payer: Self-pay | Admitting: Emergency Medicine

## 2012-12-01 ENCOUNTER — Inpatient Hospital Stay (HOSPITAL_COMMUNITY)
Admission: EM | Admit: 2012-12-01 | Discharge: 2012-12-05 | DRG: 481 | Disposition: A | Discharge status: Skilled Nursing Facility | Payer: Medicare Other | Attending: Internal Medicine | Admitting: Internal Medicine

## 2012-12-01 DIAGNOSIS — M549 Dorsalgia, unspecified: Secondary | ICD-10-CM | POA: Diagnosis present

## 2012-12-01 DIAGNOSIS — Z79899 Other long term (current) drug therapy: Secondary | ICD-10-CM

## 2012-12-01 DIAGNOSIS — F17200 Nicotine dependence, unspecified, uncomplicated: Secondary | ICD-10-CM

## 2012-12-01 DIAGNOSIS — D72829 Elevated white blood cell count, unspecified: Secondary | ICD-10-CM | POA: Diagnosis present

## 2012-12-01 DIAGNOSIS — T84049A Periprosthetic fracture around unspecified internal prosthetic joint, initial encounter: Principal | ICD-10-CM | POA: Diagnosis present

## 2012-12-01 DIAGNOSIS — E049 Nontoxic goiter, unspecified: Secondary | ICD-10-CM

## 2012-12-01 DIAGNOSIS — E860 Dehydration: Secondary | ICD-10-CM

## 2012-12-01 DIAGNOSIS — M1990 Unspecified osteoarthritis, unspecified site: Secondary | ICD-10-CM

## 2012-12-01 DIAGNOSIS — R071 Chest pain on breathing: Secondary | ICD-10-CM | POA: Diagnosis present

## 2012-12-01 DIAGNOSIS — Z87891 Personal history of nicotine dependence: Secondary | ICD-10-CM

## 2012-12-01 DIAGNOSIS — B370 Candidal stomatitis: Secondary | ICD-10-CM

## 2012-12-01 DIAGNOSIS — Z96649 Presence of unspecified artificial hip joint: Secondary | ICD-10-CM

## 2012-12-01 DIAGNOSIS — R079 Chest pain, unspecified: Secondary | ICD-10-CM

## 2012-12-01 DIAGNOSIS — J189 Pneumonia, unspecified organism: Secondary | ICD-10-CM

## 2012-12-01 DIAGNOSIS — R296 Repeated falls: Secondary | ICD-10-CM | POA: Diagnosis present

## 2012-12-01 DIAGNOSIS — E785 Hyperlipidemia, unspecified: Secondary | ICD-10-CM

## 2012-12-01 DIAGNOSIS — G8929 Other chronic pain: Secondary | ICD-10-CM | POA: Diagnosis present

## 2012-12-01 DIAGNOSIS — J984 Other disorders of lung: Secondary | ICD-10-CM

## 2012-12-01 DIAGNOSIS — S7290XA Unspecified fracture of unspecified femur, initial encounter for closed fracture: Secondary | ICD-10-CM

## 2012-12-01 DIAGNOSIS — F411 Generalized anxiety disorder: Secondary | ICD-10-CM | POA: Diagnosis present

## 2012-12-01 DIAGNOSIS — Z66 Do not resuscitate: Secondary | ICD-10-CM | POA: Diagnosis present

## 2012-12-01 DIAGNOSIS — D500 Iron deficiency anemia secondary to blood loss (chronic): Secondary | ICD-10-CM

## 2012-12-01 DIAGNOSIS — J45909 Unspecified asthma, uncomplicated: Secondary | ICD-10-CM

## 2012-12-01 DIAGNOSIS — J309 Allergic rhinitis, unspecified: Secondary | ICD-10-CM

## 2012-12-01 DIAGNOSIS — J441 Chronic obstructive pulmonary disease with (acute) exacerbation: Secondary | ICD-10-CM

## 2012-12-01 DIAGNOSIS — C3490 Malignant neoplasm of unspecified part of unspecified bronchus or lung: Secondary | ICD-10-CM

## 2012-12-01 DIAGNOSIS — D62 Acute posthemorrhagic anemia: Secondary | ICD-10-CM | POA: Diagnosis not present

## 2012-12-01 DIAGNOSIS — E876 Hypokalemia: Secondary | ICD-10-CM

## 2012-12-01 DIAGNOSIS — E039 Hypothyroidism, unspecified: Secondary | ICD-10-CM

## 2012-12-01 DIAGNOSIS — J449 Chronic obstructive pulmonary disease, unspecified: Secondary | ICD-10-CM

## 2012-12-01 DIAGNOSIS — J4489 Other specified chronic obstructive pulmonary disease: Secondary | ICD-10-CM | POA: Diagnosis present

## 2012-12-01 DIAGNOSIS — Z85118 Personal history of other malignant neoplasm of bronchus and lung: Secondary | ICD-10-CM

## 2012-12-01 DIAGNOSIS — F329 Major depressive disorder, single episode, unspecified: Secondary | ICD-10-CM

## 2012-12-01 DIAGNOSIS — I10 Essential (primary) hypertension: Secondary | ICD-10-CM | POA: Diagnosis present

## 2012-12-01 DIAGNOSIS — S72009A Fracture of unspecified part of neck of unspecified femur, initial encounter for closed fracture: Secondary | ICD-10-CM

## 2012-12-01 DIAGNOSIS — M810 Age-related osteoporosis without current pathological fracture: Secondary | ICD-10-CM | POA: Diagnosis present

## 2012-12-01 DIAGNOSIS — A0472 Enterocolitis due to Clostridium difficile, not specified as recurrent: Secondary | ICD-10-CM

## 2012-12-01 LAB — CBC WITH DIFFERENTIAL/PLATELET
Basophils Absolute: 0 10*3/uL (ref 0.0–0.1)
Basophils Relative: 0 % (ref 0–1)
Eosinophils Relative: 0 % (ref 0–5)
HCT: 44.1 % (ref 36.0–46.0)
MCHC: 33.6 g/dL (ref 30.0–36.0)
MCV: 93.2 fL (ref 78.0–100.0)
Monocytes Absolute: 0.3 10*3/uL (ref 0.1–1.0)
Platelets: 459 10*3/uL — ABNORMAL HIGH (ref 150–400)
RDW: 13.8 % (ref 11.5–15.5)
WBC: 7 10*3/uL (ref 4.0–10.5)

## 2012-12-01 LAB — BASIC METABOLIC PANEL
Calcium: 9.3 mg/dL (ref 8.4–10.5)
Creatinine, Ser: 0.59 mg/dL (ref 0.50–1.10)
GFR calc Af Amer: >90 mL/min (ref >90)
GFR calc non Af Amer: 86 mL/min — ABNORMAL LOW (ref >90)
Sodium: 136 mEq/L (ref 135–145)

## 2012-12-01 LAB — PROTIME-INR
INR: 0.98 (ref 0.00–1.49)
Prothrombin Time: 12.9 seconds (ref 11.6–15.2)

## 2012-12-01 LAB — SURGICAL PCR SCREEN: Staphylococcus aureus: NEGATIVE

## 2012-12-01 MED ORDER — METOPROLOL TARTRATE 1 MG/ML IV SOLN
5.0000 mg | Freq: Once | INTRAVENOUS | Status: AC
  Administered 2012-12-01: 5 mg via INTRAVENOUS
  Filled 2012-12-01: qty 5

## 2012-12-01 MED ORDER — HYDROMORPHONE HCL PF 1 MG/ML IJ SOLN
0.5000 mg | INTRAMUSCULAR | Status: DC | PRN
  Administered 2012-12-01: 0.5 mg via INTRAVENOUS
  Filled 2012-12-01: qty 1

## 2012-12-01 MED ORDER — HYDROMORPHONE HCL PF 1 MG/ML IJ SOLN
0.5000 mg | Freq: Once | INTRAMUSCULAR | Status: AC
  Administered 2012-12-01: 0.5 mg via INTRAVENOUS
  Filled 2012-12-01: qty 1

## 2012-12-01 MED ORDER — SODIUM CHLORIDE 0.9 % IV SOLN: INTRAVENOUS | Status: DC

## 2012-12-01 MED ORDER — PREDNISONE 10 MG PO TABS
10.0000 mg | ORAL_TABLET | Freq: Every day | ORAL | Status: DC
  Administered 2012-12-01 – 2012-12-05 (×5): 10 mg via ORAL
  Filled 2012-12-01 (×5): qty 1

## 2012-12-01 MED ORDER — ONDANSETRON HCL 4 MG/2ML IJ SOLN
4.0000 mg | Freq: Once | INTRAMUSCULAR | Status: AC
  Administered 2012-12-01: 4 mg via INTRAVENOUS
  Filled 2012-12-01: qty 2

## 2012-12-01 MED ORDER — BISOPROLOL FUMARATE 5 MG PO TABS
7.5000 mg | ORAL_TABLET | Freq: Every day | ORAL | Status: DC
  Administered 2012-12-02 – 2012-12-05 (×4): 7.5 mg via ORAL
  Filled 2012-12-01 (×4): qty 1.5

## 2012-12-01 MED ORDER — ONDANSETRON HCL 4 MG/2ML IJ SOLN: 4.0000 mg | Freq: Three times a day (TID) | INTRAMUSCULAR | Status: AC | PRN

## 2012-12-01 MED ORDER — LEVOTHYROXINE SODIUM 25 MCG PO TABS
25.0000 ug | ORAL_TABLET | Freq: Every day | ORAL | Status: DC
  Administered 2012-12-02 – 2012-12-05 (×4): 25 ug via ORAL
  Filled 2012-12-01 (×5): qty 1

## 2012-12-01 MED ORDER — HYDROMORPHONE HCL PF 2 MG/ML IJ SOLN
2.0000 mg | INTRAMUSCULAR | Status: DC | PRN
  Administered 2012-12-01 – 2012-12-02 (×4): 2 mg via INTRAVENOUS
  Filled 2012-12-01 (×4): qty 1

## 2012-12-01 MED ORDER — FENTANYL 50 MCG/HR TD PT72
50.0000 ug | MEDICATED_PATCH | TRANSDERMAL | Status: DC
  Administered 2012-12-04: 50 ug via TRANSDERMAL
  Filled 2012-12-01: qty 1

## 2012-12-01 MED ORDER — ALBUTEROL SULFATE HFA 108 (90 BASE) MCG/ACT IN AERS: 2.0000 | INHALATION_SPRAY | Freq: Four times a day (QID) | RESPIRATORY_TRACT | Status: DC | PRN

## 2012-12-01 MED ORDER — METOPROLOL TARTRATE 1 MG/ML IV SOLN
5.0000 mg | Freq: Once | INTRAVENOUS | Status: AC
  Filled 2012-12-01: qty 5

## 2012-12-01 MED ORDER — HYDROMORPHONE HCL PF 1 MG/ML IJ SOLN: 0.5000 mg | INTRAMUSCULAR | Status: DC | PRN

## 2012-12-01 MED ORDER — ENOXAPARIN SODIUM 30 MG/0.3ML ~~LOC~~ SOLN
30.0000 mg | SUBCUTANEOUS | Status: DC
  Administered 2012-12-01 – 2012-12-02 (×2): 30 mg via SUBCUTANEOUS
  Filled 2012-12-01 (×3): qty 0.3

## 2012-12-01 MED ORDER — BUPROPION HCL ER (XL) 300 MG PO TB24
300.0000 mg | ORAL_TABLET | Freq: Every day | ORAL | Status: DC
  Administered 2012-12-02 – 2012-12-05 (×4): 300 mg via ORAL
  Filled 2012-12-01 (×4): qty 1

## 2012-12-01 MED ORDER — TIOTROPIUM BROMIDE MONOHYDRATE 18 MCG IN CAPS
18.0000 ug | ORAL_CAPSULE | Freq: Every day | RESPIRATORY_TRACT | Status: DC
  Administered 2012-12-02 – 2012-12-05 (×4): 18 ug via RESPIRATORY_TRACT
  Filled 2012-12-01: qty 5

## 2012-12-01 MED ORDER — SODIUM CHLORIDE 0.9 % IV SOLN
INTRAVENOUS | Status: AC
  Administered 2012-12-01: 16:00:00 via INTRAVENOUS

## 2012-12-01 MED ORDER — HYDRALAZINE HCL 20 MG/ML IJ SOLN
10.0000 mg | Freq: Four times a day (QID) | INTRAMUSCULAR | Status: DC | PRN
  Administered 2012-12-01: 10 mg via INTRAVENOUS
  Filled 2012-12-01: qty 1

## 2012-12-01 MED ORDER — AMLODIPINE BESYLATE 10 MG PO TABS
10.0000 mg | ORAL_TABLET | Freq: Every day | ORAL | Status: DC
  Administered 2012-12-02 – 2012-12-05 (×4): 10 mg via ORAL
  Filled 2012-12-01 (×4): qty 1

## 2012-12-01 MED ORDER — DIAZEPAM 5 MG/ML IJ SOLN
1.0000 mg | Freq: Once | INTRAMUSCULAR | Status: AC
  Administered 2012-12-01: 1 mg via INTRAVENOUS
  Filled 2012-12-01: qty 2

## 2012-12-01 MED ORDER — BUDESONIDE-FORMOTEROL FUMARATE 160-4.5 MCG/ACT IN AERO
2.0000 | INHALATION_SPRAY | Freq: Two times a day (BID) | RESPIRATORY_TRACT | Status: DC
  Administered 2012-12-01 – 2012-12-05 (×8): 2 via RESPIRATORY_TRACT
  Filled 2012-12-01: qty 6

## 2012-12-01 MED ORDER — METHOCARBAMOL 100 MG/ML IJ SOLN
500.0000 mg | Freq: Three times a day (TID) | INTRAVENOUS | Status: DC | PRN
  Administered 2012-12-01 – 2012-12-02 (×2): 500 mg via INTRAVENOUS
  Filled 2012-12-01 (×2): qty 5

## 2012-12-01 NOTE — ED Notes (Signed)
Patient transported to X-ray 

## 2012-12-01 NOTE — ED Provider Notes (Signed)
History     CSN: [id number]  Arrival date & time 12/01/12  1221   First MD Initiated Contact with Patient 12/01/12 1224      Chief Complaint  Patient presents with  . Fall  . Leg Injury    L Femur deformity    (Consider location/radiation/quality/duration/timing/severity/associated sxs/prior treatment) HPI Comments: Patient presents via EMS after a fall while carrying groceries. She denies hitting her head or losing consciousness. She has pain and deformity to her left thigh. She is not on any blood thinners. Her history is remarkable for a left hip fracture on October 23 repaired by Dr. Charlann Boxer. She is unable to bear weight on the leg. She required 300 mg of fentanyl via EMS. She denies any headache, neck pain, back pain, chest pain or abdominal pain. She denies any focal weakness, numbness or tingling.  The history is provided by the patient.    Past Medical History  Diagnosis Date  . lung ca dx'd 08/2010    xrt comp 10/2010  . Hypertension   . Lung cancer     LUL, LLL lung  . Osteoporosis   . Diverticulitis   . Chronic back pain     Past Surgical History  Procedure Date  . Hip arthroplasty 10/10/2012    Procedure: ARTHROPLASTY BIPOLAR HIP;  Surgeon: Shelda Pal, MD;  Location: [facility];  Service: Orthopedics;  Laterality: Left;    Family History  Problem Relation Age of Onset  . Cancer Mother     lung  . Cancer Father     stomach    History  Substance Use Topics  . Smoking status: Former Smoker -- 1.0 packs/day for 62 years  . Smokeless tobacco: Not on file  . Alcohol Use: No    OB History    Grav Para Term Preterm Abortions TAB SAB Ect Mult Living                  Review of Systems  Unable to perform ROS: Other    Allergies  Aspirin; Morphine and related; and Penicillins  Home Medications   Current Outpatient Rx  Name  Route  Sig  Dispense  Refill  . ALBUTEROL SULFATE HFA 108 (90 BASE) MCG/ACT IN AERS   Inhalation   Inhale 2 puffs into the  lungs every 6 (six) hours as needed. For wheezing         . AMLODIPINE BESYLATE 10 MG PO TABS   Oral   Take 10 mg by mouth daily.         Marland Kitchen BISOPROLOL FUMARATE 5 MG PO TABS   Oral   Take 7.5 mg by mouth daily. Pt takes 1.5 tablet         . BUDESONIDE-FORMOTEROL FUMARATE 160-4.5 MCG/ACT IN AERO   Inhalation   Inhale 2 puffs into the lungs 2 (two) times daily.         . BUPROPION HCL ER (XL) 300 MG PO TB24   Oral   Take 300 mg by mouth daily.         . FENTANYL 50 MCG/HR TD PT72   Transdermal   Place 1 patch onto the skin every 3 (three) days.         Marland Kitchen HYDROCODONE-ACETAMINOPHEN 5-325 MG PO TABS   Oral   Take 0.5 tablets by mouth every 4 (four) hours as needed. For pain         . LEVOTHYROXINE SODIUM 25 MCG PO TABS   Oral  Take 25 mcg by mouth daily.         Marland Kitchen ONDANSETRON HCL 4 MG PO TABS   Oral   Take 1 tablet (4 mg total) by mouth every 6 (six) hours as needed for nausea.   20 tablet   0   . PREDNISONE 10 MG PO TABS   Oral   Take 10 mg by mouth daily. Take 4 tabs every morning for 2 days, 2 tabs every morning for 2 days, 1 tabs every morning for 2 days then stop.         Marland Kitchen TIOTROPIUM BROMIDE MONOHYDRATE 18 MCG IN CAPS   Inhalation   Place 1 capsule (18 mcg total) into inhaler and inhale daily.   30 capsule   0     BP 162/84  Pulse 92  Temp 97.7 F (36.5 C) (Oral)  Resp 18  Ht 5' (1.524 m)  Wt 79 lb (35.834 kg)  BMI 15.43 kg/m2  SpO2 98%  Physical Exam  Constitutional: She is oriented to person, place, and time. She appears well-developed and well-nourished. No distress.  HENT:  Head: Normocephalic.  Mouth/Throat: Oropharynx is clear and moist. No oropharyngeal exudate.  Eyes: Conjunctivae normal and EOM are normal. Pupils are equal, round, and reactive to light.  Neck: Normal range of motion.       No C spine pain, stepoff or deformity  Cardiovascular: Normal rate, regular rhythm and normal heart sounds.   No murmur  heard. Pulmonary/Chest: Effort normal and breath sounds normal. No respiratory distress.  Abdominal: Soft. There is no tenderness. There is no rebound and no guarding.  Musculoskeletal: She exhibits edema and tenderness.       Deformity to left femur and hip. +2 femoral, DP and PT pulses. Able to wiggle toes. LLE shortening with external rotation. Abrasion to left elbow, bilateral dorsal hands  Neurological: She is alert and oriented to person, place, and time. No cranial nerve deficit. She exhibits normal muscle tone. Coordination normal.  Skin: Skin is warm.    ED Course  Procedures (including critical care time)  Labs Reviewed  CBC WITH DIFFERENTIAL - Abnormal; Notable for the following:    Platelets 459 (*)     Neutrophils Relative 84 (*)     Lymphocytes Relative 11 (*)     All other components within normal limits  BASIC METABOLIC PANEL - Abnormal; Notable for the following:    Glucose, Bld 141 (*)     GFR calc non Af Amer 86 (*)     All other components within normal limits  PROTIME-INR   Dg Chest 1 View  12/01/2012  *RADIOLOGY REPORT*  Clinical Data: Fall.  Shortness of breath.  COPD.  CHEST - 1 VIEW  Comparison: 11/26/2012  Findings: Heart size is normal.  Patient is partially rotated to the left.  Both lungs are clear.  No evidence of pneumothorax or hemothorax.  No evidence of mediastinal widening.  Left shoulder prosthesis again noted.  IMPRESSION: No acute findings.   Original Report Authenticated By: Myles Rosenthal, M.D.    Dg Hip Complete Left  12/01/2012  *RADIOLOGY REPORT*  Clinical Data: Recent left hip arthroplasty.  The  LEFT HIP - COMPLETE 2+ VIEW  Comparison: Plain films 10/07/2012  Findings: There is a fracture of the proximal femoral diaphysis at the level of the tip of the femoral prosthetic with 3.5  cm override.  The hip is located.  IMPRESSION:  Acute femoral fracture at the tip of the prosthetic  with 3.5 cm override.   Original Report Authenticated By: Genevive Bi, M.D.    Dg Femur Left  12/01/2012  *RADIOLOGY REPORT*  Clinical Data: Recent left hip replacement  LEFT FEMUR - 2 VIEW  Comparison: None.  Findings: There is a fracture of the midshaft left femur at the tip of the femoral prosthetic.  There is approximately 3.5 cm of override with lateral displacement.  No evidence of distal femur fracture.  There is a total left knee arthroplasty which is intact.  IMPRESSION: Midshaft left femur fracture.   Original Report Authenticated By: Genevive Bi, M.D.      1. Femur fracture       MDM  Apparent mechanical fall with left hip deformity.  Neurovascularly intact. Did not hit head or lose consciousness. Neurologically intact.  Denies head, neck, chest, abdominal pain. X-ray shows periprosthetic left transverse femur fracture. Discussed with Dr. Lestine Box who will d/w Dr Charlann Boxer.  Dr. Lestine Box has seen patient.  Will plan OR on MOnday. Dr. Betti Cruz to admit for [facility].   Date: 12/01/2012  Rate: 79  Rhythm: normal sinus rhythm  QRS Axis: normal  Intervals: normal  ST/T Wave abnormalities: normal  Conduction Disutrbances:none  Narrative Interpretation: LVH  Old EKG Reviewed: unchanged    Glynn Octave, MD 12/01/12 1624

## 2012-12-01 NOTE — Progress Notes (Signed)
Brief Pharmacy Note: Lovenox for VTE ppx  S/O: Wt 35.8kg, CrCl 72ml/min. CBC ok.  A/P: <45kg, will start 30mg  q24h. Monitor for s/sx bleeding. May need Anti-Xa level.   Gwen Her PharmD  [phone] 12/01/2012 3:18 PM

## 2012-12-01 NOTE — ED Notes (Signed)
[id number]<AV> Expected date:12/01/12<BR> Expected time:11:49 AM<BR> Means of arrival:Ambulance<BR> Comments:<BR> Hip deformity

## 2012-12-01 NOTE — H&P (Signed)
[name] is an 78 y.o. female.   Chief Complaint: Left hip pain [id number] reports fall earlier today while getting groceries out of car. Denies any LOC, Dizziness, Chest pain . Recent left hip hemiarthroplasty October 23 by Dr. Charlann Boxer.  Past Medical History  Diagnosis Date  . lung ca dx'd 08/2010    xrt comp 10/2010  . Hypertension   . Lung cancer     LUL, LLL lung  . Osteoporosis   . Diverticulitis   . Chronic back pain     Past Surgical History  Procedure Date  . Hip arthroplasty 10/10/2012    Procedure: ARTHROPLASTY BIPOLAR HIP;  Surgeon: Shelda Pal, MD;  Location: [facility];  Service: Orthopedics;  Laterality: Left;    Family History  Problem Relation Age of Onset  . Cancer Mother     lung  . Cancer Father     stomach   Social History:  reports that she has quit smoking. She does not have any smokeless tobacco history on file. She reports that she does not drink alcohol or use illicit drugs.  Allergies:  Allergies  Allergen Reactions  . Aspirin     REACTION: gi upset  . Morphine And Related Other (See Comments)    Hallucinations  . Penicillins Nausea And Vomiting     (Not in a hospital admission)  Results for orders placed during the hospital encounter of 12/01/12 (from the past 48 hour(s))  CBC WITH DIFFERENTIAL     Status: Abnormal   Collection Time   12/01/12  1:42 PM      Component Value Range Comment   WBC 7.0  4.0 - 10.5 K/uL    RBC 4.73  3.87 - 5.11 MIL/uL    Hemoglobin 14.8  12.0 - 15.0 g/dL    HCT 09.8  11.9 - 14.7 %    MCV 93.2  78.0 - 100.0 fL    MCH 31.3  26.0 - 34.0 pg    MCHC 33.6  30.0 - 36.0 g/dL    RDW 82.9  56.2 - 13.0 %    Platelets 459 (*) 150 - 400 K/uL    Neutrophils Relative 84 (*) 43 - 77 %    Neutro Abs 5.9  1.7 - 7.7 K/uL    Lymphocytes Relative 11 (*) 12 - 46 %    Lymphs Abs 0.8  0.7 - 4.0 K/uL    Monocytes Relative 5  3 - 12 %    Monocytes Absolute 0.3  0.1 - 1.0 K/uL    Eosinophils Relative 0  0 - 5 %     Eosinophils Absolute 0.0  0.0 - 0.7 K/uL    Basophils Relative 0  0 - 1 %    Basophils Absolute 0.0  0.0 - 0.1 K/uL   BASIC METABOLIC PANEL     Status: Abnormal   Collection Time   12/01/12  1:42 PM      Component Value Range Comment   Sodium 136  135 - 145 mEq/L    Potassium 3.7  3.5 - 5.1 mEq/L    Chloride 99  96 - 112 mEq/L    CO2 25  19 - 32 mEq/L    Glucose, Bld 141 (*) 70 - 99 mg/dL    BUN 19  6 - 23 mg/dL    Creatinine, Ser 8.65  0.50 - 1.10 mg/dL    Calcium 9.3  8.4 - 78.4 mg/dL    GFR calc non Af Amer 86 (*) >90  mL/min    GFR calc Af Amer >90  >90 mL/min   PROTIME-INR     Status: Normal   Collection Time   12/01/12  1:42 PM      Component Value Range Comment   Prothrombin Time 12.9  11.6 - 15.2 seconds    INR 0.98  0.00 - 1.49    Dg Chest 1 View  12/01/2012  *RADIOLOGY REPORT*  Clinical Data: Fall.  Shortness of breath.  COPD.  CHEST - 1 VIEW  Comparison: 11/26/2012  Findings: Heart size is normal.  Patient is partially rotated to the left.  Both lungs are clear.  No evidence of pneumothorax or hemothorax.  No evidence of mediastinal widening.  Left shoulder prosthesis again noted.  IMPRESSION: No acute findings.   Original Report Authenticated By: Myles Rosenthal, M.D.    Dg Hip Complete Left  12/01/2012  *RADIOLOGY REPORT*  Clinical Data: Recent left hip arthroplasty.  The  LEFT HIP - COMPLETE 2+ VIEW  Comparison: Plain films 10/07/2012  Findings: There is a fracture of the proximal femoral diaphysis at the level of the tip of the femoral prosthetic with 3.5  cm override.  The hip is located.  IMPRESSION:  Acute femoral fracture at the tip of the prosthetic with 3.5 cm override.   Original Report Authenticated By: Genevive Bi, M.D.    Dg Femur Left  12/01/2012  *RADIOLOGY REPORT*  Clinical Data: Recent left hip replacement  LEFT FEMUR - 2 VIEW  Comparison: None.  Findings: There is a fracture of the midshaft left femur at the tip of the femoral prosthetic.  There is  approximately 3.5 cm of override with lateral displacement.  No evidence of distal femur fracture.  There is a total left knee arthroplasty which is intact.  IMPRESSION: Midshaft left femur fracture.   Original Report Authenticated By: Genevive Bi, M.D.     Review of Systems  Constitutional: Negative for fever.  Eyes: Eye discharge: wears glasses.  Respiratory: Positive for cough and shortness of breath.   Cardiovascular: Negative.   Gastrointestinal: Positive for heartburn.  Musculoskeletal:       Left hip pain  Neurological: Negative for dizziness and loss of consciousness.  Psychiatric/Behavioral: Negative.     Blood pressure 178/85, pulse 87, temperature 97.7 F (36.5 C), temperature source Oral, resp. rate 18, height 5' (1.524 m), weight 35.834 kg (79 lb), SpO2 98.00%. Physical Exam  Constitutional: She is oriented to person, place, and time. She appears well-developed and well-nourished.  HENT:  Head: Normocephalic and atraumatic.  Eyes: EOM are normal.  Cardiovascular: Normal rate and intact distal pulses.   Respiratory: Effort normal.  Musculoskeletal:       Non tender bilateral upper extremities. Right hip GROM without pian Bilateral lower legs without pain with palpation Left leg shortened and internal roation  Neurological: She is oriented to person, place, and time.       Sensation intact bilateral lower legs to light touch  Skin: Skin is warm and dry.       Abrasion right thumb and left elbow  Psychiatric: She has a normal mood and affect.     Assessment/Plan  Acute left  femoral fracture at the tip of the prosthetic  ORIF of left hip fracture in near future (yet to be determined by Dr. Charlann Boxer ) [facility] traction Bed rest  [place], [place] 12/01/2012, 3:03 PM

## 2012-12-01 NOTE — ED Notes (Signed)
Tape placed by ems removed from skin tear.  Tagaderm placed over skin tear on left elbow and right thumb.

## 2012-12-01 NOTE — ED Notes (Signed)
Pt in via [facility] post fall onto sidewalk. Upon arrival, pt had visible L femur deformity. Pt given 300 Fentanyl en route. Pt in NAD at this time.

## 2012-12-01 NOTE — Consult Note (Addendum)
[facility] Medical Consultation  [name] ZOX:[id number] DOB: [birth year] DOA: 12/01/2012 PCP: [name], MD   Requesting physician: Dr. Charlesetta Shanks Date of consultation: 12/01/12 Reason for consultation: medical management  Impression/Recommendations Active Problems:  * No active hospital problems. *   1. Hip fracture : we will f/u with ortho guidance for surgery. Cont pain meds, anticoagulation for now.  2. Uncontrolled HTN : Metoprolol IV PRN and this is most likely due to uncontrolled pain. 3. H/o tobacco use ; she has quit now. 4. Anxiety : cont home meds  I will followup again tomorrow. Please contact me if I can be of assistance in the meanwhile. Thank you for this consultation.  Chief Complaint: hip fracture  HPI:  Patient presents via EMS after a fall while carrying groceries. She remembers the fall. She had recent left hip surgery by Dr Charlann Boxer in 09/2012. ED eval showed fracture of left hip. She is in lots of pain. No other symptoms positive. She recently quit smoking.   Review of Systems:  14 ROS were done. ROS positive as per HPI otherwise all other negative.   Past Medical History  Diagnosis Date  . lung ca dx'd 08/2010    xrt comp 10/2010  . Hypertension   . Lung cancer     LUL, LLL lung  . Osteoporosis   . Diverticulitis   . Chronic back pain    Past Surgical History  Procedure Date  . Hip arthroplasty 10/10/2012    Procedure: ARTHROPLASTY BIPOLAR HIP;  Surgeon: Shelda Pal, MD;  Location: [facility];  Service: Orthopedics;  Laterality: Left;   Social History:  reports that she has quit smoking. She does not have any smokeless tobacco history on file. She reports that she does not drink alcohol or use illicit drugs.  Allergies  Allergen Reactions  . Aspirin     REACTION: gi upset  . Morphine And Related Other (See Comments)    Hallucinations  . Penicillins Nausea And Vomiting   Family History  Problem Relation Age of Onset  .  Cancer Mother     lung  . Cancer Father     stomach    Prior to Admission medications   Medication Sig Start Date End Date Taking? Authorizing Provider  albuterol (PROVENTIL HFA;VENTOLIN HFA) 108 (90 BASE) MCG/ACT inhaler Inhale 2 puffs into the lungs every 6 (six) hours as needed. For wheezing   Yes Historical Provider, MD  amLODipine (NORVASC) 10 MG tablet Take 10 mg by mouth daily.   Yes Historical Provider, MD  bisoprolol (ZEBETA) 5 MG tablet Take 7.5 mg by mouth daily. Pt takes 1.5 tablet   Yes Historical Provider, MD  budesonide-formoterol (SYMBICORT) 160-4.5 MCG/ACT inhaler Inhale 2 puffs into the lungs 2 (two) times daily.   Yes Historical Provider, MD  buPROPion (WELLBUTRIN XL) 300 MG 24 hr tablet Take 300 mg by mouth daily.   Yes Historical Provider, MD  fentaNYL (DURAGESIC - DOSED MCG/HR) 50 MCG/HR Place 1 patch onto the skin every 3 (three) days.   Yes Historical Provider, MD  HYDROcodone-acetaminophen (NORCO/VICODIN) 5-325 MG per tablet Take 0.5 tablets by mouth every 4 (four) hours as needed. For pain   Yes Historical Provider, MD  levothyroxine (SYNTHROID, LEVOTHROID) 25 MCG tablet Take 25 mcg by mouth daily.   Yes Historical Provider, MD  ondansetron (ZOFRAN) 4 MG tablet Take 1 tablet (4 mg total) by mouth every 6 (six) hours as needed for nausea. 10/03/12  Yes Alison Murray,  MD  predniSONE (DELTASONE) 10 MG tablet Take 10 mg by mouth daily. Take 4 tabs every morning for 2 days, 2 tabs every morning for 2 days, 1 tabs every morning for 2 days then stop. 12/01/12 12/06/12 Yes Historical Provider, MD  tiotropium (SPIRIVA) 18 MCG inhalation capsule Place 1 capsule (18 mcg total) into inhaler and inhale daily. 10/03/12  Yes Alison Murray, MD   Physical Exam: Blood pressure 201/97, pulse 83, temperature 98.2 F (36.8 C), temperature source Oral, resp. rate 16, height 5' (1.524 m), weight 35.834 kg (79 lb), SpO2 95.00%. Filed Vitals:   12/01/12 1410 12/01/12 1550 12/01/12 1658  12/01/12 1829  BP: 178/85 162/84 204/104 201/97  Pulse:  92 76 83  Temp:   98.2 F (36.8 C)   TempSrc:   Oral   Resp:   16   Height:      Weight:      SpO2:  98% 95%      General:  Thin, frail, in pain  Eyes: PEERLA, EOMI  ENT: OMM, no trush  Neck: NO LN or JVD  Cardiovascular: S1S2 reg, no m/r/g  Respiratory: CTAB, no w/r/c  Abdomen: NT, ND, BS+  Skin: no rash or bruises  Musculoskeletal: could not examine due to her pain  Psychiatric: good mood  Neurologic: NO FND  Labs on Admission:  Basic Metabolic Panel:  Lab 12/01/12 1610  NA 136  K 3.7  CL 99  CO2 25  GLUCOSE 141*  BUN 19  CREATININE 0.59  CALCIUM 9.3  MG --  PHOS --   Liver Function Tests: No results found for this basename: AST:5,ALT:5,ALKPHOS:5,BILITOT:5,PROT:5,ALBUMIN:5 in the last 168 hours No results found for this basename: LIPASE:5,AMYLASE:5 in the last 168 hours No results found for this basename: AMMONIA:5 in the last 168 hours CBC:  Lab 12/01/12 1342  WBC 7.0  NEUTROABS 5.9  HGB 14.8  HCT 44.1  MCV 93.2  PLT 459*   Cardiac Enzymes: No results found for this basename: CKTOTAL:5,CKMB:5,CKMBINDEX:5,TROPONINI:5 in the last 168 hours BNP: No components found with this basename: POCBNP:5 CBG: No results found for this basename: GLUCAP:5 in the last 168 hours  Radiological Exams on Admission: Dg Chest 1 View  12/01/2012  *RADIOLOGY REPORT*  Clinical Data: Fall.  Shortness of breath.  COPD.  CHEST - 1 VIEW  Comparison: 11/26/2012  Findings: Heart size is normal.  Patient is partially rotated to the left.  Both lungs are clear.  No evidence of pneumothorax or hemothorax.  No evidence of mediastinal widening.  Left shoulder prosthesis again noted.  IMPRESSION: No acute findings.   Original Report Authenticated By: Myles Rosenthal, M.D.    Dg Hip Complete Left  12/01/2012  *RADIOLOGY REPORT*  Clinical Data: Recent left hip arthroplasty.  The  LEFT HIP - COMPLETE 2+ VIEW  Comparison:  Plain films 10/07/2012  Findings: There is a fracture of the proximal femoral diaphysis at the level of the tip of the femoral prosthetic with 3.5  cm override.  The hip is located.  IMPRESSION:  Acute femoral fracture at the tip of the prosthetic with 3.5 cm override.   Original Report Authenticated By: Genevive Bi, M.D.    Dg Femur Left  12/01/2012  *RADIOLOGY REPORT*  Clinical Data: Recent left hip replacement  LEFT FEMUR - 2 VIEW  Comparison: None.  Findings: There is a fracture of the midshaft left femur at the tip of the femoral prosthetic.  There is approximately 3.5 cm of override with lateral displacement.  No evidence of distal femur fracture.  There is a total left knee arthroplasty which is intact.  IMPRESSION: Midshaft left femur fracture.   Original Report Authenticated By: Genevive Bi, M.D.    Time spent: 1 hour  Aydyn Testerman V. [facility] Pager [phone]  If 7PM-7AM, please contact night-coverage [email or web address] Password [facility] 12/01/2012, 7:51 PM

## 2012-12-02 DIAGNOSIS — G8929 Other chronic pain: Secondary | ICD-10-CM

## 2012-12-02 DIAGNOSIS — M549 Dorsalgia, unspecified: Secondary | ICD-10-CM

## 2012-12-02 DIAGNOSIS — J449 Chronic obstructive pulmonary disease, unspecified: Secondary | ICD-10-CM

## 2012-12-02 LAB — COMPREHENSIVE METABOLIC PANEL
ALT: 7 U/L (ref 0–35)
AST: 11 U/L (ref 0–37)
CO2: 23 mEq/L (ref 19–32)
Chloride: 99 mEq/L (ref 96–112)
GFR calc non Af Amer: >90 mL/min (ref >90)
Sodium: 135 mEq/L (ref 135–145)
Total Bilirubin: 0.2 mg/dL — ABNORMAL LOW (ref 0.3–1.2)

## 2012-12-02 LAB — CBC WITH DIFFERENTIAL/PLATELET
Basophils Absolute: 0 10*3/uL (ref 0.0–0.1)
HCT: 41.6 % (ref 36.0–46.0)
Lymphocytes Relative: 12 % (ref 12–46)
Monocytes Absolute: 2.1 10*3/uL — ABNORMAL HIGH (ref 0.1–1.0)
Neutro Abs: 11 10*3/uL — ABNORMAL HIGH (ref 1.7–7.7)
Platelets: 477 10*3/uL — ABNORMAL HIGH (ref 150–400)
RBC: 4.44 MIL/uL (ref 3.87–5.11)
RDW: 13.9 % (ref 11.5–15.5)
WBC: 14.9 10*3/uL — ABNORMAL HIGH (ref 4.0–10.5)

## 2012-12-02 LAB — PREPARE RBC (CROSSMATCH)

## 2012-12-02 MED ORDER — HYDROMORPHONE HCL PF 1 MG/ML IJ SOLN
0.5000 mg | INTRAMUSCULAR | Status: DC | PRN
  Administered 2012-12-02 – 2012-12-04 (×6): 0.5 mg via INTRAVENOUS
  Filled 2012-12-02 (×7): qty 1

## 2012-12-02 MED ORDER — DEXTROSE 5 % IV SOLN
500.0000 mg | Freq: Four times a day (QID) | INTRAVENOUS | Status: DC
  Administered 2012-12-02 – 2012-12-05 (×9): 500 mg via INTRAVENOUS
  Filled 2012-12-02 (×15): qty 5

## 2012-12-02 MED ORDER — ACETAMINOPHEN 325 MG PO TABS: 650.0000 mg | ORAL_TABLET | Freq: Four times a day (QID) | ORAL | Status: DC | PRN

## 2012-12-02 MED ORDER — ZOLPIDEM TARTRATE 5 MG PO TABS
5.0000 mg | ORAL_TABLET | Freq: Every evening | ORAL | Status: DC | PRN
  Administered 2012-12-04: 5 mg via ORAL
  Filled 2012-12-02: qty 1

## 2012-12-02 MED ORDER — POTASSIUM CHLORIDE CRYS ER 20 MEQ PO TBCR
40.0000 meq | EXTENDED_RELEASE_TABLET | Freq: Once | ORAL | Status: AC
  Administered 2012-12-02: 40 meq via ORAL
  Filled 2012-12-02: qty 2

## 2012-12-02 MED ORDER — HYDROCODONE-ACETAMINOPHEN 5-325 MG PO TABS
0.5000 | ORAL_TABLET | ORAL | Status: DC | PRN
Start: 2012-12-02 — End: 2012-12-04
  Administered 2012-12-02 – 2012-12-04 (×5): 0.5 via ORAL
  Filled 2012-12-02 (×5): qty 1

## 2012-12-02 NOTE — Progress Notes (Signed)
[facility] PROGRESS NOTE  [name] FAO:[id number] DOB: March 16, 1932 DOA: 12/01/2012 PCP: [name], MD Pulmonologist: Sandrea Hughs, MD  Assessment/Plan: 1. Periprosthetic femur fracture s/p mechanical fall--per orthopedics. S/p hip fracture and surgery 10/23. For surgery 12/15. EKG SR, repolarization abnormality. 2. Uncontrolled HTN--secondary to pain. Continue home medications, control pain and spasms. 3. COPD--stable, continue Spiriva, Symbicort. 4. Anxiety--stable. Continue Wellbutrin. 5. Chronic back pain--continue Fentanyl patch, Lortab PRN.  Code Status: DNR Family Communication: discussed with son at bedside Disposition Plan: likely SNF  Brendia Sacks, MD  [facility] Team 6 Pager [phone] If 8PM-8AM, please contact night-coverage at [email or web address], password [facility] 12/02/2012, 10:11 AM  LOS: 1 day   Brief narrative: 78 year old woman fell while carrying groceries sustaining periprosthetic femur fracture.  Consultants:  Orthopedics  Procedures:    HPI/Subjective: Complains of spasms and pain left leg.   Objective: Filed Vitals:   12/01/12 2145 12/02/12 0005 12/02/12 0517 12/02/12 0921  BP: 179/87 162/82 153/88   Pulse: 81  87   Temp: 99 F (37.2 C)  98.2 F (36.8 C)   TempSrc: Oral  Oral   Resp: 15  18   Height:      Weight:      SpO2: 100%  98% 98%    Intake/Output Summary (Last 24 hours) at 12/02/12 1011 Last data filed at 12/02/12 0600  Gross per 24 hour  Intake 1613.75 ml  Output   2650 ml  Net -1036.25 ml   Filed Weights   12/01/12 1226  Weight: 35.834 kg (79 lb)    Exam:  General:  Appears calm and comfortable Cardiovascular: RRR, no m/r/g. No LE edema. Respiratory: CTA bilaterally, no w/r/r. Normal respiratory effort. Psychiatric: grossly normal mood and affect, speech fluent and appropriate  Data Reviewed: Basic Metabolic Panel:  Lab 12/02/12 8413 12/01/12 1342  NA 135 136  K 3.3* 3.7  CL 99 99   CO2 23 25  GLUCOSE 99 141*  BUN 12 19  CREATININE 0.46* 0.59  CALCIUM 9.1 9.3  MG -- --  PHOS -- --   Liver Function Tests:  Lab 12/02/12 0506  AST 11  ALT 7  ALKPHOS 110  BILITOT 0.2*  PROT 6.6  ALBUMIN 2.9*   CBC:  Lab 12/02/12 0506 12/01/12 1342  WBC 14.9* 7.0  NEUTROABS 11.0* 5.9  HGB 13.9 14.8  HCT 41.6 44.1  MCV 93.7 93.2  PLT 477* 459*    Recent Results (from the past 240 hour(s))  SURGICAL PCR SCREEN     Status: Normal   Collection Time   12/01/12  5:06 PM      Component Value Range Status Comment   MRSA, PCR NEGATIVE  NEGATIVE Final    Staphylococcus aureus NEGATIVE  NEGATIVE Final      Studies: Dg Chest 1 View  12/01/2012  *RADIOLOGY REPORT*  Clinical Data: Fall.  Shortness of breath.  COPD.  CHEST - 1 VIEW  Comparison: 11/26/2012  Findings: Heart size is normal.  Patient is partially rotated to the left.  Both lungs are clear.  No evidence of pneumothorax or hemothorax.  No evidence of mediastinal widening.  Left shoulder prosthesis again noted.  IMPRESSION: No acute findings.   Original Report Authenticated By: Myles Rosenthal, M.D.    Dg Hip Complete Left  12/01/2012  *RADIOLOGY REPORT*  Clinical Data: Recent left hip arthroplasty.  The  LEFT HIP - COMPLETE 2+ VIEW  Comparison: Plain films 10/07/2012  Findings: There is a fracture of the proximal femoral  diaphysis at the level of the tip of the femoral prosthetic with 3.5  cm override.  The hip is located.  IMPRESSION:  Acute femoral fracture at the tip of the prosthetic with 3.5 cm override.   Original Report Authenticated By: Genevive Bi, M.D.    Dg Femur Left  12/01/2012  *RADIOLOGY REPORT*  Clinical Data: Recent left hip replacement  LEFT FEMUR - 2 VIEW  Comparison: None.  Findings: There is a fracture of the midshaft left femur at the tip of the femoral prosthetic.  There is approximately 3.5 cm of override with lateral displacement.  No evidence of distal femur fracture.  There is a total left knee  arthroplasty which is intact.  IMPRESSION: Midshaft left femur fracture.   Original Report Authenticated By: Genevive Bi, M.D.     Scheduled Meds:   . sodium chloride   Intravenous STAT  . amLODipine  10 mg Oral Daily  . bisoprolol  7.5 mg Oral Daily  . budesonide-formoterol  2 puff Inhalation BID  . buPROPion  300 mg Oral Daily  . enoxaparin (LOVENOX) injection  30 mg Subcutaneous Q24H  . fentaNYL  50 mcg Transdermal Q72H  . levothyroxine  25 mcg Oral Q0600  . potassium chloride  40 mEq Oral Once  . predniSONE  10 mg Oral Daily  . tiotropium  18 mcg Inhalation Daily   Continuous Infusions:   Active Problems:  * No active hospital problems. Brendia Sacks, MD  [facility] Team 6 Pager [phone] If 8PM-8AM, please contact night-coverage at [email or web address], password [facility] 12/02/2012, 10:11 AM  LOS: 1 day   Time spent: 20 minutes

## 2012-12-02 NOTE — Progress Notes (Signed)
Occupational Therapy Discharge Patient Details Name: [name] MRN: [id number] DOB: 1932-09-16 Today's Date: 12/02/2012 Time:  -     Patient discharged from OT services secondary to surgery - will need to re-order OT to resume therapy services.  Please see latest therapy progress note for current level of functioning and progress toward goals.    Progress and discharge plan discussed with patient and/or caregiver: OT to sign off pending ORIF surgery 12/02/12  GO     Lucile Shutters Pager: [phone]  12/02/2012, 8:03 AM

## 2012-12-02 NOTE — Progress Notes (Signed)
PT NOTE:  Order received.  Pt scheduled for ORIF this date.  Please reorder PT services post op as needed.

## 2012-12-02 NOTE — Progress Notes (Signed)
Clinical Social Work Department BRIEF PSYCHOSOCIAL ASSESSMENT 12/02/2012  Patient:  [name], [name]     Account Number:  [id number]     Admit date:  12/01/2012  Clinical Social Worker:  Leron Croak, CLINICAL SOCIAL WORKER  Date/Time:  12/02/2012 01:12 PM  Referred by:  Physician  Date Referred:  12/01/2012 Referred for  SNF Placement   Other Referral:   Interview type:  Patient Other interview type:    PSYCHOSOCIAL DATA Living Status:  FACILITY Admitted from facility:  [facility] Level of care:  Independent Living Primary support name:  Malachi Paradise Primary support relationship to patient:  CHILD, ADULT Degree of support available:   Pt has a good support system of family and friends in the community in which she resides.    CURRENT CONCERNS Current Concerns  Post-Acute Placement   Other Concerns:    SOCIAL WORK ASSESSMENT / PLAN CSW met with the Pt at the bedside for d/c planning. Pt stated that she was aware of referral and that she would like to do her rehab in the assisted living section where she currently lives. Pt stated that she did discuss this with the facility. CSW given permission to complete FL2 and send to facility for possible placement in the ALF at [facility].   Assessment/plan status:  Information/Referral to [facility] Other assessment/ plan:   Information/referral to community resources:   CSW provided a SNF listing for Pt in the event that her status and needs change.    PATIENT'S/FAMILY'S RESPONSE TO PLAN OF CARE: Pt was appreciative for assistance with d/c planning.   Leron Croak, LCSWA [employer] [phone]

## 2012-12-02 NOTE — Progress Notes (Signed)
Subjective: L thigh pain   Objective: Vital signs in last 24 hours: Temp:  [97.7 F (36.5 C)-99 F (37.2 C)] 98.2 F (36.8 C) (12/15 0517) Pulse Rate:  [76-93] 87  (12/15 0517) Resp:  [15-18] 18  (12/15 0517) BP: (153-204)/(82-104) 153/88 mmHg (12/15 0517) SpO2:  [95 %-100 %] 98 % (12/15 0517) Weight:  [35.834 kg (79 lb)] 35.834 kg (79 lb) (12/14 1226)  Intake/Output from previous day: 12/14 0701 - 12/15 0700 In: 1613.8 [P.O.:120; I.V.:1438.8; IV Piggyback:55] Out: 2650 [Urine:2650] Intake/Output this shift:     [facility] 12/02/12 0506 12/01/12 1342  HGB 13.9 14.8    Basename 12/02/12 0506 12/01/12 1342  WBC 14.9* 7.0  RBC 4.44 4.73  HCT 41.6 44.1  PLT 477* 459*    Basename 12/02/12 0506 12/01/12 1342  NA 135 136  K 3.3* 3.7  CL 99 99  CO2 23 25  BUN 12 19  CREATININE 0.46* 0.59  GLUCOSE 99 141*  CALCIUM 9.1 9.3    Basename 12/01/12 1342  LABPT --  INR 0.98    No change neurovascular status   Assessment/Plan: Preop for surgery with Dr Charlann Boxer tomorrow Hold Lovenox at midnight tonight NPO after midnight Consented T and C 2 units K supplemented labs in am   Marquel Spoto A 12/02/2012, 9:03 AM

## 2012-12-03 ENCOUNTER — Ambulatory Visit: Payer: Medicare Other | Admitting: Internal Medicine

## 2012-12-03 ENCOUNTER — Encounter (HOSPITAL_COMMUNITY)
Admission: EM | Disposition: A | Discharge status: Skilled Nursing Facility | Payer: Self-pay | Source: Home / Self Care | Attending: Family Medicine

## 2012-12-03 ENCOUNTER — Inpatient Hospital Stay (HOSPITAL_COMMUNITY): Payer: Medicare Other | Admitting: Anesthesiology

## 2012-12-03 ENCOUNTER — Encounter (HOSPITAL_COMMUNITY): Payer: Self-pay | Admitting: Anesthesiology

## 2012-12-03 HISTORY — PX: ORIF PERIPROSTHETIC FRACTURE: SHX5034

## 2012-12-03 LAB — POTASSIUM: Potassium: 4.3 mEq/L (ref 3.5–5.1)

## 2012-12-03 SURGERY — OPEN REDUCTION INTERNAL FIXATION (ORIF) PERIPROSTHETIC FRACTURE
Anesthesia: General | Site: Hip | Laterality: Left | Wound class: Clean

## 2012-12-03 MED ORDER — FLEET ENEMA 7-19 GM/118ML RE ENEM: 1.0000 | ENEMA | Freq: Once | RECTAL | Status: AC | PRN

## 2012-12-03 MED ORDER — MENTHOL 3 MG MT LOZG: 1.0000 | LOZENGE | OROMUCOSAL | Status: DC | PRN

## 2012-12-03 MED ORDER — GLYCOPYRROLATE 0.2 MG/ML IJ SOLN
INTRAMUSCULAR | Status: DC | PRN
  Administered 2012-12-03: 0.2 mg via INTRAVENOUS

## 2012-12-03 MED ORDER — HYDROMORPHONE HCL PF 1 MG/ML IJ SOLN
0.5000 mg | Freq: Once | INTRAMUSCULAR | Status: AC
  Administered 2012-12-03: 0.5 mg via INTRAVENOUS

## 2012-12-03 MED ORDER — ROCURONIUM BROMIDE 100 MG/10ML IV SOLN
INTRAVENOUS | Status: DC | PRN
  Administered 2012-12-03: 10 mg via INTRAVENOUS

## 2012-12-03 MED ORDER — LACTATED RINGERS IV SOLN: INTRAVENOUS | Status: DC

## 2012-12-03 MED ORDER — SODIUM CHLORIDE 0.9 % IV SOLN
INTRAVENOUS | Status: DC
  Administered 2012-12-03 – 2012-12-04 (×2): via INTRAVENOUS

## 2012-12-03 MED ORDER — LIDOCAINE HCL (CARDIAC) 20 MG/ML IV SOLN
INTRAVENOUS | Status: DC | PRN
  Administered 2012-12-03: 35 mg via INTRAVENOUS

## 2012-12-03 MED ORDER — SUCCINYLCHOLINE CHLORIDE 20 MG/ML IJ SOLN
INTRAMUSCULAR | Status: DC | PRN
  Administered 2012-12-03: 40 mg via INTRAVENOUS

## 2012-12-03 MED ORDER — ACETAMINOPHEN 10 MG/ML IV SOLN
INTRAVENOUS | Status: DC | PRN
  Administered 2012-12-03: 1000 mg via INTRAVENOUS

## 2012-12-03 MED ORDER — PHENOL 1.4 % MT LIQD: 1.0000 | OROMUCOSAL | Status: DC | PRN

## 2012-12-03 MED ORDER — ONDANSETRON HCL 4 MG PO TABS: 4.0000 mg | ORAL_TABLET | Freq: Four times a day (QID) | ORAL | Status: DC | PRN

## 2012-12-03 MED ORDER — HYDROMORPHONE HCL PF 1 MG/ML IJ SOLN
INTRAMUSCULAR | Status: AC
  Filled 2012-12-03: qty 1

## 2012-12-03 MED ORDER — ROCURONIUM BROMIDE 100 MG/10ML IV SOLN: INTRAVENOUS | Status: DC | PRN

## 2012-12-03 MED ORDER — HYDRALAZINE HCL 20 MG/ML IJ SOLN
5.0000 mg | INTRAMUSCULAR | Status: DC | PRN
  Administered 2012-12-03 (×3): 5 mg via INTRAVENOUS

## 2012-12-03 MED ORDER — CLINDAMYCIN PHOSPHATE 600 MG/50ML IV SOLN
600.0000 mg | Freq: Four times a day (QID) | INTRAVENOUS | Status: AC
  Administered 2012-12-04 (×2): 600 mg via INTRAVENOUS
  Filled 2012-12-03 (×2): qty 50

## 2012-12-03 MED ORDER — NEOSTIGMINE METHYLSULFATE 1 MG/ML IJ SOLN
INTRAMUSCULAR | Status: DC | PRN
  Administered 2012-12-03: 2 mg via INTRAVENOUS

## 2012-12-03 MED ORDER — ONDANSETRON HCL 4 MG/2ML IJ SOLN
INTRAMUSCULAR | Status: DC | PRN
  Administered 2012-12-03: 4 mg via INTRAVENOUS

## 2012-12-03 MED ORDER — HYDROMORPHONE HCL PF 1 MG/ML IJ SOLN
0.2500 mg | INTRAMUSCULAR | Status: DC | PRN
  Administered 2012-12-03 (×4): 0.5 mg via INTRAVENOUS

## 2012-12-03 MED ORDER — BISACODYL 10 MG RE SUPP: 10.0000 mg | Freq: Every day | RECTAL | Status: DC | PRN

## 2012-12-03 MED ORDER — METOCLOPRAMIDE HCL 10 MG PO TABS: 5.0000 mg | ORAL_TABLET | Freq: Three times a day (TID) | ORAL | Status: DC | PRN

## 2012-12-03 MED ORDER — ENOXAPARIN SODIUM 30 MG/0.3ML ~~LOC~~ SOLN
30.0000 mg | SUBCUTANEOUS | Status: DC
  Administered 2012-12-04 – 2012-12-05 (×2): 30 mg via SUBCUTANEOUS
  Filled 2012-12-03 (×3): qty 0.3

## 2012-12-03 MED ORDER — PROMETHAZINE HCL 25 MG/ML IJ SOLN: 6.2500 mg | INTRAMUSCULAR | Status: DC | PRN

## 2012-12-03 MED ORDER — PROPOFOL 10 MG/ML IV BOLUS
INTRAVENOUS | Status: DC | PRN
  Administered 2012-12-03: 80 mg via INTRAVENOUS

## 2012-12-03 MED ORDER — ONDANSETRON HCL 4 MG/2ML IJ SOLN: 4.0000 mg | Freq: Four times a day (QID) | INTRAMUSCULAR | Status: DC | PRN

## 2012-12-03 MED ORDER — METOCLOPRAMIDE HCL 5 MG/ML IJ SOLN: 5.0000 mg | Freq: Three times a day (TID) | INTRAMUSCULAR | Status: DC | PRN

## 2012-12-03 MED ORDER — POLYETHYLENE GLYCOL 3350 17 G PO PACK: 17.0000 g | PACK | Freq: Every day | ORAL | Status: DC | PRN

## 2012-12-03 MED ORDER — FERROUS SULFATE 325 (65 FE) MG PO TABS
325.0000 mg | ORAL_TABLET | Freq: Three times a day (TID) | ORAL | Status: DC
  Administered 2012-12-04 – 2012-12-05 (×4): 325 mg via ORAL
  Filled 2012-12-03 (×7): qty 1

## 2012-12-03 MED ORDER — DOCUSATE SODIUM 100 MG PO CAPS
100.0000 mg | ORAL_CAPSULE | Freq: Two times a day (BID) | ORAL | Status: DC
  Administered 2012-12-04 – 2012-12-05 (×3): 100 mg via ORAL

## 2012-12-03 MED ORDER — STERILE WATER FOR IRRIGATION IR SOLN
Status: DC | PRN
  Administered 2012-12-03: 3000 mL

## 2012-12-03 MED ORDER — SODIUM CHLORIDE 0.9 % IR SOLN
Status: DC | PRN
  Administered 2012-12-03: 1000 mL

## 2012-12-03 MED ORDER — HYDRALAZINE HCL 20 MG/ML IJ SOLN
INTRAMUSCULAR | Status: AC
  Filled 2012-12-03: qty 1

## 2012-12-03 MED ORDER — LACTATED RINGERS IV SOLN
INTRAVENOUS | Status: DC | PRN
  Administered 2012-12-03 (×2): via INTRAVENOUS

## 2012-12-03 MED ORDER — FENTANYL CITRATE 0.05 MG/ML IJ SOLN
INTRAMUSCULAR | Status: DC | PRN
  Administered 2012-12-03 (×5): 50 ug via INTRAVENOUS

## 2012-12-03 SURGICAL SUPPLY — 57 items
BAG SPEC THK2 15X12 ZIP CLS (MISCELLANEOUS) ×1
BAG ZIPLOCK 12X15 (MISCELLANEOUS) ×2 IMPLANT
CABLE FOR BONE PLATE (Cable) ×5 IMPLANT
CLOTH BEACON ORANGE TIMEOUT ST (SAFETY) ×2 IMPLANT
DRAPE INCISE IOBAN 66X45 STRL (DRAPES) ×2 IMPLANT
DRAPE ORTHO SPLIT 77X108 STRL (DRAPES) ×4
DRAPE POUCH INSTRU U-SHP 10X18 (DRAPES) ×2 IMPLANT
DRAPE SURG 17X11 SM STRL (DRAPES) ×2 IMPLANT
DRAPE SURG ORHT 6 SPLT 77X108 (DRAPES) ×2 IMPLANT
DRAPE U-SHAPE 47X51 STRL (DRAPES) ×2 IMPLANT
DRILL 3.2MM ×1 IMPLANT
DRSG EMULSION OIL 3X16 NADH (GAUZE/BANDAGES/DRESSINGS) ×2 IMPLANT
DRSG MEPILEX BORDER 4X12 (GAUZE/BANDAGES/DRESSINGS) ×1 IMPLANT
DRSG MEPILEX BORDER 4X8 (GAUZE/BANDAGES/DRESSINGS) ×1 IMPLANT
DRSG PAD ABDOMINAL 8X10 ST (GAUZE/BANDAGES/DRESSINGS) ×2 IMPLANT
DURAPREP 26ML APPLICATOR (WOUND CARE) ×3 IMPLANT
ELECT BLADE TIP CTD 4 INCH (ELECTRODE) ×1 IMPLANT
ELECT REM PT RETURN 9FT ADLT (ELECTROSURGICAL) ×2
ELECTRODE REM PT RTRN 9FT ADLT (ELECTROSURGICAL) ×1 IMPLANT
EVACUATOR 1/8 PVC DRAIN (DRAIN) ×2 IMPLANT
FACESHIELD LNG OPTICON STERILE (SAFETY) ×10 IMPLANT
GLOVE BIOGEL PI IND STRL 7.5 (GLOVE) ×1 IMPLANT
GLOVE BIOGEL PI IND STRL 8 (GLOVE) ×1 IMPLANT
GLOVE BIOGEL PI INDICATOR 7.5 (GLOVE) ×1
GLOVE BIOGEL PI INDICATOR 8 (GLOVE) ×1
GLOVE ECLIPSE 8.0 STRL XLNG CF (GLOVE) IMPLANT
GLOVE ORTHO TXT STRL SZ7.5 (GLOVE) ×2 IMPLANT
GLOVE SURG ORTHO 8.0 STRL STRW (GLOVE) ×2 IMPLANT
GOWN BRE IMP PREV XXLGXLNG (GOWN DISPOSABLE) ×4 IMPLANT
GOWN STRL NON-REIN LRG LVL3 (GOWN DISPOSABLE) ×4 IMPLANT
IMMOBILIZER KNEE 20 (SOFTGOODS)
IMMOBILIZER KNEE 20 THIGH 36 (SOFTGOODS) IMPLANT
KIT BASIN OR (CUSTOM PROCEDURE TRAY) ×2 IMPLANT
MANIFOLD NEPTUNE II (INSTRUMENTS) ×2 IMPLANT
NS IRRIG 1000ML POUR BTL (IV SOLUTION) ×3 IMPLANT
PACK TOTAL JOINT (CUSTOM PROCEDURE TRAY) ×2 IMPLANT
PASSER SUT SWANSON 36MM LOOP (INSTRUMENTS) IMPLANT
PLATE CABLE BONE 8 HOLE (Plate) ×1 IMPLANT
POSITIONER SURGICAL ARM (MISCELLANEOUS) ×2 IMPLANT
SCREW BONE SELF TAP 4.5X35 (Screw) ×2 IMPLANT
SPONGE GAUZE 4X4 12PLY (GAUZE/BANDAGES/DRESSINGS) ×4 IMPLANT
SPONGE LAP 18X18 X RAY DECT (DISPOSABLE) ×1 IMPLANT
SPONGE LAP 4X18 X RAY DECT (DISPOSABLE) ×1 IMPLANT
STAPLER SKIN PROX WIDE 3.9 (STAPLE) ×2 IMPLANT
STAPLER VISISTAT (STAPLE) ×2 IMPLANT
STAPLER VISISTAT 35W (STAPLE) IMPLANT
STRIP CLOSURE SKIN 1/2X4 (GAUZE/BANDAGES/DRESSINGS) IMPLANT
SUCTION FRAZIER TIP 10 FR DISP (SUCTIONS) ×2 IMPLANT
SUT ETHIBOND NAB CT1 #1 30IN (SUTURE) IMPLANT
SUT MNCRL AB 4-0 PS2 18 (SUTURE) IMPLANT
SUT VIC AB 1 CT1 36 (SUTURE) ×6 IMPLANT
SUT VIC AB 2-0 CT1 27 (SUTURE) ×6
SUT VIC AB 2-0 CT1 TAPERPNT 27 (SUTURE) ×3 IMPLANT
SUT WIRE 16GA (Orthopedic Implant) ×2 IMPLANT
TOWEL OR 17X26 10 PK STRL BLUE (TOWEL DISPOSABLE) ×5 IMPLANT
TRAY FOLEY CATH 14FRSI W/METER (CATHETERS) ×1 IMPLANT
WATER STERILE IRR 1500ML POUR (IV SOLUTION) ×3 IMPLANT

## 2012-12-03 NOTE — Progress Notes (Signed)
FL2 in chart for MD signature. CSW met with pt / spoke to pt's son to assist with d/c planning. Pt plans to have ST rehab at [facility] following hospital d/c. SNF confirmed d/c plan. CSW will follow to assist with d/c planning to SNF.  Cori Razor LCSW [phone]

## 2012-12-03 NOTE — Progress Notes (Signed)
Patient ID: [name], female   DOB: 1932/01/30, 78 y.o.   MRN: [id number]  Left peri-prosthetic femur fracture  NPO Consent order in  Planning to take to OR today for ORIF/revision of her left peri-prosthetic femur fracture Reviewed with patient, she is ready  Reports having been doing very well prior to fall

## 2012-12-03 NOTE — Brief Op Note (Signed)
12/01/2012 - 12/03/2012  8:24 PM  PATIENT:  [name]  78 y.o. female  PRE-OPERATIVE DIAGNOSIS:  left periprosthetic femur fracture   POST-OPERATIVE DIAGNOSIS:  left periprosthetic femur fracture with stable hemiarthroplasty prosthesis  PROCEDURE:  Procedure(s) (LRB) with comments: OPEN REDUCTION INTERNAL FIXATION (ORIF) PERIPROSTHETIC FRACTURE (Left)  Zimmer - 8 hole plate with cables and screws  SURGEON:  Surgeon(s) and Role:    * Shelda Pal, MD - Primary  PHYSICIAN ASSISTANT: Lanney Gins, PA-C  ANESTHESIA:   general  EBL:  Total I/O In: 1200 [I.V.:1200] Out: 325 [Urine:200; Blood:125]  BLOOD ADMINISTERED:none  DRAINS: none   LOCAL MEDICATIONS USED:  NONE  SPECIMEN:  No Specimen  DISPOSITION OF SPECIMEN:  N/A  COUNTS:  YES  TOURNIQUET:  * No tourniquets in log *  DICTATION: .Other Dictation: Dictation Number [id number]  PLAN OF CARE: Admit to inpatient   PATIENT DISPOSITION:  PACU - hemodynamically stable.   Delay start of Pharmacological VTE agent (>24hrs) due to surgical blood loss or risk of bleeding: no

## 2012-12-03 NOTE — Progress Notes (Signed)
Utilization review completed.  

## 2012-12-03 NOTE — Progress Notes (Signed)
Patient c/o midsternal chest pain radiating down left arm. Bp 183/85. Pulse 70. Pulse ox 100% with O2 @2l /[place]. Ekg obtained. Dr. Irene Limbo on floor & made aware of patient's c/o chest pain. Follow-up with rapid response nurse for assessment to r/o cardiac involvement. Dilaudid 0.5mg  given IV x1 to relieve chest pain. Dr. Irene Limbo in to see patient & explained patient fell on lt chest & is painful to touch & activity. Bp decrease to 175/80 to giving of pain med.

## 2012-12-03 NOTE — Progress Notes (Signed)
[facility] PROGRESS NOTE  [name] ZOX:[id number] DOB: 1932/11/07 DOA: 12/01/2012 PCP: [name], MD Pulmonologist: Sandrea Hughs, MD  Assessment/Plan: 1. Periprosthetic femur fracture s/p mechanical fall--per orthopedics. S/p hip fracture and surgery 10/23. For surgery 12/16 per Dr. Charlann Boxer.  2. Atypical chest pain--chest wall/skeletal pain on exam with history of trauma (and also pain same area secondary to walker use from prior fracture). EKG SR with LVH, repol abnormality, seen also 12/14, no significant change. Check xray to rule out rib fracture. 3. Uncontrolled HTN--secondary to pain. Continue home medications, control pain and spasms. 4. COPD--stable, continue Spiriva, Symbicort. 5. Anxiety--stable. Continue Wellbutrin. 6. Chronic back pain--continue Fentanyl patch, Lortab PRN.  Code Status: DNR Family Communication: discussed with son by telephone Disposition Plan: likely SNF  Brendia Sacks, MD  [facility] Team 6 Pager [phone] If 8PM-8AM, please contact night-coverage at [email or web address], password [facility] 12/03/2012, 3:06 PM  LOS: 2 days   Brief narrative: 78 year old woman fell while carrying groceries sustaining periprosthetic femur fracture.  Consultants:  Orthopedics  Procedures:    HPI/Subjective: Overall better, pain controlled as long as she doesn't move. Complained this morning of left chest wall pain. Pain constant since fall--reports falling forward onto her chest (when fractured hip) and has had pain since that time (thus preceding admission). Reports exquisite chest wall pain. No history of CAD. Later today pain radiated down left arm.  Objective: Filed Vitals:   12/02/12 2205 12/03/12 0359 12/03/12 1013 12/03/12 1330  BP: 160/81 157/86 158/82 183/85  Pulse: 72 66 84 68  Temp: 97.9 F (36.6 C) 98 F (36.7 C)  98.6 F (37 C)  TempSrc: Oral Oral    Resp: 18 20  18   Height:      Weight:      SpO2: 96% 99% 98% 99%     Intake/Output Summary (Last 24 hours) at 12/03/12 1506 Last data filed at 12/03/12 1400  Gross per 24 hour  Intake 601.25 ml  Output   1000 ml  Net -398.75 ml   Filed Weights   12/01/12 1226  Weight: 35.834 kg (79 lb)    Exam:  General:  Appears calm and comfortable Cardiovascular: RRR, no m/r/g. No LE edema. Chest wall: exquisite pain over left chest fall mid to lateral clavicle, winces with lightest touch to this area. No gross deformity. Respiratory: CTA bilaterally, no w/r/r. Normal respiratory effort. Psychiatric: grossly normal mood and affect, speech fluent and appropriate  Data Reviewed: Basic Metabolic Panel:  Lab 12/03/12 8295 12/02/12 0506 12/01/12 1342  NA -- 135 136  K 4.3 3.3* 3.7  CL -- 99 99  CO2 -- 23 25  GLUCOSE -- 99 141*  BUN -- 12 19  CREATININE -- 0.46* 0.59  CALCIUM -- 9.1 9.3  MG -- -- --  PHOS -- -- --   Liver Function Tests:  Lab 12/02/12 0506  AST 11  ALT 7  ALKPHOS 110  BILITOT 0.2*  PROT 6.6  ALBUMIN 2.9*   CBC:  Lab 12/02/12 0506 12/01/12 1342  WBC 14.9* 7.0  NEUTROABS 11.0* 5.9  HGB 13.9 14.8  HCT 41.6 44.1  MCV 93.7 93.2  PLT 477* 459*    Recent Results (from the past 240 hour(s))  SURGICAL PCR SCREEN     Status: Normal   Collection Time   12/01/12  5:06 PM      Component Value Range Status Comment   MRSA, PCR NEGATIVE  NEGATIVE Final    Staphylococcus aureus NEGATIVE  NEGATIVE Final  Studies: No results found.  Scheduled Meds:    . amLODipine  10 mg Oral Daily  . bisoprolol  7.5 mg Oral Daily  . budesonide-formoterol  2 puff Inhalation BID  . buPROPion  300 mg Oral Daily  . fentaNYL  50 mcg Transdermal Q72H  . levothyroxine  25 mcg Oral Q0600  . methocarbamol (ROBAXIN) IV  500 mg Intravenous Q6H  . predniSONE  10 mg Oral Daily  . tiotropium  18 mcg Inhalation Daily   Continuous Infusions:    . sodium chloride 75 mL/hr at 12/03/12 1400    Active Problems:  * No active hospital problems. Brendia Sacks, MD  [facility] Team 6 Pager [phone] If 8PM-8AM, please contact night-coverage at [email or web address], password [facility] 12/03/2012, 3:06 PM  LOS: 2 days   Time spent: 25 minutes

## 2012-12-03 NOTE — Progress Notes (Signed)
Brief Pharmacy Note: Lovenox for VTE ppx  S/O: Pt on Lovenox 30mg  sq q24h. Went to OR this AM and MD is requesting resume Lovenox. Last Lovenox dose 12/15 ~2200. Wt 35.8kg, CrCl 51ml/min.  A/P: Resume Lovenox 30mg  q24h - first dose tomorrow 1200 (~16 h post op). Monitor for s/sx bleeding.   Gwen Her PharmD  [phone] 12/03/2012 9:15 PM

## 2012-12-03 NOTE — Progress Notes (Signed)
INITIAL NUTRITION ASSESSMENT  DOCUMENTATION CODES Per approved criteria  -Underweight   INTERVENTION: 1. Modify diet; to Regular once medically appropriate. 2.  Supplements; encouraged pt to continue [employer] as inpatient. 3.  Nutrition-related medication; pt has not had a BM since Friday- monitor for need for bowel regimen post-op.  NUTRITION DIAGNOSIS: Unintended wt loss related to unknown etiology as evidenced by pt with persist loss and inability to gain wt.   Monitor:  1.  Food/Beverage; pt consuming >/=50% of meals with tolerance to meet >/=90% estimated needs.  Reason for Assessment: consult; assessment of needs  78 y.o. female  Admitting Dx: hip fx  ASSESSMENT: Pt admitted s/p fall with hip fx awaiting surgical repair.   Pt reports hunger- has been NPO all day.  RD consulted for assessment of nutrition status/needs.  Pt with 2 recent assessments by clinical nutrition staff with reported excellent intake of a high calorie, high protein diet.  This is confirmed by family at bedside who states pt was cooking for the both of them and he experienced wt gain while pt was unable to maintain.  Pt with recent dx of c. Diff which may have contributed to poor absorption and inability to gain wt.  Family and pt state that PCP is aware of wt change and has sent pt for further GI testing- all of which have come back normal.   Height: Ht Readings from Last 1 Encounters:  12/01/12 5' (1.524 m)    Weight: Wt Readings from Last 1 Encounters:  12/01/12 79 lb (35.834 kg)    Ideal Body Weight: 50 kg  % Ideal Body Weight: 71%  Wt Readings from Last 10 Encounters:  12/01/12 79 lb (35.834 kg)  12/01/12 79 lb (35.834 kg)  11/30/12 79 lb (35.834 kg)  10/07/12 88 lb (39.917 kg)  10/07/12 88 lb (39.917 kg)  10/01/12 89 lb (40.37 kg)  02/03/12 88 lb 14.4 oz (40.325 kg)  08/24/10 90 lb 12.8 oz (41.187 kg)  08/03/10 92 lb 6.4 oz (41.912 kg)  04/27/10 93 lb (42.185 kg)    Usual Body Weight: 96 lbs per family  % Usual Body Weight: 82%  BMI:  Body mass index is 15.43 kg/(m^2).  Estimated Nutritional Needs: Kcal: 1400-1600  Protein: 60-80g Fluid: 1.4-1.5 L/day  Skin: Intact  Diet Order: NPO  EDUCATION NEEDS: -Education needs addressed   Intake/Output Summary (Last 24 hours) at 12/03/12 1732 Last data filed at 12/03/12 1700  Gross per 24 hour  Intake 866.25 ml  Output   1350 ml  Net -483.75 ml    Last BM: 12/13   Labs:   Lab 12/03/12 0410 12/02/12 0506 12/01/12 1342  NA -- 135 136  K 4.3 3.3* 3.7  CL -- 99 99  CO2 -- 23 25  BUN -- 12 19  CREATININE -- 0.46* 0.59  CALCIUM -- 9.1 9.3  MG -- -- --  PHOS -- -- --  GLUCOSE -- 99 141*    CBG (last 3)  No results found for this basename: GLUCAP:3 in the last 72 hours  Scheduled Meds:   . amLODipine  10 mg Oral Daily  . bisoprolol  7.5 mg Oral Daily  . budesonide-formoterol  2 puff Inhalation BID  . buPROPion  300 mg Oral Daily  . fentaNYL  50 mcg Transdermal Q72H  . levothyroxine  25 mcg Oral Q0600  . methocarbamol (ROBAXIN) IV  500 mg Intravenous Q6H  . predniSONE  10 mg Oral Daily  . tiotropium  18 mcg Inhalation Daily    Continuous Infusions:   . sodium chloride 75 mL/hr at 12/03/12 1700    Past Medical History  Diagnosis Date  . lung ca dx'd 08/2010    xrt comp 10/2010  . Hypertension   . Lung cancer     LUL, LLL lung  . Osteoporosis   . Diverticulitis   . Chronic back pain     Past Surgical History  Procedure Date  . Hip arthroplasty 10/10/2012    Procedure: ARTHROPLASTY BIPOLAR HIP;  Surgeon: Shelda Pal, MD;  Location: [facility] ORS;  Service: Orthopedics;  Laterality: Left;      Loyce Dys, MS RD LDN Clinical Inpatient Dietitian Pager: [phone] Weekend/After hours pager: [phone]

## 2012-12-03 NOTE — Anesthesia Preprocedure Evaluation (Addendum)
Anesthesia Evaluation  Patient identified by MRN, date of birth, ID band Patient awake    Reviewed: Allergy & Precautions, H&P , NPO status , Patient's Chart, lab work & pertinent test results  Airway Mallampati: II TM Distance: >3 FB Neck ROM: Full    Dental  (+) Edentulous Upper and Edentulous Lower   Pulmonary shortness of breath and with exertion, asthma , pneumonia -, COPD COPD inhaler, Current Smoker, former smoker,  Hx of lung CA breath sounds clear to auscultation- rhonchi  Pulmonary exam normal + decreased breath sounds      Cardiovascular hypertension, Pt. on medications and Pt. on home beta blockers Rhythm:Regular Rate:Normal     Neuro/Psych Depression Chronic low back pain negative neurological ROS     GI/Hepatic negative GI ROS, Neg liver ROS,   Endo/Other  Hypothyroidism   Renal/GU negative Renal ROS  negative genitourinary   Musculoskeletal negative musculoskeletal ROS (+)   Abdominal (+) + scaphoid   Peds negative pediatric ROS (+)  Hematology negative hematology ROS (+)   Anesthesia Other Findings   Reproductive/Obstetrics negative OB ROS                          Anesthesia Physical Anesthesia Plan  ASA: III  Anesthesia Plan: General   Post-op Pain Management:    Induction: Intravenous  Airway Management Planned: Oral ETT  Additional Equipment:   Intra-op Plan:   Post-operative Plan: Extubation in OR  Informed Consent: I have reviewed the patients History and Physical, chart, labs and discussed the procedure including the risks, benefits and alternatives for the proposed anesthesia with the patient or authorized representative who has indicated his/her understanding and acceptance.   Dental advisory given  Plan Discussed with: CRNA  Anesthesia Plan Comments:         Anesthesia Quick Evaluation

## 2012-12-03 NOTE — Transfer of Care (Signed)
Immediate Anesthesia Transfer of Care Note  Patient: [name]  Procedure(s) Performed: Procedure(s) (LRB) with comments: OPEN REDUCTION INTERNAL FIXATION (ORIF) PERIPROSTHETIC FRACTURE (Left)  Patient Location: PACU  Anesthesia Type:General  Level of Consciousness: awake, alert , oriented and patient cooperative  Airway & Oxygen Therapy: Patient Spontanous Breathing and Patient connected to face mask oxygen  Post-op Assessment: Report given to PACU RN and Post -op Vital signs reviewed and stable  Post vital signs: Reviewed and stable  Complications: No apparent anesthesia complications

## 2012-12-03 NOTE — Anesthesia Postprocedure Evaluation (Signed)
Anesthesia Post Note  Patient: [name]  Procedure(s) Performed: Procedure(s) (LRB): OPEN REDUCTION INTERNAL FIXATION (ORIF) PERIPROSTHETIC FRACTURE (Left)  Anesthesia type: General  Patient location: PACU  Post pain: Pain level controlled  Post assessment: Post-op Vital signs reviewed  Last Vitals:  Filed Vitals:   12/03/12 2109  BP: 178/78  Pulse:   Temp:   Resp:     Post vital signs: Reviewed  Level of consciousness: sedated  Complications: No apparent anesthesia complications

## 2012-12-03 NOTE — Progress Notes (Addendum)
RRT called for chest pain. Pt supine in bed with c/o sharp  Pain in chest, grimaced and said ouch when electrode applied for telemetry monitoring. This pain felt like the previous pain on admission. (Pt had a frontward fall at home landing on her chest and fracturing her hip).  Pain was radiating down right arm but is now gone. Dr Irene Limbo aware. EKG done. ST elevation noted. Dr Irene Limbo in room. Bedside RN gave IV pain med for pain. RRT call complete. VS 175/80, SR 84, RR20, sats 99% on [place] 2L.

## 2012-12-04 LAB — BASIC METABOLIC PANEL
BUN: 13 mg/dL (ref 6–23)
Calcium: 8.6 mg/dL (ref 8.4–10.5)
GFR calc non Af Amer: >90 mL/min (ref >90)
Glucose, Bld: 84 mg/dL (ref 70–99)
Sodium: 134 mEq/L — ABNORMAL LOW (ref 135–145)

## 2012-12-04 LAB — CBC
Hemoglobin: 12.7 g/dL (ref 12.0–15.0)
MCH: 30.3 pg (ref 26.0–34.0)
MCHC: 32.3 g/dL (ref 30.0–36.0)
RDW: 14.2 % (ref 11.5–15.5)

## 2012-12-04 MED ORDER — HYDROCODONE-ACETAMINOPHEN 5-325 MG PO TABS: 1.0000 | ORAL_TABLET | ORAL | Status: DC | PRN

## 2012-12-04 MED ORDER — HYDROCODONE-ACETAMINOPHEN 5-325 MG PO TABS: 0.5000 | ORAL_TABLET | ORAL | Status: DC | PRN

## 2012-12-04 MED ORDER — HYDROCODONE-ACETAMINOPHEN 5-325 MG PO TABS
1.0000 | ORAL_TABLET | ORAL | Status: DC | PRN
  Administered 2012-12-04 – 2012-12-05 (×3): 2 via ORAL
  Administered 2012-12-05: 1 via ORAL
  Administered 2012-12-05: 2 via ORAL
  Filled 2012-12-04 (×5): qty 2

## 2012-12-04 NOTE — Progress Notes (Signed)
Physical Therapy Treatment Patient Details Name: [name] MRN: [id number] DOB: [birth year] Today's Date: 12/04/2012 Time: 7829-5621 PT Time Calculation (min): 16 min  PT Assessment / Plan / Recommendation Comments on Treatment Session       Follow Up Recommendations  SNF     Does the patient have the potential to tolerate intense rehabilitation     Barriers to Discharge        Equipment Recommendations  None recommended by PT    Recommendations for Other Services OT consult  Frequency Min 6X/week   Plan Discharge plan remains appropriate    Precautions / Restrictions Precautions Precautions: Fall Restrictions Weight Bearing Restrictions: Yes LLE Weight Bearing: Partial weight bearing LLE Partial Weight Bearing Percentage or Pounds: 50   Pertinent Vitals/Pain 7/10; pain meds requested, ice pack provided    Mobility  Bed Mobility Bed Mobility: Sit to Supine Sit to Supine: 1: +2 Total assist Sit to Supine: Patient Percentage: 50% Details for Bed Mobility Assistance: cues for sequence use of L LE to self assist Transfers Transfers: Sit to Stand;Stand to Sit Sit to Stand: 1: +2 Total assist Sit to Stand: Patient Percentage: 50% Stand to Sit: 1: +2 Total assist Stand to Sit: Patient Percentage: 50% Details for Transfer Assistance: cues for LE management and use of UEs to self assist Ambulation/Gait Ambulation/Gait Assistance: 1: +2 Total assist Ambulation/Gait: Patient Percentage: 50% Ambulation Distance (Feet): 6 Feet Assistive device: Rolling walker Ambulation/Gait Assistance Details: cues for sequence, posture, increased UE WB and position from RW Gait Pattern: Step-to pattern    Exercises     PT Diagnosis:    PT Problem List:   PT Treatment Interventions:     PT Goals Acute Rehab PT Goals PT Goal Formulation: With patient Time For Goal Achievement: 12/08/12 Potential to Achieve Goals: Good Pt will go Supine/Side to Sit: with min assist PT  Goal: Supine/Side to Sit - Progress: Progressing toward goal Pt will go Sit to Supine/Side: with min assist PT Goal: Sit to Supine/Side - Progress: Progressing toward goal Pt will go Sit to Stand: with min assist PT Goal: Sit to Stand - Progress: Progressing toward goal Pt will go Stand to Sit: with min assist PT Goal: Stand to Sit - Progress: Progressing toward goal Pt will Ambulate: 16 - 50 feet;with min assist;with rolling walker PT Goal: Ambulate - Progress: Progressing toward goal  Visit Information  Last PT Received On: 12/04/12 Assistance Needed: +2    Subjective Data  Subjective: I'm just so tired, I need to lay down Patient Stated Goal: Resume previous lifestyle   Cognition  Overall Cognitive Status: Appears within functional limits for tasks assessed/performed Arousal/Alertness: Awake/alert Orientation Level: Appears intact for tasks assessed Behavior During Session: [facility] for tasks performed    Balance     End of Session PT - End of Session Equipment Utilized During Treatment: Gait belt Activity Tolerance: Patient limited by pain;Patient limited by fatigue Patient left: in bed;with call bell/phone within reach Nurse Communication: Mobility status   GP     Irelyn Perfecto 12/04/2012, 3:55 PM
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
## 2012-12-04 NOTE — Op Note (Signed)
[name], [name] NO.:  [id number]  MEDICAL RECORD NO.:  [id number]  LOCATION:  1611                         FACILITY:  [facility]  PHYSICIAN:  Madlyn Frankel. Charlann Boxer, M.D.  DATE OF BIRTH:  09-19-1932  DATE OF PROCEDURE:  12/03/2012 DATE OF DISCHARGE:                              OPERATIVE REPORT   PREOPERATIVE DIAGNOSIS:  Comminuted left distal periprosthetic femur fracture with a clinically stable left hemiarthroplasty, femoral component.  POSTOPERATIVE DIAGNOSIS:  Comminuted left distal periprosthetic femur fracture with a clinically stable left hemiarthroplasty, femoral component.  PROCEDURE:  Open reduction and internal fixation of left comminuted distal periprosthetic femur fracture with stable femoral component utilizing an 8-hole Zimmer cable plate system with three cables placed proximal to the fracture segment, a 16-gauge wire used to basically cerclage the butterfly fragment in the midsection prior to fixation, two further cables distally combined with two bicortical screws distally.  SURGEON:  Madlyn Frankel. Charlann Boxer, M.D.  ASSISTANT:  Lanney Gins, PA-C.  Note that Mr. [name] was present for the entirety of the case from preoperative positioning, management of the operative extremity, assistance in reduction and maintaining reduction during the procedure, general facilitation of the case, and primary wound closure.  ANESTHESIA:  General.  SPECIMENS:  None.  COMPLICATION:  None.  BLOOD LOSS:  About 150 mL.  DRAINS:  None.  INDICATION FOR PROCEDURE:  [name] is a 78 year old patient of mine almost two months ago.  Now, [name] underwent a left hip hemiarthroplasty for femoral neck fracture.  [name] had to gone initially to a nursing facility and then had recently been discharged home.  [name] was functionally independent about when [name] had a second fall.  [name] was brought to the emergency room where radiographs revealed distal periprosthetic femur  fracture with obvious femoral deformity.  [name] was admitted to the Medical Service based on medical comorbidities and Orthopedics was consulted.  One of my partners was on-call and contacted me for assistance and definitive management.  I had a chance to review the sheet.  The necessity of the fracture and fixation was quite obvious based on deformity for potential bone healing.  The options were to fix the fracture and revise the femoral component based on the initial radiograph, concerns more of a transverse pattern.  Consent was obtained after talking to [name] and [name]. Consent was obtained for fracture management and healing.  PROCEDURE IN DETAIL:  The patient was brought to the operative theater. Once adequate anesthesia, preoperative antibiotics, Ancef administered. [name] was positioned into the right lateral decubitus position with left side up.  The left lower extremity was then prepped and draped in sterile fashion.  A time-out was performed identifying the patient, planned procedure, and extremity.  Priscilla old incision was identified and extended far distally.  The soft tissue planes were created and the iliotibial band was incised.  The gluteal fascia was also split with the intent of possibly needing to revise the femoral component.  At this point, the vastus lateralis was elevated off the posterior intermuscular septum and elevated anteriorly.  Fracture site was identified with significant damage to the muscle and the area obviously.  At this point, using Jackson clamps, the fracture  was reduced including the butterfly fragment.  With the fracture reduced and identifying that this was a larger oblique segment fractures with this oblique segment, I decided that the fracture reduction and internal fixation would be adequate for stabilization of Priscilla hip and the revisions would not be necessary.  I also made a decision based on reviewing Priscilla radiographs from Priscilla initial postop  films at the time of the fracture, indicating no evidence any subsidence or femoral component.  At this point, with the fracture reduced, I did use a 16-gauge wire to cerclage the butterfly fragment to the remaining portion of the femur and the prosthetic component.  I then selected an 8-hole Zimmer plate, that measured about 25 cm in length.  This was placed over the lateral side of Priscilla femur, and using [place] clamps, I reduced the femur to the plate.  I then placed three cables proximally, two cables distal to the fracture and this was supported with two further distal bicortical screws.  At this point, we had adequate fixation of the entire shaft of the femur with the plate attached to it was fully mobile as a unit.  The cables were crimped and cut.  The 16-gauge wire was cut and bent back down to the femur.  At this point, we irrigated this lateral thigh wound out.  I then reapproximated the vastus lateralis over top of the lateral aspect of the femur and then reapproximated the iliotibial band in its entirety using a combination of #1 Vicryl and 0 V-Loc sutures. The remainder of the wound was closed with 2-0 Vicryl and staples on the skin.  The skin was then cleaned, dried and dressed sterilely using Mepilex dressings.  No Hemovac used.  [name] was then brought to the recovery room and extubated in stable condition, tolerating the procedure well.  I will need to keep Priscilla partial weightbearing for till bone union, which could be anywhere from 6-10 weeks. [name] will be in the hospital for another 1-2 days before being transferred back to nursing facility for rehabilitation.  Findings were reviewed with [name].     Madlyn Frankel Charlann Boxer, M.D.     MDO/MEDQ  D:  12/03/2012  T:  12/04/2012  Job:  [id number]

## 2012-12-04 NOTE — Progress Notes (Signed)
[facility] PROGRESS NOTE  [name] YNW:[id number] DOB: [birth year] DOA: 12/01/2012 PCP: [name], MD Pulmonologist: Sandrea Hughs, MD  Assessment/Plan: 1. Periprosthetic femur fracture s/p mechanical fall--per orthopedics. S/p surgery 12/16 per Dr. Charlann Boxer. Increase Lortab for pain. DVT prophylaxis per orthopedics. 2. Leukocytosis--etiology unclear, no signs of infection. Seen on last hospitalization for hip fracture as well. Monitor. Afebrile. Avoid abx if possible given history of cdiff colitis. 3. Chest wall/skeletal pain--secondary to fall prior to admission.  4. Uncontrolled HTN--labile, secondary to pain. Continue home medications, control pain and spasms. 5. COPD--stable, continue Spiriva, Symbicort, prednisone. 6. Anxiety--stable. Continue Wellbutrin. 7. Chronic back pain--continue Fentanyl patch, Lortab PRN.  Code Status: DNR Family Communication: none present Disposition Plan: SNF  Brendia Sacks, MD  [facility] Team 6 Pager [phone] If 8PM-8AM, please contact night-coverage at [email or web address], password [facility] 12/04/2012, 2:36 PM  LOS: 3 days   Brief narrative: 78 year old woman fell while carrying groceries sustaining periprosthetic femur fracture.  Consultants:  Orthopedics  PT--SNF  OT--SNF  Procedures:  12/16 Open reduction and internal fixation of left comminuted distal periprosthetic femur fracture   HPI/Subjective: S/p surgery 12/16 without apparent complication. Rough night secondary to hip pain, no real relief with Dilaudid and oral narcotics. Chest wall still sore.  Objective: Filed Vitals:   12/04/12 0922 12/04/12 1115 12/04/12 1200 12/04/12 1344  BP:  161/78  134/70  Pulse:  92  81  Temp:  98.5 F (36.9 C)  98.5 F (36.9 C)  TempSrc:  Oral  Oral  Resp:  16 16 16   Height:      Weight:      SpO2: 100% 100% 100% 99%    Intake/Output Summary (Last 24 hours) at 12/04/12 1436 Last data filed at 12/04/12 1250  Gross  per 24 hour  Intake   2855 ml  Output   1900 ml  Net    955 ml   Filed Weights   12/01/12 1226  Weight: 35.834 kg (79 lb)    Exam:  General:  Appears calm and comfortable, sitting in chair Cardiovascular: RRR, no m/r/g. No LE edema. Respiratory: CTA bilaterally, no w/r/r. Normal respiratory effort. Psychiatric: grossly normal mood and affect, speech fluent and appropriate  Exam current 12/17  Data Reviewed: Basic Metabolic Panel:  Lab 12/04/12 6295 12/03/12 0410 12/02/12 0506 12/01/12 1342  NA 134* -- 135 136  K 3.7 4.3 3.3* 3.7  CL 99 -- 99 99  CO2 23 -- 23 25  GLUCOSE 84 -- 99 141*  BUN 13 -- 12 19  CREATININE 0.49* -- 0.46* 0.59  CALCIUM 8.6 -- 9.1 9.3  MG -- -- -- --  PHOS -- -- -- --   Liver Function Tests:  Lab 12/02/12 0506  AST 11  ALT 7  ALKPHOS 110  BILITOT 0.2*  PROT 6.6  ALBUMIN 2.9*   CBC:  Lab 12/04/12 0405 12/02/12 0506 12/01/12 1342  WBC 24.4* 14.9* 7.0  NEUTROABS -- 11.0* 5.9  HGB 12.7 13.9 14.8  HCT 39.3 41.6 44.1  MCV 93.8 93.7 93.2  PLT 453* 477* 459*    Recent Results (from the past 240 hour(s))  SURGICAL PCR SCREEN     Status: Normal   Collection Time   12/01/12  5:06 PM      Component Value Range Status Comment   MRSA, PCR NEGATIVE  NEGATIVE Final    Staphylococcus aureus NEGATIVE  NEGATIVE Final      Studies: No results found.  Scheduled Meds:    .  amLODipine  10 mg Oral Daily  . bisoprolol  7.5 mg Oral Daily  . budesonide-formoterol  2 puff Inhalation BID  . buPROPion  300 mg Oral Daily  . docusate sodium  100 mg Oral BID  . enoxaparin (LOVENOX) injection  30 mg Subcutaneous Q24H  . fentaNYL  50 mcg Transdermal Q72H  . ferrous sulfate  325 mg Oral TID PC  . levothyroxine  25 mcg Oral Q0600  . methocarbamol (ROBAXIN) IV  500 mg Intravenous Q6H  . predniSONE  10 mg Oral Daily  . tiotropium  18 mcg Inhalation Daily   Continuous Infusions:    Active Problems:  * No active hospital problems. Brendia Sacks, MD  [facility] Team 6 Pager [phone] If 8PM-8AM, please contact night-coverage at [email or web address], password [facility] 12/04/2012, 2:36 PM  LOS: 3 days   Time spent: 15 minutes

## 2012-12-04 NOTE — Progress Notes (Signed)
   Subjective: 1 Day Post-Op Procedure(s) (LRB): OPEN REDUCTION INTERNAL FIXATION (ORIF) PERIPROSTHETIC FRACTURE (Left)   Patient reports pain as moderate, she states that the current pain medication regimen is not helping her pain. States that she was up a lot of the night from pain. No other events throughout the night.   Objective:   VITALS:   Filed Vitals:   12/04/12  BP: 13166  Pulse: 81  Temp: 98.2 F (36.8 C)   Resp: 16    Neurovascular intact Dorsiflexion/Plantar flexion intact Incision: dressing C/D/I No cellulitis present Compartment soft  LABS  Basename 12/04/12 0405 12/02/12 0506 12/01/12 1342  HGB 12.7 13.9 14.8  HCT 39.3 41.6 44.1  WBC 24.4* 14.9* 7.0  PLT 453* 477* 459*     Basename 12/04/12 0405 12/03/12 0410 12/02/12 0506 12/01/12 1342  NA 134* -- 135 136  K 3.7 4.3 3.3* --  BUN 13 -- 12 19  CREATININE 0.49* -- 0.46* 0.59  GLUCOSE 84 -- 99 141*     Assessment/Plan: 1 Day Post-Op Procedure(s) (LRB): OPEN REDUCTION INTERNAL FIXATION (ORIF) PERIPROSTHETIC FRACTURE (Left) Changes Norco 5/325  from .5 of a tablet .5 to 2 of a tablet Up with therapy Discharge to SNF eventually when ready       Anastasio Auerbach. Torie Towle   PAC  12/04/2012, 8:30 AM

## 2012-12-05 ENCOUNTER — Encounter (HOSPITAL_COMMUNITY): Payer: Self-pay | Admitting: Orthopedic Surgery

## 2012-12-05 DIAGNOSIS — D500 Iron deficiency anemia secondary to blood loss (chronic): Secondary | ICD-10-CM

## 2012-12-05 LAB — URINALYSIS, ROUTINE W REFLEX MICROSCOPIC
Glucose, UA: NEGATIVE mg/dL
Hgb urine dipstick: NEGATIVE
Ketones, ur: NEGATIVE mg/dL
Protein, ur: 30 mg/dL — AB

## 2012-12-05 LAB — CBC
HCT: 34.1 % — ABNORMAL LOW (ref 36.0–46.0)
Hemoglobin: 11.1 g/dL — ABNORMAL LOW (ref 12.0–15.0)
MCH: 30.5 pg (ref 26.0–34.0)
MCHC: 32.6 g/dL (ref 30.0–36.0)

## 2012-12-05 LAB — URINE MICROSCOPIC-ADD ON

## 2012-12-05 MED ORDER — HYDROCODONE-ACETAMINOPHEN 5-325 MG PO TABS: 1.0000 | ORAL_TABLET | ORAL | Status: DC | PRN

## 2012-12-05 MED ORDER — LEVOFLOXACIN 500 MG PO TABS: 500.0000 mg | ORAL_TABLET | Freq: Every day | ORAL | Status: DC

## 2012-12-05 MED ORDER — BISACODYL 10 MG RE SUPP: 10.0000 mg | Freq: Every day | RECTAL | Status: DC | PRN

## 2012-12-05 MED ORDER — ENOXAPARIN SODIUM 30 MG/0.3ML ~~LOC~~ SOLN: 30.0000 mg | SUBCUTANEOUS | Status: DC

## 2012-12-05 MED ORDER — DSS 100 MG PO CAPS: 100.0000 mg | ORAL_CAPSULE | Freq: Two times a day (BID) | ORAL | Status: DC

## 2012-12-05 MED ORDER — FERROUS SULFATE 325 (65 FE) MG PO TABS: 325.0000 mg | ORAL_TABLET | Freq: Three times a day (TID) | ORAL | Status: DC

## 2012-12-05 MED ORDER — FENTANYL 50 MCG/HR TD PT72: 1.0000 | MEDICATED_PATCH | TRANSDERMAL | Status: DC

## 2012-12-05 NOTE — Care Management Note (Signed)
    Page 1 of 1   12/05/2012     6:29:54 PM   CARE MANAGEMENT NOTE 12/05/2012  Patient:  [name], [name]   Account Number:  [id number]  Date Initiated:  12/03/2012  Documentation initiated by:  Colleen Can  Subjective/Objective Assessment:   dx left prosthetic femoral fx, ORIF     Action/Plan:   Pt from ALF  Plan-? SNF   Anticipated DC Date:  12/05/2012   Anticipated DC Plan:  SKILLED NURSING FACILITY  In-house referral  Clinical Social Worker      DC Planning Services  CM consult      Choice offered to / List presented to:             Status of service:  Completed, signed off Medicare Important Message given?   (If response is "NO", the following Medicare IM given date fields will be blank) Date Medicare IM given:   Date Additional Medicare IM given:    Discharge Disposition:  SKILLED NURSING FACILITY  Per UR Regulation:    If discussed at Long Length of Stay Meetings, dates discussed:    Comments:

## 2012-12-05 NOTE — Progress Notes (Signed)
Report called to the [facility]. Pt is ready to be D/C'ed  at this time waiting on EMS.

## 2012-12-05 NOTE — Clinical Documentation Improvement (Signed)
Anemia Blood Loss Clarification  THIS DOCUMENT IS NOT A PERMANENT PART OF THE MEDICAL RECORD  RESPOND TO THE THIS QUERY, FOLLOW THE INSTRUCTIONS BELOW:  1. If needed, update documentation for the patient's encounter via the notes activity.  2. Access this query again and click edit on the In [employer].  3. After updating, or not, click F2 to complete all highlighted (required) fields concerning your review. Select "additional documentation in the medical record" OR "no additional documentation provided".  4. Click Sign note button.  5. The deficiency will fall out of your In Basket *Please let us know if you are not able to complete this workflow by phone or e-mail (listed below).        12/05/12  Dear Carmon Sails, Lorin Picket PA/Associates  In an effort to better capture your patient's severity of illness, reflect appropriate length of stay and utilization of resources, a review of the patient medical record has revealed the following indicators.    Based on your clinical judgment, please clarify and document in a progress note and/or discharge summary the clinical condition associated with the following supporting information:  In responding to this query please exercise your independent judgment.  The fact that a query is asked, does not imply that any particular answer is desired or expected.  Based on your clinical judgment can you provide a diagnosis that represents the below listed clinical indicators?  In this patient s/p ORIF left femoral fracture a review of the medical record reveals the following:   Pre Op H/H=14.8/44.1  Post Op H/H=11.1/34.1  EBL=125  Treatment:  Ferrous Sulfate  Clarification Needed:   Please clarify the underlying diagnosis responsible for abnormal post op H/H and document in pn or d/c summary.  Possible Clinical Conditions?   " Expected Acute Blood Loss Anemia  " Acute Blood Loss Anemia  " Acute on chronic blood loss anemia   " Other  Condition________________  " Cannot Clinically Determine  Risk Factors: (recent surgery, pre op anemia, EBL in OR)  Supporting Information:  Signs and Symptoms S/P ORIF left femoral fracture, Osteoporosis  Diagnostics: Prep OP: Component     Latest Ref Rng 12/01/2012          Hemoglobin     12.0 - 15.0 g/dL 16.1  HCT     09.6 - 04.5 % 44.1   Post Op Component     Latest Ref Rng 12/05/2012          Hemoglobin     12.0 - 15.0 g/dL 40.9 (L)  HCT     81.1 - 46.0 % 34.1 (L)   Treatments: Transfusion: Lab completed for Type and Crossed  Serial H&H monitoring Medications  Listed above  Reviewed: additional documentation in the medical record  Thank You,  Enis Slipper  RN, BSN, MSN/Inf, CCDS Clinical Documentation Specialist [facility] Dept Pager: [phone] / E-mail: [email or web address]  Health Information Management [place]

## 2012-12-05 NOTE — Progress Notes (Addendum)
   Subjective: 2 Days Post-Op Procedure(s) (LRB): OPEN REDUCTION INTERNAL FIXATION (ORIF) PERIPROSTHETIC FRACTURE (Left)   Patient reports pain as mild, pain under better control. No events throughout the night.  Objective:   VITALS:   Filed Vitals:   12/05/12   BP: 127/77  Pulse: 84  Temp: 98.2 F (36.8 C)   Resp: 18    Neurovascular intact Dorsiflexion/Plantar flexion intact Incision: dressing C/D/I No cellulitis present Compartment soft  LABS  Basename 12/05/12 0424 12/04/12 0405  HGB 11.1* 12.7  HCT 34.1* 39.3  WBC 27.3* 24.4*  PLT 448* 453*     Basename 12/04/12 0405 12/03/12 0410  NA 134* --  K 3.7 4.3  BUN 13 --  CREATININE 0.49* --  GLUCOSE 84 --     Assessment/Plan: 2 Days Post-Op Procedure(s) (LRB): OPEN REDUCTION INTERNAL FIXATION (ORIF) PERIPROSTHETIC FRACTURE (Left) Up with therapy Discharge to SNF when ready medically ready Orthopaedically stable Lovenox for 2 weeks, Rx on chart Norco for pain, Rx on chart MiraLax and Colace for constipation Follow up in 2 weeks at [facility]. Follow up with OLIN,Nicholas Trompeter D in 2 weeks.  Contact information:  [facility] [address], [address] [place] [address] [phone]    Expected ABLA  Treated with iron and will observe      Anastasio Auerbach. Passion Lavin   PAC  12/05/2012, 9:51 AM

## 2012-12-05 NOTE — Progress Notes (Signed)
CSW following to assist with d/c planning to SNF. [facility] has a SNF bed for this pt when stable for d/c. CSW will continue to follow to assist with d/c planning back to [place].  Cori Razor LCSW [phone]

## 2012-12-05 NOTE — Progress Notes (Signed)
Pt to be d/c to [facility] this afternoon. [facility] transport has been arranged and expected to be here prior to 5pm. Pt/son aware of d/c plan.  Cori Razor LCSW [phone]

## 2012-12-05 NOTE — Progress Notes (Signed)
Physical Therapy Treatment Patient Details Name: ADELLYN CAPEK MRN: [id number] DOB: Nov 17, 1932 Today's Date: 12/05/2012 Time: 1211-1230 PT Time Calculation (min): 19 min  PT Assessment / Plan / Recommendation Comments on Treatment Session       Follow Up Recommendations        Does the patient have the potential to tolerate intense rehabilitation     Barriers to Discharge        Equipment Recommendations       Recommendations for Other Services    Frequency     Plan      Precautions / Restrictions Precautions Precautions: Fall Restrictions Weight Bearing Restrictions: Yes LLE Weight Bearing: Partial weight bearing LLE Partial Weight Bearing Percentage or Pounds: 50   Pertinent Vitals/Pain 4/10    Mobility       Exercises Total Joint Exercises Ankle Circles/Pumps: AROM;Both;10 reps;Supine Quad Sets: AROM;10 reps;Both;Supine Gluteal Sets: AROM;10 reps;Both;Supine Heel Slides: AAROM;Left;15 reps;Supine Hip ABduction/ADduction: AAROM;Left;Supine;15 reps   PT Diagnosis:    PT Problem List:   PT Treatment Interventions:     PT Goals    Visit Information  Last PT Received On: 12/05/12 Assistance Needed: +1    Subjective Data  Subjective: I think I'm leaving today Patient Stated Goal: Resume previous lifestyle   Cognition  Overall Cognitive Status: Appears within functional limits for tasks assessed/performed Arousal/Alertness: Awake/alert Orientation Level: Appears intact for tasks assessed Behavior During Session: [facility] for tasks performed    Balance     End of Session     GP     Nashawn Hillock 12/05/2012, 2:55 PM

## 2012-12-05 NOTE — Discharge Summary (Addendum)
Physician Discharge Summary  [name] ZOX:[id number] DOB: [birth year] DOA: 12/01/2012  PCP: [name], MD  Admit date: 12/01/2012 Discharge date: 12/05/2012  Time spent: 40 minutes  Recommendations for Outpatient Follow-up:  1. Follow up with SNF MD.  2. Follow up with Dr Charlann Boxer, orthopedic surgeon, in 2 weeks.   Discharge Diagnoses:  Active Problems:  Expected blood loss anemia   Discharge Condition: Satisfactory.   Diet recommendation: Heart Healthy.   Filed Weights   12/01/12 1226  Weight: 35.834 kg (79 lb)    History of present illness:  Patient with known history of HTN, osteoporosis, chronic low back pain, previous lung cancer, presents via EMS after a fall while carrying groceries. She is s/p had recent left hip hemiarthroplasty by Dr Charlann Boxer in 09/2012. ED eval showed fracture of left hip. She is in lots of pain. No    Hospital Course: 1. Periprosthetic femur fracture: Patient presented following a mechanical fall, and imaging studies demonstrated an acute femoral fracture at the tip of the prosthetic with 3.5 cm override. Dr Durene Romans provided orthopedic consultation, and patient underwent ORIF on 12/03/12. Post-operative condition remained satisfactory. As of 12/05/12, orthopedic team has cleared patient for discharge, from this view [point.  2. Chest wall/skeletal pain: This was secondary to fall prior to admission and did not prove problematic, during patient's hospitalization.  3. Uncontrolled HTN: Patient's blood pressure was initially labile, secondary to pain. She was managed with pre-admission antihypertensives, pain was adequately controlled, and response was satisfactory. As of 12/05/12, BP has normalized. control pain and spasms. 4. COPD: Stable, on Spiriva, Symbicort, prednisone. 5. Anxiety: Stable/Not problematic. Continued on Wellbutrin. 6. Chronic back pain: Managed with Fentanyl patch, Lortab PRN, with good effect.  7. Leukocytosis: Patient  has had persistent leukocytosis, since 12/02/12. No obvious focus of infection, and patient has remained afebrile. Have discontinued Foley catheter on 12/05/12, but urine looks a bit "clody", so have sent off U/A and will empirically treat with a 3-day course of Levaquin. Given history of C. Difficile colitis, a longer course of antibiotics is undesirable.   Procedures: See Below.   Consultations:  Dr Durene Romans, orthopedic surgeon.   Discharge Exam: Filed Vitals:   12/05/12 0424 12/05/12 0800 12/05/12 0915 12/05/12 1142  BP: 127/77     Pulse: 84     Temp: 98.2 F (36.8 C)     TempSrc: Oral     Resp: 14 18  16   Height:      Weight:      SpO2: 99%  100% 100%   General: Comfortable, alert, communicative, fully oriented, not short of breath at rest.  HEENT:  No clinical pallor, no jaundice, no conjunctival injection or discharge. Hydration is satisfactory.  NECK:  Supple, JVP not seen, no carotid bruits, no palpable lymphadenopathy, no palpable goiter. CHEST:  Clinically clear to auscultation, no wheezes, no crackles. HEART:  Sounds 1 and 2 heard, normal, regular, no murmurs. ABDOMEN:  Full, soft, non-tender, no palpable organomegaly, no palpable masses, normal bowel sounds. GENITALIA:  Not examined. LOWER EXTREMITIES:  No pitting edema, palpable peripheral pulses. MUSCULOSKELETAL SYSTEM:  Surgical site left leg is clean and dry. CENTRAL NERVOUS SYSTEM:  No focal neurologic deficit on gross examination.   Discharge Instructions      Discharge Orders    Future Orders Please Complete By Expires   Diet - low sodium heart healthy      Diet - low sodium heart healthy      Call  MD / Call 911      Comments:   If you experience chest pain or shortness of breath, CALL 911 and be transported to the hospital emergency room.  If you develope a fever above 101 F, pus (white drainage) or increased drainage or redness at the wound, or calf pain, call your surgeon's office.   Discharge  instructions      Comments:   Maintain surgical dressing for 10-14 days, then replace with gauze and tape. Keep the area dry and clean until follow up. Follow up in 2 weeks at [facility]. Call with any questions or concerns.   Constipation Prevention      Comments:   Drink plenty of fluids.  Prune juice may be helpful.  You may use a stool softener, such as Colace (over the counter) 100 mg twice a day.  Use MiraLax (over the counter) for constipation as needed.   Increase activity slowly as tolerated      Increase activity slowly          Medication List     As of 12/05/2012  1:36 PM    STOP taking these medications         predniSONE 10 MG tablet   Commonly known as: DELTASONE      TAKE these medications         albuterol 108 (90 BASE) MCG/ACT inhaler   Commonly known as: PROVENTIL HFA;VENTOLIN HFA   Inhale 2 puffs into the lungs every 6 (six) hours as needed. For wheezing      amLODipine 10 MG tablet   Commonly known as: NORVASC   Take 10 mg by mouth daily.      bisacodyl 10 MG suppository   Commonly known as: DULCOLAX   Place 1 suppository (10 mg total) rectally daily as needed.      bisoprolol 5 MG tablet   Commonly known as: ZEBETA   Take 7.5 mg by mouth daily. Pt takes 1.5 tablet      budesonide-formoterol 160-4.5 MCG/ACT inhaler   Commonly known as: SYMBICORT   Inhale 2 puffs into the lungs 2 (two) times daily.      buPROPion 300 MG 24 hr tablet   Commonly known as: WELLBUTRIN XL   Take 300 mg by mouth daily.      DSS 100 MG Caps   Take 100 mg by mouth 2 (two) times daily.      enoxaparin 30 MG/0.3ML injection   Commonly known as: LOVENOX   Inject 0.3 mLs (30 mg total) into the skin daily.      fentaNYL 50 MCG/HR   Commonly known as: DURAGESIC - dosed mcg/hr   Place 1 patch (50 mcg total) onto the skin every 3 (three) days.      ferrous sulfate 325 (65 FE) MG tablet   Take 1 tablet (325 mg total) by mouth 3 (three) times daily after  meals.      HYDROcodone-acetaminophen 5-325 MG per tablet   Commonly known as: NORCO/VICODIN   Take 1-2 tablets by mouth every 4 (four) hours as needed for pain.      levofloxacin 500 MG tablet   Commonly known as: LEVAQUIN   Take 1 tablet (500 mg total) by mouth daily.      levothyroxine 25 MCG tablet   Commonly known as: SYNTHROID, LEVOTHROID   Take 25 mcg by mouth daily.      ondansetron 4 MG tablet   Commonly known as: ZOFRAN   Take  1 tablet (4 mg total) by mouth every 6 (six) hours as needed for nausea.      tiotropium 18 MCG inhalation capsule   Commonly known as: SPIRIVA   Place 1 capsule (18 mcg total) into inhaler and inhale daily.         Follow-up Information    Follow up with Shelda Pal, MD. Schedule an appointment as soon as possible for a visit in 2 weeks.   Contact information:   [address] [place] [address] [phone]       Follow up with [name], MD. (As needed)    Contact information:   [address] [place] [address] [phone]           The results of significant diagnostics from this hospitalization (including imaging, microbiology, ancillary and laboratory) are listed below for reference.    Significant Diagnostic Studies: Dg Chest 1 View  12/01/2012  *RADIOLOGY REPORT*  Clinical Data: Fall.  Shortness of breath.  COPD.  CHEST - 1 VIEW  Comparison: 11/26/2012  Findings: Heart size is normal.  Patient is partially rotated to the left.  Both lungs are clear.  No evidence of pneumothorax or hemothorax.  No evidence of mediastinal widening.  Left shoulder prosthesis again noted.  IMPRESSION: No acute findings.   Original Report Authenticated By: Myles Rosenthal, M.D.    Dg Hip Complete Left  12/01/2012  *RADIOLOGY REPORT*  Clinical Data: Recent left hip arthroplasty.  The  LEFT HIP - COMPLETE 2+ VIEW  Comparison: Plain films 10/07/2012  Findings: There is a fracture of the proximal femoral diaphysis at the level of the tip of  the femoral prosthetic with 3.5  cm override.  The hip is located.  IMPRESSION:  Acute femoral fracture at the tip of the prosthetic with 3.5 cm override.   Original Report Authenticated By: Genevive Bi, M.D.    Dg Femur Left  12/01/2012  *RADIOLOGY REPORT*  Clinical Data: Recent left hip replacement  LEFT FEMUR - 2 VIEW  Comparison: None.  Findings: There is a fracture of the midshaft left femur at the tip of the femoral prosthetic.  There is approximately 3.5 cm of override with lateral displacement.  No evidence of distal femur fracture.  There is a total left knee arthroplasty which is intact.  IMPRESSION: Midshaft left femur fracture.   Original Report Authenticated By: Genevive Bi, M.D.     Microbiology: Recent Results (from the past 240 hour(s))  SURGICAL PCR SCREEN     Status: Normal   Collection Time   12/01/12  5:06 PM      Component Value Range Status Comment   MRSA, PCR NEGATIVE  NEGATIVE Final    Staphylococcus aureus NEGATIVE  NEGATIVE Final      Labs: Basic Metabolic Panel:  Lab 12/04/12 5784 12/03/12 0410 12/02/12 0506 12/01/12 1342  NA 134* -- 135 136  K 3.7 4.3 3.3* 3.7  CL 99 -- 99 99  CO2 23 -- 23 25  GLUCOSE 84 -- 99 141*  BUN 13 -- 12 19  CREATININE 0.49* -- 0.46* 0.59  CALCIUM 8.6 -- 9.1 9.3  MG -- -- -- --  PHOS -- -- -- --   Liver Function Tests:  Lab 12/02/12 0506  AST 11  ALT 7  ALKPHOS 110  BILITOT 0.2*  PROT 6.6  ALBUMIN 2.9*   No results found for this basename: LIPASE:5,AMYLASE:5 in the last 168 hours No results found for this basename: AMMONIA:5 in the  last 168 hours CBC:  Lab 12/05/12 0424 12/04/12 0405 12/02/12 0506 12/01/12 1342  WBC 27.3* 24.4* 14.9* 7.0  NEUTROABS -- -- 11.0* 5.9  HGB 11.1* 12.7 13.9 14.8  HCT 34.1* 39.3 41.6 44.1  MCV 93.7 93.8 93.7 93.2  PLT 448* 453* 477* 459*   Cardiac Enzymes: No results found for this basename: CKTOTAL:5,CKMB:5,CKMBINDEX:5,TROPONINI:5 in the last 168 hours BNP: BNP (last 3  results) No results found for this basename: PROBNP:3 in the last 8760 hours CBG: No results found for this basename: GLUCAP:5 in the last 168 hours     Signed:  Arkie Tagliaferro,CHRISTOPHER  [facility] 12/05/2012, 1:36 PM

## 2012-12-06 LAB — TYPE AND SCREEN
ABO/RH(D): O POS
Unit division: 0
Unit division: 0

## 2013-05-21 ENCOUNTER — Ambulatory Visit (HOSPITAL_BASED_OUTPATIENT_CLINIC_OR_DEPARTMENT_OTHER)
Admission: RE | Admit: 2013-05-21 | Discharge: 2013-05-21 | Disposition: A | Discharge status: Home / Self Care | Payer: Medicare Other | Source: Ambulatory Visit | Attending: Physician Assistant | Admitting: Physician Assistant

## 2013-05-21 ENCOUNTER — Other Ambulatory Visit (HOSPITAL_BASED_OUTPATIENT_CLINIC_OR_DEPARTMENT_OTHER): Payer: Self-pay | Admitting: Physician Assistant

## 2013-05-21 DIAGNOSIS — F17200 Nicotine dependence, unspecified, uncomplicated: Secondary | ICD-10-CM | POA: Insufficient documentation

## 2013-05-21 DIAGNOSIS — I10 Essential (primary) hypertension: Secondary | ICD-10-CM | POA: Insufficient documentation

## 2013-05-21 DIAGNOSIS — M7989 Other specified soft tissue disorders: Secondary | ICD-10-CM | POA: Insufficient documentation

## 2013-07-02 ENCOUNTER — Encounter: Payer: Self-pay | Admitting: Pulmonary Disease

## 2013-07-02 ENCOUNTER — Ambulatory Visit (INDEPENDENT_AMBULATORY_CARE_PROVIDER_SITE_OTHER)
Admission: RE | Admit: 2013-07-02 | Discharge: 2013-07-02 | Disposition: A | Discharge status: Home / Self Care | Payer: Medicare Other | Source: Ambulatory Visit | Attending: Pulmonary Disease | Admitting: Pulmonary Disease

## 2013-07-02 ENCOUNTER — Ambulatory Visit (INDEPENDENT_AMBULATORY_CARE_PROVIDER_SITE_OTHER): Payer: Medicare Other | Admitting: Pulmonary Disease

## 2013-07-02 VITALS — BP 152/78 | HR 68 | Temp 97.6°F | Ht 60.0 in | Wt 91.2 lb

## 2013-07-02 DIAGNOSIS — J449 Chronic obstructive pulmonary disease, unspecified: Secondary | ICD-10-CM

## 2013-07-02 DIAGNOSIS — J441 Chronic obstructive pulmonary disease with (acute) exacerbation: Secondary | ICD-10-CM

## 2013-07-02 DIAGNOSIS — J4489 Other specified chronic obstructive pulmonary disease: Secondary | ICD-10-CM

## 2013-07-02 MED ORDER — PREDNISONE 10 MG PO TABS: ORAL_TABLET | ORAL | Status: DC

## 2013-07-02 NOTE — Assessment & Plan Note (Signed)
The patient is just being classic worsening air trapping, which in turn is leading to increased shortness of breath.  She is denying symptoms that are suggestive of a pulmonary infection.  The good news is that she quit smoking 4 weeks ago.  I will treat her with a short course of prednisone to get her through this episode, and we'll also check a chest x-ray for completeness.  She needs to followup with her primary pulmonologist in 2 weeks.

## 2013-07-02 NOTE — Patient Instructions (Addendum)
Will treat you with a course or prednisone to see if your breathing improves. Will check a chest xray today, and will call you with results. Continue your current medications. followup with Dr. Marchelle Gearing in 2 weeks, but call the end of the week if you are not improving.

## 2013-07-02 NOTE — Progress Notes (Signed)
  Subjective:    Patient ID: [name], female    DOB: 11-Oct-1932, 79 y.o.   MRN: [id number]  HPI The patient comes in today for an acute sick visit.  She is normally followed by Dr. Marchelle Gearing, but has not been seen in almost 3 years.  She has significant COPD, and is maintained on symbicort and Spiriva.  She feels that she was doing fairly well until 2 weeks ago when she began to notice increasing chest fullness, and an inability to take a deep breath.  Her description of symptoms sounds classic for air trapping.  She denies any chest congestion, cough, or purulent mucus.  She does not have any shortness of breath at rest.  She has been compliant with her medication, and quit smoking 4 weeks ago.   Review of Systems  Constitutional: Negative for fever and unexpected weight change.  HENT: Negative for ear pain, nosebleeds, congestion, sore throat, rhinorrhea, sneezing, trouble swallowing, dental problem, postnasal drip and sinus pressure.   Eyes: Negative for redness and itching.  Respiratory: Positive for cough and shortness of breath. Negative for chest tightness and wheezing.   Cardiovascular: Positive for chest pain ( left sided pain/tenderness). Negative for palpitations and leg swelling.  Gastrointestinal: Negative for nausea and vomiting.  Genitourinary: Negative for dysuria.  Musculoskeletal: Negative for joint swelling.  Skin: Negative for rash.  Neurological: Negative for headaches.  Hematological: Does not bruise/bleed easily.  Psychiatric/Behavioral: Negative for dysphoric mood. The patient is not nervous/anxious.        Objective:   Physical Exam Thin female in no acute distress Nose without purulence or discharge noted Neck without lymphadenopathy or thyromegaly Chest with decreased breath sounds throughout, no active wheezing Cardiac exam with regular rate and rhythm Lower extremities without edema, no cyanosis Alert and oriented, moves all 4  extremities.       Assessment & Plan:

## 2013-07-08 ENCOUNTER — Telehealth: Payer: Self-pay | Admitting: *Deleted

## 2013-07-08 ENCOUNTER — Ambulatory Visit (INDEPENDENT_AMBULATORY_CARE_PROVIDER_SITE_OTHER): Payer: Medicare Other | Admitting: Pulmonary Disease

## 2013-07-08 ENCOUNTER — Other Ambulatory Visit: Payer: Self-pay | Admitting: Pulmonary Disease

## 2013-07-08 ENCOUNTER — Encounter: Payer: Self-pay | Admitting: Pulmonary Disease

## 2013-07-08 VITALS — BP 130/78 | HR 67 | Temp 97.0°F | Ht 60.0 in | Wt 91.8 lb

## 2013-07-08 DIAGNOSIS — R0789 Other chest pain: Secondary | ICD-10-CM | POA: Insufficient documentation

## 2013-07-08 DIAGNOSIS — J449 Chronic obstructive pulmonary disease, unspecified: Secondary | ICD-10-CM

## 2013-07-08 NOTE — Progress Notes (Signed)
  Subjective:    Patient ID: [name], female    DOB: December 02, 1932, 79 y.o.   MRN: [id number]  HPI The patient comes in today for an acute sick visit.  She is normally followed by Dr. Marchelle Gearing for COPD, and I saw her a few weeks ago for chest tightness and shortness of breath most consistent with air trapping from her COPD.  She was treated with a course of prednisone, and saw absolutely no difference in her symptoms.  She currently is having a dry hacking cough, still has a sensation of chest pressure and inability to take a deep breath.  She has no chest congestion or mucus production.  She is also having a lot of reflux symptoms, and feels that this is aggravating her issues.  She denies pleuritic chest pain, and she has excellent oxygen saturations.   Review of Systems  Constitutional: Negative for fever and unexpected weight change.  HENT: Negative for ear pain, nosebleeds, congestion, sore throat, rhinorrhea, sneezing, trouble swallowing, dental problem, postnasal drip and sinus pressure.   Eyes: Negative for redness and itching.  Respiratory: Positive for cough, shortness of breath and wheezing. Negative for chest tightness.   Cardiovascular: Positive for chest pain ( rib pain left sided). Negative for palpitations and leg swelling.  Gastrointestinal: Negative for nausea and vomiting.  Genitourinary: Negative for dysuria.  Musculoskeletal: Positive for back pain ( middle back). Negative for joint swelling.  Skin: Negative for rash.  Neurological: Negative for headaches.  Hematological: Does not bruise/bleed easily.  Psychiatric/Behavioral: Negative for dysphoric mood. The patient is not nervous/anxious.        Objective:   Physical Exam Thin female in no acute distress Nose without purulence or discharge noted Oropharynx clear Neck without lymphadenopathy or thyromegaly Chest with mildly decreased breath sounds, adequate air flow, no wheezing Cardiac exam with regular  rate and rhythm Lower extremities with trace edema, no cyanosis alert and oriented, moves all 4 extremities       Assessment & Plan:

## 2013-07-08 NOTE — Assessment & Plan Note (Signed)
The patient continues to have a chest fullness and inability to take a deep breath that at the last visit was felt to be air trapping from a COPD exacerbation.  She was treated with a course of prednisone, and tells me that she saw absolutely no improvement.  Her chest x-ray showed changes of COPD, otherwise was unremarkable.  The fact that she saw no difference, makes me think this has nothing to do with her underlying lung disease.  He really does not sound cardiac in nature, but we'll check an echocardiogram for completeness.  It does not have a pleuritic component to it, and her sats are 98% on room air today.  She is having a lot of issues with gastroesophageal reflux symptoms, and perhaps this is leading to esophageal spasm which can mimic chest discomfort.  I will treat her with b.i.d. Proton pump inhibitor, and see if she notices a difference.  She has adequate air movement on exam today and no bronchospasm.

## 2013-07-08 NOTE — Telephone Encounter (Signed)
Ok with me to change to [facility]   Dr. Kalman Shan, M.D., [facility].C.P Pulmonary and Critical Care Medicine Staff Physician [facility] Pager: [phone], If no answer or between  15:00h - 7:00h: call [phone]  07/08/2013 3:03 PM

## 2013-07-08 NOTE — Telephone Encounter (Addendum)
Pt has been seen by Dr Shelle Iron 07/02/13 and today (07/08/13) for acute sick visits.  Patient is requesting to be transferred to be under [facility] care.  Pt is requesting phone call to let her know if switch can be made.  MR please advise if okay with this switch in physicians. Thanks.

## 2013-07-08 NOTE — Telephone Encounter (Signed)
I would rather not.  She has a pulmonologist, and doesn't have any specific conflicts that I can determine.

## 2013-07-08 NOTE — Telephone Encounter (Signed)
Spoke with pt and notified of recs per KC She verbalized understanding and states nothing further needed 

## 2013-07-08 NOTE — Patient Instructions (Addendum)
Will treat for acid reflux since this can cause chest symptoms and shortness of breath.  Take dexilant 60mg  one in am and pm until next visit. Stay on your current breathing medications. Will schedule you for a sound wave test of heart.  Will call you with results. followup with either Dr. Marchelle Gearing or our nurse practitioner next week to see how things are going.

## 2013-07-08 NOTE — Telephone Encounter (Signed)
KC, is this switch okay with you? Please advise thanks!

## 2013-07-10 ENCOUNTER — Ambulatory Visit (HOSPITAL_COMMUNITY): Payer: Medicare Other | Attending: Cardiology | Admitting: Radiology

## 2013-07-10 DIAGNOSIS — R0602 Shortness of breath: Secondary | ICD-10-CM

## 2013-07-10 DIAGNOSIS — R0609 Other forms of dyspnea: Secondary | ICD-10-CM | POA: Insufficient documentation

## 2013-07-10 DIAGNOSIS — R072 Precordial pain: Secondary | ICD-10-CM

## 2013-07-10 DIAGNOSIS — R0989 Other specified symptoms and signs involving the circulatory and respiratory systems: Secondary | ICD-10-CM | POA: Insufficient documentation

## 2013-07-10 DIAGNOSIS — R079 Chest pain, unspecified: Secondary | ICD-10-CM | POA: Insufficient documentation

## 2013-07-10 DIAGNOSIS — R0789 Other chest pain: Secondary | ICD-10-CM

## 2013-07-10 DIAGNOSIS — I10 Essential (primary) hypertension: Secondary | ICD-10-CM | POA: Insufficient documentation

## 2013-07-10 NOTE — Progress Notes (Signed)
Echocardiogram performed.  

## 2013-07-11 ENCOUNTER — Encounter (HOSPITAL_COMMUNITY): Payer: Self-pay | Admitting: Emergency Medicine

## 2013-07-11 ENCOUNTER — Emergency Department (HOSPITAL_COMMUNITY)
Admission: EM | Admit: 2013-07-11 | Discharge: 2013-07-11 | Disposition: A | Discharge status: Home / Self Care | Payer: Medicare Other | Attending: Emergency Medicine | Admitting: Emergency Medicine

## 2013-07-11 ENCOUNTER — Emergency Department (HOSPITAL_COMMUNITY): Payer: Medicare Other

## 2013-07-11 ENCOUNTER — Other Ambulatory Visit: Payer: Self-pay

## 2013-07-11 DIAGNOSIS — Z85118 Personal history of other malignant neoplasm of bronchus and lung: Secondary | ICD-10-CM | POA: Insufficient documentation

## 2013-07-11 DIAGNOSIS — I10 Essential (primary) hypertension: Secondary | ICD-10-CM | POA: Insufficient documentation

## 2013-07-11 DIAGNOSIS — Z87891 Personal history of nicotine dependence: Secondary | ICD-10-CM | POA: Insufficient documentation

## 2013-07-11 DIAGNOSIS — Z8719 Personal history of other diseases of the digestive system: Secondary | ICD-10-CM | POA: Insufficient documentation

## 2013-07-11 DIAGNOSIS — J441 Chronic obstructive pulmonary disease with (acute) exacerbation: Secondary | ICD-10-CM | POA: Insufficient documentation

## 2013-07-11 DIAGNOSIS — Z880 Allergy status to penicillin: Secondary | ICD-10-CM | POA: Insufficient documentation

## 2013-07-11 DIAGNOSIS — R0602 Shortness of breath: Secondary | ICD-10-CM

## 2013-07-11 DIAGNOSIS — Z8739 Personal history of other diseases of the musculoskeletal system and connective tissue: Secondary | ICD-10-CM | POA: Insufficient documentation

## 2013-07-11 DIAGNOSIS — Z79899 Other long term (current) drug therapy: Secondary | ICD-10-CM | POA: Insufficient documentation

## 2013-07-11 DIAGNOSIS — G8929 Other chronic pain: Secondary | ICD-10-CM | POA: Insufficient documentation

## 2013-07-11 LAB — CBC WITH DIFFERENTIAL/PLATELET
Basophils Relative: 0 % (ref 0–1)
Eosinophils Absolute: 0.3 10*3/uL (ref 0.0–0.7)
Eosinophils Relative: 2 % (ref 0–5)
Hemoglobin: 14.3 g/dL (ref 12.0–15.0)
Lymphs Abs: 2.3 10*3/uL (ref 0.7–4.0)
MCH: 30.4 pg (ref 26.0–34.0)
MCHC: 32.9 g/dL (ref 30.0–36.0)
MCV: 92.6 fL (ref 78.0–100.0)
Monocytes Absolute: 1.6 10*3/uL — ABNORMAL HIGH (ref 0.1–1.0)
Monocytes Relative: 11 % (ref 3–12)
RBC: 4.7 MIL/uL (ref 3.87–5.11)

## 2013-07-11 LAB — POCT I-STAT TROPONIN I: Troponin i, poc: 0 ng/mL (ref 0.00–0.08)

## 2013-07-11 LAB — BASIC METABOLIC PANEL
BUN: 15 mg/dL (ref 6–23)
Calcium: 8.9 mg/dL (ref 8.4–10.5)
Creatinine, Ser: 0.57 mg/dL (ref 0.50–1.10)
GFR calc non Af Amer: 87 mL/min — ABNORMAL LOW (ref >90)
Glucose, Bld: 78 mg/dL (ref 70–99)

## 2013-07-11 MED ORDER — ALBUTEROL SULFATE HFA 108 (90 BASE) MCG/ACT IN AERS
2.0000 | INHALATION_SPRAY | Freq: Once | RESPIRATORY_TRACT | Status: AC
  Administered 2013-07-11: 2 via RESPIRATORY_TRACT
  Filled 2013-07-11: qty 6.7

## 2013-07-11 MED ORDER — AEROCHAMBER PLUS FLO-VU SMALL MISC
1.0000 | Freq: Once | Status: AC
  Administered 2013-07-11: 1
  Filled 2013-07-11: qty 1

## 2013-07-11 NOTE — ED Provider Notes (Signed)
CSN: [id number]     Arrival date & time 07/11/13  1432 History     First MD Initiated Contact with Patient 07/11/13 1603     Chief Complaint  Patient presents with  . Shortness of Breath   (Consider location/radiation/quality/duration/timing/severity/associated sxs/prior Treatment) HPI Comments: Patient is a 79 year old female with a past medical history of COPD, lung cancer in 2011, hypertension, and osteoporosis who presents with worsening SOB for the past 3 days. Symptoms started gradually and progressively worsened since the onset. Patient reports worsening symptoms with exertion. No alleviating factors. Patient reports using her Spiriva inhaler daily and using her albuterol inhaler 2 times daily which has not provided any relief. Patient denies associated fever, cough, chest pain. Patient reports she stopped smoking "cold [place]" 1 week ago. Patient was seen by her Pulmonologist recently who ordered an echocardiogram which showed satisfactory cardiac function.    Past Medical History  Diagnosis Date  . lung ca dx'd 08/2010    xrt comp 10/2010  . Hypertension   . Lung cancer     LUL, LLL lung  . Osteoporosis   . Diverticulitis   . Chronic back pain    Past Surgical History  Procedure Laterality Date  . Hip arthroplasty  10/10/2012    Procedure: ARTHROPLASTY BIPOLAR HIP;  Surgeon: Shelda Pal, MD;  Location: [facility];  Service: Orthopedics;  Laterality: Left;  . Orif periprosthetic fracture  12/03/2012    Procedure: OPEN REDUCTION INTERNAL FIXATION (ORIF) PERIPROSTHETIC FRACTURE;  Surgeon: Shelda Pal, MD;  Location: [facility];  Service: Orthopedics;  Laterality: Left;   Family History  Problem Relation Age of Onset  . Cancer Mother     lung  . Cancer Father     stomach   History  Substance Use Topics  . Smoking status: Former Smoker -- 1.00 packs/day for 62 years  . Smokeless tobacco: Not on file  . Alcohol Use: No   OB History   Grav Para Term Preterm Abortions TAB  SAB Ect Mult Living                 Review of Systems  Respiratory: Positive for shortness of breath.   All other systems reviewed and are negative.    Allergies  Aspirin; Morphine and related; and Penicillins  Home Medications   Current Outpatient Rx  Name  Route  Sig  Dispense  Refill  . albuterol (PROVENTIL HFA;VENTOLIN HFA) 108 (90 BASE) MCG/ACT inhaler   Inhalation   Inhale 2 puffs into the lungs every 6 (six) hours as needed. For wheezing         . amLODipine (NORVASC) 10 MG tablet   Oral   Take 10 mg by mouth daily.         . bisacodyl (DULCOLAX) 10 MG suppository   Rectal   Place 1 suppository (10 mg total) rectally daily as needed.   12 suppository   0   . bisoprolol (ZEBETA) 5 MG tablet   Oral   Take 7.5 mg by mouth daily. Pt takes 1.5 tablet         . budesonide-formoterol (SYMBICORT) 160-4.5 MCG/ACT inhaler   Inhalation   Inhale 2 puffs into the lungs 2 (two) times daily.         Marland Kitchen buPROPion (WELLBUTRIN XL) 300 MG 24 hr tablet   Oral   Take 300 mg by mouth daily.         Marland Kitchen docusate sodium 100 MG CAPS  Oral   Take 100 mg by mouth 2 (two) times daily.   10 capsule      . fentaNYL (DURAGESIC - DOSED MCG/HR) 50 MCG/HR   Transdermal   Place 1 patch (50 mcg total) onto the skin every 3 (three) days.   5 patch   0   . levothyroxine (SYNTHROID, LEVOTHROID) 25 MCG tablet   Oral   Take 25 mcg by mouth daily.         Marland Kitchen tiotropium (SPIRIVA) 18 MCG inhalation capsule   Inhalation   Place 1 capsule (18 mcg total) into inhaler and inhale daily.   30 capsule   0    BP 145/71  Pulse 63  Temp(Src) 98.2 F (36.8 C) (Oral)  Resp 17  SpO2 98% Physical Exam  Nursing note and vitals reviewed. Constitutional: She is oriented to person, place, and time. She appears well-developed and well-nourished. No distress.  HENT:  Head: Normocephalic and atraumatic.  Eyes: Conjunctivae and EOM are normal. Pupils are equal, round, and reactive to  light.  Neck: Normal range of motion.  Cardiovascular: Normal rate, regular rhythm and intact distal pulses.  Exam reveals no gallop and no friction rub.   No murmur heard. Pulmonary/Chest: Effort normal and breath sounds normal. She has no wheezes. She has no rales. She exhibits no tenderness.  Abdominal: Soft. She exhibits no distension. There is no tenderness. There is no rebound.  Musculoskeletal: Normal range of motion.  No peripheral edema or calf tenderness.   Neurological: She is alert and oriented to person, place, and time. Coordination normal.  Speech is goal-oriented. Moves limbs without ataxia.   Skin: Skin is warm. She is diaphoretic.  Psychiatric: She has a normal mood and affect. Her behavior is normal.    ED Course   Procedures (including critical care time)   Date: 07/11/2013  Rate: 63  Rhythm: normal sinus rhythm  QRS Axis: left  Intervals: QT prolonged  ST/T Wave abnormalities: indeterminate  Conduction Disutrbances:nonspecific intraventricular conduction delay  Narrative Interpretation: NSR with nonspecific intraventricular conduction delay unchanged from previous  Old EKG Reviewed: unchanged   Labs Reviewed  CBC WITH DIFFERENTIAL - Abnormal; Notable for the following:    WBC 14.1 (*)    Neutro Abs 10.0 (*)    Monocytes Absolute 1.6 (*)    All other components within normal limits  BASIC METABOLIC PANEL - Abnormal; Notable for the following:    GFR calc non Af Amer 87 (*)    All other components within normal limits  PRO B NATRIURETIC PEPTIDE  D-DIMER, QUANTITATIVE  POCT I-STAT TROPONIN I   Dg Chest 2 View  07/11/2013   *RADIOLOGY REPORT*  Clinical Data: Shortness of breath  CHEST - 2 VIEW  Comparison: 07/02/2013  Findings: Postsurgical changes are noted on the left.  The cardiac shadow is stable.  Findings of prior vertebral augmentation are again seen.  The lungs are clear bilaterally but hyperinflated.  IMPRESSION: No acute abnormality noted.    Original Report Authenticated By: Alcide Clever, M.D.   1. SOB (shortness of breath)     MDM  4:16 PM Chest xray unremarkable for acute changes. Labs pending. EKG unremarkable for acute changes. Vitals stable and patient afebrile. Patient oxygen saturation 98% on room air at rest.  6:34 PM Labs show elevated WBC without other acute changes. Chest xray shows no acute changes.  Dr. Effie Shy will see the patient. Patient will have albuterol inhaler with spacer.   7:17 PM D-dimer negative.  Patient will be sent home with albuterol inhaler with spacer and recommended follow up with her doctor. Vitals stable and patient afebrile. Patient instructed to return with worsening or concerning symptoms.       Emilia Beck, PA-C 07/11/13 1921

## 2013-07-11 NOTE — ED Provider Notes (Signed)
[name] is a 79 y.o. female who complains of shortness of breath on exertion since stopping smoking 4 weeks ago. She has seen her pulmonologist about this twice. He recently ordered a cardiac echo that was reassuring. She is felt to have chronic air trapping from severe COPD. She is using her albuterol inhaler twice a day. She has had a treatment course of prednisone during this time. She is coughing, but not producing sputum. She is eating and drinking and has not had fever, chills, weakness, or dizziness. She does not have chest pain. She's never had cardiac disease.  Exam, elderly, frail. Alert, calm, cooperative. She is comfortable while resting supine. She has no symptoms, and 1840 hours. Oxygen saturations 99% on room air. No tachypnea or respiratory distress. Lungs have decreased air movement bilaterally without wheezes, rales, or rhonchi. Heart regular rate and rhythm. No murmur. Extremities no tenderness or swelling.   findings discussed with patient and son, who agreed with discharge plans  Assessment end-stage COPD with dyspnea on exertion. Cardiac source, pneumonia, PE, or impending vascular collapse. She is stable for discharge with outpatient management.  Medical screening examination/treatment/procedure(s) were conducted as a shared visit with non-physician practitioner(s) and myself.  I personally evaluated the patient during the encounter  Flint Melter, MD 07/12/13 0001

## 2013-07-11 NOTE — ED Notes (Signed)
Patient returned from xray.

## 2013-07-11 NOTE — ED Notes (Signed)
MD at bedside. 

## 2013-07-11 NOTE — ED Notes (Addendum)
Pt has been having sob was seen here and reffered to pulmonary md, was referred to [facility] for heart and cadiology. DOne ultrasound waiting results and still sob sats 97%, recently stopped smoking . Talking in full sentences. Also has a hx of lung ca.

## 2013-07-15 ENCOUNTER — Ambulatory Visit: Payer: Medicare Other | Admitting: Adult Health

## 2013-07-16 ENCOUNTER — Ambulatory Visit: Payer: Medicare Other | Admitting: Adult Health

## 2013-08-04 ENCOUNTER — Encounter (HOSPITAL_COMMUNITY): Payer: Self-pay | Admitting: Orthopedic Surgery

## 2013-08-07 ENCOUNTER — Inpatient Hospital Stay (HOSPITAL_COMMUNITY): Payer: Medicare Other

## 2013-08-07 ENCOUNTER — Emergency Department (HOSPITAL_COMMUNITY): Payer: Medicare Other

## 2013-08-07 ENCOUNTER — Inpatient Hospital Stay (HOSPITAL_COMMUNITY)
Admission: EM | Admit: 2013-08-07 | Discharge: 2013-08-12 | DRG: 470 | Disposition: A | Discharge status: Skilled Nursing Facility | Payer: Medicare Other | Attending: Family Medicine | Admitting: Family Medicine

## 2013-08-07 ENCOUNTER — Encounter (HOSPITAL_COMMUNITY): Payer: Self-pay | Admitting: Emergency Medicine

## 2013-08-07 DIAGNOSIS — I10 Essential (primary) hypertension: Secondary | ICD-10-CM

## 2013-08-07 DIAGNOSIS — J441 Chronic obstructive pulmonary disease with (acute) exacerbation: Secondary | ICD-10-CM

## 2013-08-07 DIAGNOSIS — R0789 Other chest pain: Secondary | ICD-10-CM

## 2013-08-07 DIAGNOSIS — R079 Chest pain, unspecified: Secondary | ICD-10-CM

## 2013-08-07 DIAGNOSIS — F329 Major depressive disorder, single episode, unspecified: Secondary | ICD-10-CM | POA: Diagnosis present

## 2013-08-07 DIAGNOSIS — W19XXXA Unspecified fall, initial encounter: Secondary | ICD-10-CM | POA: Diagnosis present

## 2013-08-07 DIAGNOSIS — J449 Chronic obstructive pulmonary disease, unspecified: Secondary | ICD-10-CM | POA: Diagnosis present

## 2013-08-07 DIAGNOSIS — M1990 Unspecified osteoarthritis, unspecified site: Secondary | ICD-10-CM

## 2013-08-07 DIAGNOSIS — D500 Iron deficiency anemia secondary to blood loss (chronic): Secondary | ICD-10-CM

## 2013-08-07 DIAGNOSIS — J309 Allergic rhinitis, unspecified: Secondary | ICD-10-CM

## 2013-08-07 DIAGNOSIS — E860 Dehydration: Secondary | ICD-10-CM

## 2013-08-07 DIAGNOSIS — E785 Hyperlipidemia, unspecified: Secondary | ICD-10-CM

## 2013-08-07 DIAGNOSIS — S72009A Fracture of unspecified part of neck of unspecified femur, initial encounter for closed fracture: Secondary | ICD-10-CM

## 2013-08-07 DIAGNOSIS — F17200 Nicotine dependence, unspecified, uncomplicated: Secondary | ICD-10-CM

## 2013-08-07 DIAGNOSIS — J189 Pneumonia, unspecified organism: Secondary | ICD-10-CM

## 2013-08-07 DIAGNOSIS — S72033A Displaced midcervical fracture of unspecified femur, initial encounter for closed fracture: Principal | ICD-10-CM | POA: Diagnosis present

## 2013-08-07 DIAGNOSIS — J984 Other disorders of lung: Secondary | ICD-10-CM | POA: Diagnosis present

## 2013-08-07 DIAGNOSIS — E876 Hypokalemia: Secondary | ICD-10-CM

## 2013-08-07 DIAGNOSIS — E039 Hypothyroidism, unspecified: Secondary | ICD-10-CM

## 2013-08-07 DIAGNOSIS — J4489 Other specified chronic obstructive pulmonary disease: Secondary | ICD-10-CM

## 2013-08-07 DIAGNOSIS — Y9210 Unspecified residential institution as the place of occurrence of the external cause: Secondary | ICD-10-CM | POA: Diagnosis present

## 2013-08-07 DIAGNOSIS — F3289 Other specified depressive episodes: Secondary | ICD-10-CM

## 2013-08-07 DIAGNOSIS — M549 Dorsalgia, unspecified: Secondary | ICD-10-CM | POA: Diagnosis present

## 2013-08-07 DIAGNOSIS — J45909 Unspecified asthma, uncomplicated: Secondary | ICD-10-CM

## 2013-08-07 DIAGNOSIS — D72829 Elevated white blood cell count, unspecified: Secondary | ICD-10-CM | POA: Diagnosis present

## 2013-08-07 DIAGNOSIS — S72001S Fracture of unspecified part of neck of right femur, sequela: Secondary | ICD-10-CM

## 2013-08-07 DIAGNOSIS — R911 Solitary pulmonary nodule: Secondary | ICD-10-CM | POA: Diagnosis present

## 2013-08-07 DIAGNOSIS — A0472 Enterocolitis due to Clostridium difficile, not specified as recurrent: Secondary | ICD-10-CM

## 2013-08-07 DIAGNOSIS — E049 Nontoxic goiter, unspecified: Secondary | ICD-10-CM

## 2013-08-07 DIAGNOSIS — C3490 Malignant neoplasm of unspecified part of unspecified bronchus or lung: Secondary | ICD-10-CM

## 2013-08-07 DIAGNOSIS — B370 Candidal stomatitis: Secondary | ICD-10-CM

## 2013-08-07 DIAGNOSIS — M810 Age-related osteoporosis without current pathological fracture: Secondary | ICD-10-CM

## 2013-08-07 DIAGNOSIS — Z87891 Personal history of nicotine dependence: Secondary | ICD-10-CM

## 2013-08-07 DIAGNOSIS — S72001A Fracture of unspecified part of neck of right femur, initial encounter for closed fracture: Secondary | ICD-10-CM

## 2013-08-07 DIAGNOSIS — G8929 Other chronic pain: Secondary | ICD-10-CM

## 2013-08-07 LAB — CBC WITH DIFFERENTIAL/PLATELET
Basophils Absolute: 0.1 10*3/uL (ref 0.0–0.1)
Basophils Relative: 0 % (ref 0–1)
Eosinophils Absolute: 0.1 10*3/uL (ref 0.0–0.7)
Eosinophils Relative: 0 % (ref 0–5)
HCT: 44.1 % (ref 36.0–46.0)
Hemoglobin: 15.2 g/dL — ABNORMAL HIGH (ref 12.0–15.0)
MCH: 31 pg (ref 26.0–34.0)
MCHC: 34.5 g/dL (ref 30.0–36.0)
Monocytes Absolute: 1.5 10*3/uL — ABNORMAL HIGH (ref 0.1–1.0)
Monocytes Relative: 6 % (ref 3–12)
RDW: 13.4 % (ref 11.5–15.5)

## 2013-08-07 LAB — URINALYSIS, ROUTINE W REFLEX MICROSCOPIC
Bilirubin Urine: NEGATIVE
Glucose, UA: NEGATIVE mg/dL
Ketones, ur: NEGATIVE mg/dL
Leukocytes, UA: NEGATIVE
Specific Gravity, Urine: 1.023 (ref 1.005–1.030)
pH: 5 (ref 5.0–8.0)

## 2013-08-07 LAB — BASIC METABOLIC PANEL
BUN: 20 mg/dL (ref 6–23)
CO2: 21 mEq/L (ref 19–32)
Chloride: 101 mEq/L (ref 96–112)
Creatinine, Ser: 0.53 mg/dL (ref 0.50–1.10)
Glucose, Bld: 113 mg/dL — ABNORMAL HIGH (ref 70–99)

## 2013-08-07 LAB — TYPE AND SCREEN: ABO/RH(D): O POS

## 2013-08-07 MED ORDER — SODIUM CHLORIDE 0.9 % IV SOLN
INTRAVENOUS | Status: DC
  Administered 2013-08-07 – 2013-08-11 (×4): via INTRAVENOUS

## 2013-08-07 MED ORDER — TRAMADOL HCL 50 MG PO TABS
50.0000 mg | ORAL_TABLET | Freq: Four times a day (QID) | ORAL | Status: DC | PRN
  Administered 2013-08-11 – 2013-08-12 (×3): 50 mg via ORAL
  Filled 2013-08-07 (×3): qty 1

## 2013-08-07 MED ORDER — BUDESONIDE-FORMOTEROL FUMARATE 160-4.5 MCG/ACT IN AERO
2.0000 | INHALATION_SPRAY | Freq: Two times a day (BID) | RESPIRATORY_TRACT | Status: DC
  Administered 2013-08-07 – 2013-08-12 (×9): 2 via RESPIRATORY_TRACT
  Filled 2013-08-07: qty 6

## 2013-08-07 MED ORDER — FENTANYL CITRATE 0.05 MG/ML IJ SOLN
25.0000 ug | INTRAMUSCULAR | Status: DC | PRN
  Administered 2013-08-07 – 2013-08-09 (×5): 25 ug via INTRAVENOUS
  Filled 2013-08-07 (×5): qty 2

## 2013-08-07 MED ORDER — ONDANSETRON HCL 4 MG/2ML IJ SOLN: 4.0000 mg | Freq: Three times a day (TID) | INTRAMUSCULAR | Status: AC | PRN

## 2013-08-07 MED ORDER — OXYCODONE-ACETAMINOPHEN 5-325 MG PO TABS
1.0000 | ORAL_TABLET | Freq: Once | ORAL | Status: AC
  Administered 2013-08-07: 1 via ORAL
  Filled 2013-08-07: qty 1

## 2013-08-07 MED ORDER — SODIUM CHLORIDE 0.9 % IV SOLN
INTRAVENOUS | Status: AC
  Administered 2013-08-07: 21:00:00 via INTRAVENOUS

## 2013-08-07 MED ORDER — BISOPROLOL FUMARATE 5 MG PO TABS
7.5000 mg | ORAL_TABLET | Freq: Every day | ORAL | Status: DC
  Administered 2013-08-08 – 2013-08-12 (×5): 7.5 mg via ORAL
  Filled 2013-08-07 (×5): qty 1.5

## 2013-08-07 MED ORDER — MORPHINE SULFATE 2 MG/ML IJ SOLN: 0.5000 mg | INTRAMUSCULAR | Status: DC | PRN

## 2013-08-07 MED ORDER — HEPARIN SODIUM (PORCINE) 5000 UNIT/ML IJ SOLN
5000.0000 [IU] | Freq: Three times a day (TID) | INTRAMUSCULAR | Status: DC
  Administered 2013-08-08 (×2): 5000 [IU] via SUBCUTANEOUS
  Filled 2013-08-07 (×8): qty 1

## 2013-08-07 MED ORDER — ALBUTEROL SULFATE HFA 108 (90 BASE) MCG/ACT IN AERS
2.0000 | INHALATION_SPRAY | Freq: Four times a day (QID) | RESPIRATORY_TRACT | Status: DC | PRN
  Filled 2013-08-07: qty 6.7

## 2013-08-07 MED ORDER — LEVOTHYROXINE SODIUM 25 MCG PO TABS
25.0000 ug | ORAL_TABLET | Freq: Every day | ORAL | Status: DC
  Administered 2013-08-08 – 2013-08-12 (×4): 25 ug via ORAL
  Filled 2013-08-07 (×6): qty 1

## 2013-08-07 MED ORDER — HYDROCODONE-ACETAMINOPHEN 5-325 MG PO TABS
1.0000 | ORAL_TABLET | Freq: Four times a day (QID) | ORAL | Status: DC | PRN
  Administered 2013-08-08 (×3): 2 via ORAL
  Administered 2013-08-09: 1 via ORAL
  Administered 2013-08-10 (×3): 2 via ORAL
  Administered 2013-08-10: 1 via ORAL
  Administered 2013-08-11: 2 via ORAL
  Administered 2013-08-11: 1 via ORAL
  Administered 2013-08-11 – 2013-08-12 (×2): 2 via ORAL
  Administered 2013-08-12: 1 via ORAL
  Filled 2013-08-07 (×3): qty 2
  Filled 2013-08-07 (×2): qty 1
  Filled 2013-08-07 (×8): qty 2

## 2013-08-07 MED ORDER — AMLODIPINE BESYLATE 10 MG PO TABS
10.0000 mg | ORAL_TABLET | Freq: Every day | ORAL | Status: DC
  Administered 2013-08-08 – 2013-08-12 (×4): 10 mg via ORAL
  Filled 2013-08-07 (×5): qty 1

## 2013-08-07 MED ORDER — FENTANYL 50 MCG/HR TD PT72
50.0000 ug | MEDICATED_PATCH | TRANSDERMAL | Status: DC
  Administered 2013-08-07 – 2013-08-10 (×2): 50 ug via TRANSDERMAL
  Filled 2013-08-07 (×2): qty 1

## 2013-08-07 NOTE — ED Notes (Signed)
Patient transported to X-ray 

## 2013-08-07 NOTE — ED Provider Notes (Signed)
TIME SEEN: 5:29 PM  CHIEF COMPLAINT: Fall, right-sided hip pain  HPI: Patient is a 79 year old female with a history of hypertension, lung cancer who presents the emergency department with right-sided hip pain after a fall. Patient reports that she was using her walker and backing up into a couch to sit down when she missed the couch and landed on her bottom. She denies any head injury or loss of consciousness. She denies any chest pain, shortness of breath, dizziness, palpitations that led to her fall. No numbness, tingling or focal weakness. No recent vomiting or diarrhea. No dysuria or hematuria. She has been unable to angulate since the fall. She points to the lateral aspect of her right thigh is the source of her pain but is unable to range her hip.  ROS: See HPI Constitutional: no fever  Eyes: no drainage  ENT: no runny nose   Cardiovascular:  no chest pain  Resp: no SOB  GI: no vomiting GU: no dysuria Integumentary: no rash  Allergy: no hives  Musculoskeletal: no leg swelling  Neurological: no slurred speech ROS otherwise negative  PAST MEDICAL HISTORY/PAST SURGICAL HISTORY:  Past Medical History  Diagnosis Date  . lung ca dx'd 08/2010    xrt comp 10/2010  . Hypertension   . Lung cancer     LUL, LLL lung  . Osteoporosis   . Diverticulitis   . Chronic back pain     MEDICATIONS:  Prior to Admission medications   Medication Sig Start Date End Date Taking? Authorizing Provider  albuterol (PROVENTIL HFA;VENTOLIN HFA) 108 (90 BASE) MCG/ACT inhaler Inhale 2 puffs into the lungs every 6 (six) hours as needed. For wheezing   Yes Historical Provider, MD  amLODipine (NORVASC) 10 MG tablet Take 10 mg by mouth daily.   Yes Historical Provider, MD  bisoprolol (ZEBETA) 5 MG tablet Take 7.5 mg by mouth daily. Pt takes 1.5 tablet   Yes Historical Provider, MD  budesonide-formoterol (SYMBICORT) 160-4.5 MCG/ACT inhaler Inhale 2 puffs into the lungs 2 (two) times daily.   Yes Historical  Provider, MD  fentaNYL (DURAGESIC - DOSED MCG/HR) 50 MCG/HR Place 1 patch (50 mcg total) onto the skin every 3 (three) days. 12/05/12  Yes Laveda Norman, MD  levothyroxine (SYNTHROID, LEVOTHROID) 25 MCG tablet Take 25 mcg by mouth daily.   Yes Historical Provider, MD  traMADol (ULTRAM) 50 MG tablet Take 50 mg by mouth every 6 (six) hours as needed for pain.   Yes Historical Provider, MD    ALLERGIES:  Allergies  Allergen Reactions  . Aspirin     REACTION: gi upset  . Morphine And Related Other (See Comments)    Hallucinations  . Penicillins Nausea And Vomiting    SOCIAL HISTORY:  History  Substance Use Topics  . Smoking status: Former Smoker -- 1.00 packs/day for 62 years  . Smokeless tobacco: Not on file  . Alcohol Use: No    FAMILY HISTORY: Family History  Problem Relation Age of Onset  . Cancer Mother     lung  . Cancer Father     stomach    EXAM: BP 173/77  Pulse 47  Temp(Src) 98 F (36.7 C) (Oral)  Resp 20  SpO2 92% CONSTITUTIONAL: Alert and oriented and responds appropriately to questions. Well-appearing; well-nourished; GCS 15 HEAD: Normocephalic; atraumatic EYES: Conjunctivae clear, PERRL, EOMI ENT: normal nose; no rhinorrhea; moist mucous membranes; pharynx without lesions noted; no dental injury; no hemotypanum; no septal hematoma NECK: Supple, no meningismus, no LAD;  no midline spinal tenderness, step-off or deformity CARD: RRR; S1 and S2 appreciated; no murmurs, no clicks, no rubs, no gallops RESP: Normal chest excursion without splinting or tachypnea; breath sounds clear and equal bilaterally; no wheezes, no rhonchi, no rales; chest wall stable, nontender to palpation ABD/GI: Normal bowel sounds; non-distended; soft, non-tender, no rebound, no guarding PELVIS:  stable, nontender to palpation BACK:  The back appears normal and is non-tender to palpation, there is no CVA tenderness; no midline spinal tenderness, step-off or deformity EXT: Pain with internal  and external rotation of the right hip as well as flexion, no bony tenderness, no leg length discrepancy, otherwise Normal ROM in all joints; non-tender to palpation; no edema; normal capillary refill; no cyanosis   , sensation to light touch intact diffusely, 2+ DP pulses bilaterally, feet are warm and well perfused SKIN: Normal color for age and race; warm NEURO: Decreased range of motion in the right lower extremity secondary to pain, otherwise Moves all extremities equally, sensation to light touch intact diffusely, cranial nerves II through XII intact PSYCH: The patient's mood and manner are appropriate. Grooming and personal hygiene are appropriate.  MEDICAL DECISION MAKING: Patient with mechanical fall with right-sided hip pain. No other injury. Will obtain x-rays of her pelvis, right hip, right femur. We'll give oral pain medication.  ED PROGRESS:  6:28 PM  Pt with right femoral cervical neck fracture. Will obtain basic labs. Her PCP is at [facility] physicians, Dr. Annitta Needs.  She seen Dr. Charlann Boxer in the past for her prior left hip arthroplasty and was like to see him again today. Will d/w hospitalist in orthopedics for admission.  Layla Maw Ward, DO 08/07/13 1859

## 2013-08-07 NOTE — ED Notes (Signed)
Bed: OZ30 Expected date:  Expected time:  Means of arrival:  Comments: ems- 79 fall

## 2013-08-07 NOTE — Progress Notes (Signed)
Patient does not use a computer and does not want to sign up for My Chart. Briscoe Burns BSN, RN-BC Admissions RN  08/07/2013 6:58 PM

## 2013-08-07 NOTE — ED Notes (Signed)
Per EMS-pt from [facility] with c/o of fall. C/o of right side thigh to knee pain. 10/10 upon movement. Denies LOC.

## 2013-08-07 NOTE — Progress Notes (Signed)
[facility] History and Physical  DARIN REDMANN RUE:[id number] DOB: May 14, 1932 DOA: 08/07/2013  Referring physician: Elesa Massed, EDP PCP: [name], MD  Specialists: [facility]  Chief Complaint: Fall and fracture  HPI: [name] is a 79 y.o. Caucasian female with known history of hypertension, adenocarcinoma left lung status post radiotherapy, prior tobacco use [quit 5 2014] depression, severe osteoporosis, stage 2-3 COPD, osteoarthritis who presented to [facility] long emergency room after falling accidentally out of her chair slipping and hurting herself on her right hip. She has history of multiple falls  as well as periprosthetic breakup of her left hip in addition to fracture of her femur in the past. She states she could not get up she did not have any dizziness at time of fall or any weakness has not been sick lately has had a good appetite and has been doing fairly well after she was recently rehabilitated at [facility] after the last fall. She gets on usually with the help of walker, lives alone at home and has a son who lives in [place]. She has a caretaker that comes in to help her She has no fever no chills no nausea no vomiting no chest pain no blurred vision no double vision no weakness on any one side of body no melena no hematemesis no other issue   Labs, chest x-ray, EKG has not been performed by the ED as yet but has been ordered Orthopedics will be consulted by emergency room  Review of Systems: See above  Past Medical History  Diagnosis Date  . lung ca dx'd 08/2010    xrt comp 10/2010  . Hypertension   . Lung cancer     LUL, LLL lung  . Osteoporosis   . Diverticulitis   . Chronic back pain    Past Surgical History  Procedure Laterality Date  . Hip arthroplasty  10/10/2012    Procedure: ARTHROPLASTY BIPOLAR HIP;  Surgeon: Shelda Pal, MD;  Location: [facility] ORS;  Service: Orthopedics;  Laterality: Left;  . Orif periprosthetic fracture Left  12/03/2012    Procedure: OPEN REDUCTION INTERNAL FIXATION (ORIF) PERIPROSTHETIC FRACTURE;  Surgeon: Shelda Pal, MD;  Location: [facility];  Service: Orthopedics;  Laterality: Left;   Social History:  reports that she has quit smoking. She does not have any smokeless tobacco history on file. She reports that she does not drink alcohol or use illicit drugs.  Allergies  Allergen Reactions  . Aspirin     REACTION: gi upset  . Morphine And Related Other (See Comments)    Hallucinations  . Penicillins Nausea And Vomiting    Family History  Problem Relation Age of Onset  . Cancer Mother     lung  . Cancer Father     stomach    Prior to Admission medications   Medication Sig Start Date End Date Taking? Authorizing Provider  albuterol (PROVENTIL HFA;VENTOLIN HFA) 108 (90 BASE) MCG/ACT inhaler Inhale 2 puffs into the lungs every 6 (six) hours as needed. For wheezing   Yes Historical Provider, MD  amLODipine (NORVASC) 10 MG tablet Take 10 mg by mouth daily.   Yes Historical Provider, MD  bisoprolol (ZEBETA) 5 MG tablet Take 7.5 mg by mouth daily. Pt takes 1.5 tablet   Yes Historical Provider, MD  budesonide-formoterol (SYMBICORT) 160-4.5 MCG/ACT inhaler Inhale 2 puffs into the lungs 2 (two) times daily.   Yes Historical Provider, MD  fentaNYL (DURAGESIC - DOSED MCG/HR) 50 MCG/HR Place 1 patch (50 mcg  total) onto the skin every 3 (three) days. 12/05/12  Yes Laveda Norman, MD  levothyroxine (SYNTHROID, LEVOTHROID) 25 MCG tablet Take 25 mcg by mouth daily.   Yes Historical Provider, MD  traMADol (ULTRAM) 50 MG tablet Take 50 mg by mouth every 6 (six) hours as needed for pain.   Yes Historical Provider, MD   Physical Exam: Filed Vitals:   08/07/13 1655  BP: 173/77  Pulse: 47  Temp: 98 F (36.7 C)  Resp: 20     General:  Alert pleasant oriented no apparent distress  Eyes: EOMI NCAT  ENT: Good motion, moderate dentition  Neck: Soft supple  Cardiovascular: S1-S2 no murmur rub or  gallop  Respiratory: Clinically clear  Abdomen: Soft nontender nondistended  Skin:  Intact no lower extremity edema  Musculoskeletal: Range of motion right hip limited  Psychiatric: Euthymic  Neurologic: Grossly intact, moves all 4 limbs equally other than fractured right lower extremity, sensory intact   Labs on Admission:  Basic Metabolic Panel: No results found for this basename: NA, K, CL, CO2, GLUCOSE, BUN, CREATININE, CALCIUM, MG, PHOS,  in the last 168 hours Liver Function Tests: No results found for this basename: AST, ALT, ALKPHOS, BILITOT, PROT, ALBUMIN,  in the last 168 hours No results found for this basename: LIPASE, AMYLASE,  in the last 168 hours No results found for this basename: AMMONIA,  in the last 168 hours CBC: No results found for this basename: WBC, NEUTROABS, HGB, HCT, MCV, PLT,  in the last 168 hours Cardiac Enzymes: No results found for this basename: CKTOTAL, CKMB, CKMBINDEX, TROPONINI,  in the last 168 hours  BNP (last 3 results)  Recent Labs  07/11/13 1619  PROBNP 270.7   CBG: No results found for this basename: GLUCAP,  in the last 168 hours  Radiological Exams on Admission: Dg Hip Complete Right  08/07/2013   *RADIOLOGY REPORT*  Clinical Data: Fall, right hip pain  RIGHT HIP - COMPLETE 2+ VIEW  Comparison: None.  Findings: There is a mid-cervical fracture of the right femoral neck.  The femoral head over veins the femoral neck, but there is no fracture line.  This suggests an old, healed subcapital fracture.  The mid-cervical fracture line is well defined.  The shaft fracture fragment has mildly displaced superiorly by 5 mm. There is mild varus angulation.  There is no fracture comminution. There is apex anterior angulation and more significant anterior displacement measuring just over 1 cm.  There are no other fractures.  The bones are diffusely demineralized.  The left hip prosthesis appears well seated and aligned.  IMPRESSION: Mid-cervical  fracture of the right femoral neck, mildly displaced with mild varus angulation as well as apex anterior angulation.  No comminution.   Original Report Authenticated By: Amie Portland, M.D.   Dg Femur Right  08/07/2013   *RADIOLOGY REPORT*  Clinical Data: Fall.  Right hip pain.  RIGHT FEMUR - 2 VIEW  Comparison: None.  Findings: The right mid-cervical femoral neck fractures again noted, described on the right hip radiographs.  There are no additional fractures.  The hip knee joints are normally aligned.  The bones are diffusely demineralized.  IMPRESSION: Mid-cervical fracture of the right femoral neck.  No other fractures.  No dislocations   Original Report Authenticated By: Amie Portland, M.D.    EKG: Independently reviewed. None performed as yet  Assessment/Plan Principal Problem:   Hip fracture, right Active Problems:   Goiter, unspecified   HYPERLIPIDEMIA   DEPRESSION  HYPERTENSION   C O P D WITH ACUTE EXACERBATION   C O P D   PULMONARY NODULE   HTN (hypertension)   Chronic back pain   1. Right hip Fracture-initial work up labs have not been ordered-I've asked ED physician to order at least basic metabolic CBC chest x-ray and EKG however based on the fact that she had surgery just recently in December of 2013, I expect that she should be cleared for surgery once initial work up is performed-she's type and screen for surgery, await baseline labs-she will get Norco 5/325 every 6 hours and morphine 2 mg every 2 when necessary 2. Hypertension continue bisoprolol 7.5 mg daily , amlodipine 10 mg daily-complete lab work to determine further 3. Hypothyroidism continue Synthroid 25 mcg daily  4. COPD , recently quit smoking and congratulated continue Symbicort 2 puffs twice a day , albuterol 108 micrograms 2 puffs every 6 when necessary 5. Chronic pain-will order fentanyl patch 50 mcg for now, tramadol 50 mg every 6 when necessary -she may need further pain management subsequent to surgery   6.  adenocarcinoma lung status post radiation therapy-this is currently stable issue    Code Status:  full  (must indicate code status--if unknown or must be presumed, indicate so) Family Communication:  none at bedside  (indicate person spoken with, if applicable, with phone number if by telephone) Disposition Plan:  inpatient  (indicate anticipated LOS)  Time spent: 45   Rosemae Mcquown, [facility] Pager [phone]   If 7PM-7AM, please contact night-coverage [email or web address] Password [facility] 08/07/2013, 6:39 PM

## 2013-08-08 DIAGNOSIS — E039 Hypothyroidism, unspecified: Secondary | ICD-10-CM

## 2013-08-08 DIAGNOSIS — I10 Essential (primary) hypertension: Secondary | ICD-10-CM

## 2013-08-08 DIAGNOSIS — J449 Chronic obstructive pulmonary disease, unspecified: Secondary | ICD-10-CM

## 2013-08-08 DIAGNOSIS — S72009S Fracture of unspecified part of neck of unspecified femur, sequela: Secondary | ICD-10-CM

## 2013-08-08 DIAGNOSIS — J45909 Unspecified asthma, uncomplicated: Secondary | ICD-10-CM

## 2013-08-08 DIAGNOSIS — C3490 Malignant neoplasm of unspecified part of unspecified bronchus or lung: Secondary | ICD-10-CM

## 2013-08-08 LAB — BASIC METABOLIC PANEL
CO2: 26 mEq/L (ref 19–32)
Calcium: 8.6 mg/dL (ref 8.4–10.5)
Creatinine, Ser: 0.55 mg/dL (ref 0.50–1.10)
GFR calc Af Amer: >90 mL/min (ref >90)
Sodium: 137 mEq/L (ref 135–145)

## 2013-08-08 LAB — CBC
MCH: 30.1 pg (ref 26.0–34.0)
MCV: 91 fL (ref 78.0–100.0)
Platelets: 279 10*3/uL (ref 150–400)
RBC: 4.91 MIL/uL (ref 3.87–5.11)
RDW: 13.6 % (ref 11.5–15.5)
WBC: 17.9 10*3/uL — ABNORMAL HIGH (ref 4.0–10.5)

## 2013-08-08 LAB — SURGICAL PCR SCREEN
MRSA, PCR: NEGATIVE
Staphylococcus aureus: POSITIVE — AB

## 2013-08-08 MED ORDER — BOOST / RESOURCE BREEZE PO LIQD
1.0000 | ORAL | Status: DC
  Administered 2013-08-10: 1 via ORAL

## 2013-08-08 NOTE — Progress Notes (Signed)
Utilization review completed.  

## 2013-08-08 NOTE — Progress Notes (Signed)
[facility] PROGRESS NOTE  [name] ZOX:[id number] DOB: 14-Jun-1932 DOA: 08/07/2013 PCP: [name], MD  Assessment/Plan: 1. Right hip fx - Ortho on board and managing - Plan is for operation on 8/22 - Most likely cause of leukocytosis  2. HTN - Last blood pressure 126/66 - At this point will continue to monitor - Will continue bisoprolol, amlodipine  3. Hypothyroidism - Continue Synthroid  4. COPD - stable currently continue current regimen  5. Adenocarcinoma lung s/p radiation therapy: stable issue   Code Status: full Family Communication: no family at bedside.  Disposition Plan: Pending further recommendations from orthopaedic surgeon   Consultants:  Ortho  Procedures:  None currently  Antibiotics:  None  HPI/Subjective: Pt has no new complaints. Pain is currently tolerable.   Objective: Filed Vitals:   08/08/13 1420  BP: 127/66  Pulse: 88  Temp: 98.3 F (36.8 C)  Resp: 16    Intake/Output Summary (Last 24 hours) at 08/08/13 1515 Last data filed at 08/08/13 1300  Gross per 24 hour  Intake 814.16 ml  Output   1200 ml  Net -385.84 ml   Filed Weights   08/07/13 2243  Weight: 41.5 kg (91 lb 7.9 oz)    Exam:   General:  Pt in NAD, Alert and Awake  Cardiovascular: RRR, no MRG  Respiratory: CTA BL, no wheezes  Abdomen: soft, NT, ND  Musculoskeletal: pain at right hip.   Data Reviewed: Basic Metabolic Panel:  Recent Labs Lab 08/07/13 1830 08/08/13 0427  NA 135 137  K 3.8 4.3  CL 101 103  CO2 21 26  GLUCOSE 113* 115*  BUN 20 12  CREATININE 0.53 0.55  CALCIUM 9.3 8.6   Liver Function Tests: No results found for this basename: AST, ALT, ALKPHOS, BILITOT, PROT, ALBUMIN,  in the last 168 hours No results found for this basename: LIPASE, AMYLASE,  in the last 168 hours No results found for this basename: AMMONIA,  in the last 168 hours CBC:  Recent Labs Lab 08/07/13 1830 08/08/13 0427  WBC 25.3* 17.9*   NEUTROABS 22.1*  --   HGB 15.2* 14.8  HCT 44.1 44.7  MCV 89.8 91.0  PLT 305 279   Cardiac Enzymes: No results found for this basename: CKTOTAL, CKMB, CKMBINDEX, TROPONINI,  in the last 168 hours BNP (last 3 results)  Recent Labs  07/11/13 1619  PROBNP 270.7   CBG: No results found for this basename: GLUCAP,  in the last 168 hours  Recent Results (from the past 240 hour(s))  SURGICAL PCR SCREEN     Status: Abnormal   Collection Time    08/08/13 12:41 AM      Result Value Range Status   MRSA, PCR NEGATIVE  NEGATIVE Final   Staphylococcus aureus POSITIVE (*) NEGATIVE Final   Comment:            The Xpert SA Assay (FDA     approved for NASAL specimens     in patients over 11 years of age),     is one component of     a comprehensive surveillance     program.  Test performance has     been validated by [employer] for patients greater     than or equal to 33 year old.     It is not intended     to diagnose infection nor to     guide or monitor treatment.     Studies: Dg  Chest 2 View  08/07/2013   CLINICAL DATA:  Lung cancer. Preoperative for hip fracture. Hypertension.  EXAM: CHEST  2 VIEW  COMPARISON:  07/11/2013  FINDINGS: Emphysema and fibrosis. Fiducials in the left upper lobe and left lower lobe with adjacent ill-defined densities similar to prior. Densely calcified mitral valve.  Prior thoracolumbar compression fractures with vertebral augmentation.  IMPRESSION: 1. Emphysema and fibrosis. 2. Stable vague densities in the left upper lobe and left lower lobe, with adjacent fiducial markers. 3. Densely calcified mitral valve. 4. Thoracolumbar compression fractures with prior vertebral augmentations.   Electronically Signed   By: Herbie Baltimore   On: 08/07/2013 19:40   Dg Hip Complete Right  08/07/2013   *RADIOLOGY REPORT*  Clinical Data: Fall, right hip pain  RIGHT HIP - COMPLETE 2+ VIEW  Comparison: None.  Findings: There is a mid-cervical fracture of the right  femoral neck.  The femoral head over veins the femoral neck, but there is no fracture line.  This suggests an old, healed subcapital fracture.  The mid-cervical fracture line is well defined.  The shaft fracture fragment has mildly displaced superiorly by 5 mm. There is mild varus angulation.  There is no fracture comminution. There is apex anterior angulation and more significant anterior displacement measuring just over 1 cm.  There are no other fractures.  The bones are diffusely demineralized.  The left hip prosthesis appears well seated and aligned.  IMPRESSION: Mid-cervical fracture of the right femoral neck, mildly displaced with mild varus angulation as well as apex anterior angulation.  No comminution.   Original Report Authenticated By: Amie Portland, M.D.   Dg Femur Right  08/07/2013   *RADIOLOGY REPORT*  Clinical Data: Fall.  Right hip pain.  RIGHT FEMUR - 2 VIEW  Comparison: None.  Findings: The right mid-cervical femoral neck fractures again noted, described on the right hip radiographs.  There are no additional fractures.  The hip knee joints are normally aligned.  The bones are diffusely demineralized.  IMPRESSION: Mid-cervical fracture of the right femoral neck.  No other fractures.  No dislocations   Original Report Authenticated By: Amie Portland, M.D.    Scheduled Meds: . amLODipine  10 mg Oral Daily  . bisoprolol  7.5 mg Oral Daily  . budesonide-formoterol  2 puff Inhalation BID  . fentaNYL  50 mcg Transdermal Q72H  . heparin  5,000 Units Subcutaneous Q8H  . levothyroxine  25 mcg Oral QAC breakfast   Continuous Infusions: . sodium chloride 50 mL/hr at 08/08/13 0700    Principal Problem:   Hip fracture, right Active Problems:   Goiter, unspecified   HYPERLIPIDEMIA   DEPRESSION   HYPERTENSION   C O P D WITH ACUTE EXACERBATION   C O P D   PULMONARY NODULE   HTN (hypertension)   Chronic back pain    Time spent: > 35 minutes    Penny Pia  [facility] Pager [phone] If 7PM-7AM, please contact night-coverage at [email or web address], password [facility] 08/08/2013, 3:15 PM  LOS: 1 day

## 2013-08-08 NOTE — Consult Note (Signed)
Reason for Consult:   Right hip fracture Referring Physician:   ED Physician  [name] is an 79 y.o. female.  HPI:    Patient is a 79 year old female with a history of hypertension, lung cancer who presented the emergency department with right-sided hip pain after a fall. Patient reports that she was using her cane when she tripped over a chair and landed on her bottom / right hip. She states that she instantly knew she had broke her hip, had had previous fracture on the left hip.  She denies any head injury or loss of consciousness. She denies any chest pain, shortness of breath, dizziness, palpitations that led to her fall. No numbness, tingling or focal weakness. No recent vomiting or diarrhea. No dysuria or hematuria. She has been unable to ambulate since the fall. X-rays take revealed a mid-cervical fracture of the right femoral neck.  Dr. Charlann Boxer was consulted per the patient's request.    Past Medical History  Diagnosis Date  . lung ca dx'd 08/2010    xrt comp 10/2010  . Hypertension   . Lung cancer     LUL, LLL lung  . Osteoporosis   . Diverticulitis   . Chronic back pain     Past Surgical History  Procedure Laterality Date  . Hip arthroplasty  10/10/2012    Procedure: ARTHROPLASTY BIPOLAR HIP;  Surgeon: Shelda Pal, MD;  Location: [facility];  Service: Orthopedics;  Laterality: Left;  . Orif periprosthetic fracture Left 12/03/2012    Procedure: OPEN REDUCTION INTERNAL FIXATION (ORIF) PERIPROSTHETIC FRACTURE;  Surgeon: Shelda Pal, MD;  Location: [facility];  Service: Orthopedics;  Laterality: Left;    Family History  Problem Relation Age of Onset  . Cancer Mother     lung  . Cancer Father     stomach    Social History:  reports that she quit smoking about 3 months ago. She has never used smokeless tobacco. She reports that she does not drink alcohol or use illicit drugs.  Allergies:  Allergies  Allergen Reactions  . Aspirin     REACTION: gi upset  . Morphine  And Related Other (See Comments)    Hallucinations  . Penicillins Nausea And Vomiting     Results for orders placed during the hospital encounter of 08/07/13 (from the past 48 hour(s))  CBC WITH DIFFERENTIAL     Status: Abnormal   Collection Time    08/07/13  6:30 PM      Result Value Range   WBC 25.3 (*) 4.0 - 10.5 K/uL   RBC 4.91  3.87 - 5.11 MIL/uL   Hemoglobin 15.2 (*) 12.0 - 15.0 g/dL   HCT 16.1  09.6 - 04.5 %   MCV 89.8  78.0 - 100.0 fL   MCH 31.0  26.0 - 34.0 pg   MCHC 34.5  30.0 - 36.0 g/dL   RDW 40.9  81.1 - 91.4 %   Platelets 305  150 - 400 K/uL   Neutrophils Relative % 87 (*) 43 - 77 %   Neutro Abs 22.1 (*) 1.7 - 7.7 K/uL   Lymphocytes Relative 6 (*) 12 - 46 %   Lymphs Abs 1.6  0.7 - 4.0 K/uL   Monocytes Relative 6  3 - 12 %   Monocytes Absolute 1.5 (*) 0.1 - 1.0 K/uL   Eosinophils Relative 0  0 - 5 %   Eosinophils Absolute 0.1  0.0 - 0.7 K/uL   Basophils Relative 0  0 -  1 %   Basophils Absolute 0.1  0.0 - 0.1 K/uL   WBC Morphology WHITE COUNT CONFIRMED ON SMEAR     RBC Morphology POLYCHROMASIA PRESENT     Smear Review PLATELET COUNT CONFIRMED BY SMEAR    BASIC METABOLIC PANEL     Status: Abnormal   Collection Time    08/07/13  6:30 PM      Result Value Range   Sodium 135  135 - 145 mEq/L   Potassium 3.8  3.5 - 5.1 mEq/L   Chloride 101  96 - 112 mEq/L   CO2 21  19 - 32 mEq/L   Glucose, Bld 113 (*) 70 - 99 mg/dL   BUN 20  6 - 23 mg/dL   Creatinine, Ser 0.10  0.50 - 1.10 mg/dL   Calcium 9.3  8.4 - 27.2 mg/dL   GFR calc non Af Amer 89 (*) >90 mL/min   GFR calc Af Amer >90  >90 mL/min   Comment: (NOTE)     The eGFR has been calculated using the CKD EPI equation.     This calculation has not been validated in all clinical situations.     eGFR's persistently <90 mL/min signify possible Chronic Kidney     Disease.  TYPE AND SCREEN     Status: None   Collection Time    08/07/13  6:30 PM      Result Value Range   ABO/RH(D) O POS     Antibody Screen NEG      Sample Expiration 08/10/2013    APTT     Status: None   Collection Time    08/07/13  6:30 PM      Result Value Range   aPTT 31  24 - 37 seconds  PROTIME-INR     Status: None   Collection Time    08/07/13  6:30 PM      Result Value Range   Prothrombin Time 13.2  11.6 - 15.2 seconds   INR 1.02  0.00 - 1.49  URINALYSIS, ROUTINE W REFLEX MICROSCOPIC     Status: None   Collection Time    08/07/13  6:52 PM      Result Value Range   Color, Urine YELLOW  YELLOW   APPearance CLEAR  CLEAR   Specific Gravity, Urine 1.023  1.005 - 1.030   pH 5.0  5.0 - 8.0   Glucose, UA NEGATIVE  NEGATIVE mg/dL   Hgb urine dipstick NEGATIVE  NEGATIVE   Bilirubin Urine NEGATIVE  NEGATIVE   Ketones, ur NEGATIVE  NEGATIVE mg/dL   Protein, ur NEGATIVE  NEGATIVE mg/dL   Urobilinogen, UA 0.2  0.0 - 1.0 mg/dL   Nitrite NEGATIVE  NEGATIVE   Leukocytes, UA NEGATIVE  NEGATIVE   Comment: MICROSCOPIC NOT DONE ON URINES WITH NEGATIVE PROTEIN, BLOOD, LEUKOCYTES, NITRITE, OR GLUCOSE <1000 mg/dL.  SURGICAL PCR SCREEN     Status: Abnormal   Collection Time    08/08/13 12:41 AM      Result Value Range   MRSA, PCR NEGATIVE  NEGATIVE   Staphylococcus aureus POSITIVE (*) NEGATIVE   Comment:            The Xpert SA Assay (FDA     approved for NASAL specimens     in patients over 40 years of age),     is one component of     a comprehensive surveillance     program.  Test performance has     been validated by  [facility] for patients greater     than or equal to 60 year old.     It is not intended     to diagnose infection nor to     guide or monitor treatment.  CBC     Status: Abnormal   Collection Time    08/08/13  4:27 AM      Result Value Range   WBC 17.9 (*) 4.0 - 10.5 K/uL   RBC 4.91  3.87 - 5.11 MIL/uL   Hemoglobin 14.8  12.0 - 15.0 g/dL   HCT 95.2  84.1 - 32.4 %   MCV 91.0  78.0 - 100.0 fL   MCH 30.1  26.0 - 34.0 pg   MCHC 33.1  30.0 - 36.0 g/dL   RDW 40.1  02.7 - 25.3 %   Platelets 279  150 -  400 K/uL  BASIC METABOLIC PANEL     Status: Abnormal   Collection Time    08/08/13  4:27 AM      Result Value Range   Sodium 137  135 - 145 mEq/L   Potassium 4.3  3.5 - 5.1 mEq/L   Chloride 103  96 - 112 mEq/L   CO2 26  19 - 32 mEq/L   Glucose, Bld 115 (*) 70 - 99 mg/dL   BUN 12  6 - 23 mg/dL   Creatinine, Ser 6.64  0.50 - 1.10 mg/dL   Calcium 8.6  8.4 - 40.3 mg/dL   GFR calc non Af Amer 88 (*) >90 mL/min   GFR calc Af Amer >90  >90 mL/min   Comment: (NOTE)     The eGFR has been calculated using the CKD EPI equation.     This calculation has not been validated in all clinical situations.     eGFR's persistently <90 mL/min signify possible Chronic Kidney     Disease.    Dg Chest 2 View  08/07/2013   CLINICAL DATA:  Lung cancer. Preoperative for hip fracture. Hypertension.  EXAM: CHEST  2 VIEW  COMPARISON:  07/11/2013  FINDINGS: Emphysema and fibrosis. Fiducials in the left upper lobe and left lower lobe with adjacent ill-defined densities similar to prior. Densely calcified mitral valve.  Prior thoracolumbar compression fractures with vertebral augmentation.  IMPRESSION: 1. Emphysema and fibrosis. 2. Stable vague densities in the left upper lobe and left lower lobe, with adjacent fiducial markers. 3. Densely calcified mitral valve. 4. Thoracolumbar compression fractures with prior vertebral augmentations.   Electronically Signed   By: Herbie Baltimore   On: 08/07/2013 19:40   Dg Hip Complete Right  08/07/2013   *RADIOLOGY REPORT*  Clinical Data: Fall, right hip pain  RIGHT HIP - COMPLETE 2+ VIEW  Comparison: None.  Findings: There is a mid-cervical fracture of the right femoral neck.  The femoral head over veins the femoral neck, but there is no fracture line.  This suggests an old, healed subcapital fracture.  The mid-cervical fracture line is well defined.  The shaft fracture fragment has mildly displaced superiorly by 5 mm. There is mild varus angulation.  There is no fracture  comminution. There is apex anterior angulation and more significant anterior displacement measuring just over 1 cm.  There are no other fractures.  The bones are diffusely demineralized.  The left hip prosthesis appears well seated and aligned.  IMPRESSION: Mid-cervical fracture of the right femoral neck, mildly displaced with mild varus angulation as well as apex anterior angulation.  No comminution.  Original Report Authenticated By: Amie Portland, M.D.   Dg Femur Right  08/07/2013   *RADIOLOGY REPORT*  Clinical Data: Fall.  Right hip pain.  RIGHT FEMUR - 2 VIEW  Comparison: None.  Findings: The right mid-cervical femoral neck fractures again noted, described on the right hip radiographs.  There are no additional fractures.  The hip knee joints are normally aligned.  The bones are diffusely demineralized.  IMPRESSION: Mid-cervical fracture of the right femoral neck.  No other fractures.  No dislocations   Original Report Authenticated By: Amie Portland, M.D.    Review of Systems  Constitutional: Negative.   HENT: Negative.   Eyes: Negative.   Respiratory: Negative.   Cardiovascular: Negative.   Gastrointestinal: Negative.   Genitourinary: Negative.   Musculoskeletal: Positive for joint pain.  Skin: Negative.   Neurological: Negative.   Endo/Heme/Allergies: Negative.   Psychiatric/Behavioral: Negative.    Blood pressure 126/65, pulse 88, temperature 98 F (36.7 C), temperature source Oral, resp. rate 18, height 5' (1.524 m), weight 41.5 kg (91 lb 7.9 oz), SpO2 95.00%. Physical Exam  Constitutional: She is oriented to person, place, and time. She appears well-developed and well-nourished.  HENT:  Head: Normocephalic and atraumatic.  Mouth/Throat: Oropharynx is clear and moist.  Eyes: Pupils are equal, round, and reactive to light.  Neck: Neck supple. No JVD present. No tracheal deviation present. No thyromegaly present.  Cardiovascular: Normal rate, regular rhythm and intact distal  pulses.   Respiratory: Effort normal and breath sounds normal. No respiratory distress. She has no wheezes.  GI: Soft. There is no tenderness. There is no guarding.  Musculoskeletal:       Right hip: She exhibits decreased range of motion, decreased strength, tenderness, bony tenderness and swelling. She exhibits no crepitus, no deformity and no laceration.  Lymphadenopathy:    She has no cervical adenopathy.  Neurological: She is alert and oriented to person, place, and time.  Skin: Skin is warm and dry.  Psychiatric: She has a normal mood and affect.    Assessment/Plan: Right hip fracture   No surgery today, will allow to eat and reg diet ordered.   NPO after midnight with anticipation of surgery tomorrow (Fri 8/22). She states that she has almost no pain when she is still, pain meds are ordered Plan would be for an ORIF of the right hip, will obtain consent.      Gerrit Halls 08/08/2013, 12:19 PM

## 2013-08-08 NOTE — Progress Notes (Signed)
Clinical Social Work Department BRIEF PSYCHOSOCIAL ASSESSMENT 08/08/2013  Patient:  [name], [name]     Account Number:  [id number]     Admit date:  08/07/2013  Clinical Social Worker:  Candie Chroman  Date/Time:  08/08/2013 01:09 PM  Referred by:  Physician  Date Referred:  08/08/2013 Referred for  SNF Placement   Other Referral:   Interview type:  Patient Other interview type:    PSYCHOSOCIAL DATA Living Status:  FACILITY Admitted from facility:  [facility] Level of care:  Independent Living Primary support name:  [name] Primary support relationship to patient:  CHILD, ADULT Degree of support available:   supportive    CURRENT CONCERNS Current Concerns  Post-Acute Placement   Other Concerns:    SOCIAL WORK ASSESSMENT / PLAN Pt is a 79 yr old female admitted from [facility]. CSW met with pt to assist with d/c planning. Pt is scheduled for hip surgery 8/22. She would like to return to her independent apt following hospital d/c. Pt is willing to consider [facility] Rehab at [facility] if needed. Explained that PT will provide Eval and recommendations following surgery. [facility] has been contacted and is able to provide SNF bed if needed at d/c.   Assessment/plan status:  No Further Intervention Required Other assessment/ plan:   Home vs SNF   Information/referral to community resources:   Insurance coverage for SNF reviewed.    PATIENT'S/FAMILY'S RESPONSE TO PLAN OF CARE: Pt feels it is too expensive ( insurance co payments ) to go to rehab and would like to return home with therapy. She will accept SNF if recommended by MD / PT.   Cori Razor LCSW [phone]

## 2013-08-08 NOTE — Care Management Note (Signed)
    Page 1 of 1   08/08/2013     11:20:55 AM   CARE MANAGEMENT NOTE 08/08/2013  Patient:  [name], [name]   Account Number:  [id number]  Date Initiated:  08/08/2013  Documentation initiated by:  Inda Castle  Subjective/Objective Assessment:     Action/Plan:   Anticipated DC Date:  08/12/2013   Anticipated DC Plan:  SKILLED NURSING FACILITY         Choice offered to / List presented to:             Status of service:  Completed, signed off Medicare Important Message given?   (If response is "NO", the following Medicare IM given date fields will be blank) Date Medicare IM given:   Date Additional Medicare IM given:    Discharge Disposition:    Per UR Regulation:  Reviewed for med. necessity/level of care/duration of stay  If discussed at Long Length of Stay Meetings, dates discussed:    Comments:  08/08/2013 11:14am  Cristine Polio, RN, BSN, CCDS: Admitted from [facility] with femoral cervical neck fracture. Anticipate ORIF vs arthroplasty, awaiting orthopedic consult.  Currently on bed rest. Anticipate Dc to SNF level of care which will be facilitated by clinical social worker. No care management needs.

## 2013-08-08 NOTE — Progress Notes (Signed)
INITIAL NUTRITION ASSESSMENT  DOCUMENTATION CODES Per approved criteria  -Underweight   INTERVENTION: Provide Carnation Instant breakfast once daily Provide Resource Breeze once daily Encourage PO intake  NUTRITION DIAGNOSIS: Increased nutrient needs related to underweight as evidenced by BMI of 17.9.   Goal: Pt to meet >/= 90% of their estimated nutrition needs  Monitor:  PO intake Weight Labs  Reason for Assessment: Malnutrition Screening Tool, score of 3  79 y.o. female  Admitting Dx: Hip fracture, right  ASSESSMENT: 79 year old female with a history of hypertension, lung cancer who presented the emergency department with right-sided hip pain after a fall.  Pt reports that she didn't eat anything for 2 days PTA and has had very little to eat today due to poor appetite and pain. Pt denies wt loss stating  that she usually weighs 88-90 lbs and weighed up 102 lbs many years ago. Pt states she has tried many nutritional supplements and they all cause nausea or GI issues. Pt is willing to try carnation instant breakfast and resource breeze supplements during admission. Pt states she usually eats 4 meals daily with protein-rich foods throughout the day; encouraged pt to continue.   Height: Ht Readings from Last 1 Encounters:  08/07/13 5' (1.524 m)    Weight: Wt Readings from Last 1 Encounters:  08/07/13 91 lb 7.9 oz (41.5 kg)    Ideal Body Weight: 100 lbs  % Ideal Body Weight: 91%  Wt Readings from Last 10 Encounters:  08/07/13 91 lb 7.9 oz (41.5 kg)  07/08/13 91 lb 12.8 oz (41.64 kg)  07/02/13 91 lb 3.2 oz (41.368 kg)  12/01/12 79 lb (35.834 kg)  12/01/12 79 lb (35.834 kg)  11/30/12 79 lb (35.834 kg)  10/07/12 88 lb (39.917 kg)  10/07/12 88 lb (39.917 kg)  10/01/12 89 lb (40.37 kg)  02/03/12 88 lb 14.4 oz (40.325 kg)    Usual Body Weight: 88-90 lbs  % Usual Body Weight: 101%  BMI:  Body mass index is 17.87 kg/(m^2).  Estimated Nutritional Needs: Kcal:  1300-1450 Protein: 50-60 grams Fluid: >/=1.2 L  Skin: intact  Diet Order: General  EDUCATION NEEDS: -No education needs identified at this time   Intake/Output Summary (Last 24 hours) at 08/08/13 1530 Last data filed at 08/08/13 1300  Gross per 24 hour  Intake 814.16 ml  Output   1200 ml  Net -385.84 ml    Last BM: 8/18  Labs:   Recent Labs Lab 08/07/13 1830 08/08/13 0427  NA 135 137  K 3.8 4.3  CL 101 103  CO2 21 26  BUN 20 12  CREATININE 0.53 0.55  CALCIUM 9.3 8.6  GLUCOSE 113* 115*    CBG (last 3)  No results found for this basename: GLUCAP,  in the last 72 hours  Scheduled Meds: . amLODipine  10 mg Oral Daily  . bisoprolol  7.5 mg Oral Daily  . budesonide-formoterol  2 puff Inhalation BID  . fentaNYL  50 mcg Transdermal Q72H  . heparin  5,000 Units Subcutaneous Q8H  . levothyroxine  25 mcg Oral QAC breakfast    Continuous Infusions: . sodium chloride 50 mL/hr at 08/08/13 0700    Past Medical History  Diagnosis Date  . lung ca dx'd 08/2010    xrt comp 10/2010  . Hypertension   . Lung cancer     LUL, LLL lung  . Osteoporosis   . Diverticulitis   . Chronic back pain     Past Surgical History  Procedure  Laterality Date  . Hip arthroplasty  10/10/2012    Procedure: ARTHROPLASTY BIPOLAR HIP;  Surgeon: Shelda Pal, MD;  Location: [facility];  Service: Orthopedics;  Laterality: Left;  . Orif periprosthetic fracture Left 12/03/2012    Procedure: OPEN REDUCTION INTERNAL FIXATION (ORIF) PERIPROSTHETIC FRACTURE;  Surgeon: Shelda Pal, MD;  Location: [facility];  Service: Orthopedics;  Laterality: Left;    Ian Malkin RD, LDN Inpatient Clinical Dietitian Pager: [phone] After Hours Pager: [phone]

## 2013-08-09 ENCOUNTER — Encounter (HOSPITAL_COMMUNITY)
Admission: EM | Disposition: A | Discharge status: Skilled Nursing Facility | Payer: Self-pay | Source: Home / Self Care | Attending: Family Medicine

## 2013-08-09 ENCOUNTER — Inpatient Hospital Stay (HOSPITAL_COMMUNITY): Payer: Medicare Other

## 2013-08-09 ENCOUNTER — Inpatient Hospital Stay (HOSPITAL_COMMUNITY): Payer: Medicare Other | Admitting: Anesthesiology

## 2013-08-09 ENCOUNTER — Encounter (HOSPITAL_COMMUNITY): Payer: Self-pay | Admitting: Anesthesiology

## 2013-08-09 HISTORY — PX: HIP ARTHROPLASTY: SHX981

## 2013-08-09 SURGERY — HEMIARTHROPLASTY, HIP, DIRECT ANTERIOR APPROACH, FOR FRACTURE
Anesthesia: Spinal | Site: Hip | Laterality: Right | Wound class: Clean

## 2013-08-09 MED ORDER — POLYETHYLENE GLYCOL 3350 17 G PO PACK
17.0000 g | PACK | Freq: Every day | ORAL | Status: DC | PRN
  Administered 2013-08-10 – 2013-08-11 (×2): 17 g via ORAL

## 2013-08-09 MED ORDER — METHOCARBAMOL 100 MG/ML IJ SOLN
500.0000 mg | Freq: Four times a day (QID) | INTRAVENOUS | Status: DC | PRN
  Administered 2013-08-09: 500 mg via INTRAVENOUS
  Filled 2013-08-09: qty 5

## 2013-08-09 MED ORDER — FENTANYL CITRATE 0.05 MG/ML IJ SOLN: 25.0000 ug | INTRAMUSCULAR | Status: DC | PRN

## 2013-08-09 MED ORDER — PROMETHAZINE HCL 25 MG/ML IJ SOLN: 6.2500 mg | INTRAMUSCULAR | Status: DC | PRN

## 2013-08-09 MED ORDER — ONDANSETRON HCL 4 MG/2ML IJ SOLN
4.0000 mg | Freq: Four times a day (QID) | INTRAMUSCULAR | Status: DC | PRN
  Administered 2013-08-09: 4 mg via INTRAVENOUS
  Filled 2013-08-09: qty 2

## 2013-08-09 MED ORDER — EPHEDRINE SULFATE 50 MG/ML IJ SOLN
INTRAMUSCULAR | Status: DC | PRN
  Administered 2013-08-09: 5 mg via INTRAVENOUS

## 2013-08-09 MED ORDER — KETAMINE HCL 50 MG/ML IJ SOLN
INTRAMUSCULAR | Status: DC | PRN
  Administered 2013-08-09: 30 mg via INTRAMUSCULAR

## 2013-08-09 MED ORDER — PHENYLEPHRINE HCL 10 MG/ML IJ SOLN
INTRAMUSCULAR | Status: DC | PRN
  Administered 2013-08-09: 40 ug via INTRAVENOUS

## 2013-08-09 MED ORDER — PHENOL 1.4 % MT LIQD: 1.0000 | OROMUCOSAL | Status: DC | PRN

## 2013-08-09 MED ORDER — LACTATED RINGERS IV SOLN
INTRAVENOUS | Status: DC
  Administered 2013-08-09 (×3): via INTRAVENOUS

## 2013-08-09 MED ORDER — ENOXAPARIN SODIUM 30 MG/0.3ML ~~LOC~~ SOLN
30.0000 mg | SUBCUTANEOUS | Status: DC
  Administered 2013-08-10 – 2013-08-12 (×3): 30 mg via SUBCUTANEOUS
  Filled 2013-08-09 (×4): qty 0.3

## 2013-08-09 MED ORDER — 0.9 % SODIUM CHLORIDE (POUR BTL) OPTIME
TOPICAL | Status: DC | PRN
  Administered 2013-08-09: 1000 mL

## 2013-08-09 MED ORDER — METOCLOPRAMIDE HCL 5 MG/ML IJ SOLN: 5.0000 mg | Freq: Three times a day (TID) | INTRAMUSCULAR | Status: DC | PRN

## 2013-08-09 MED ORDER — CEFAZOLIN SODIUM 1-5 GM-% IV SOLN
1.0000 g | Freq: Four times a day (QID) | INTRAVENOUS | Status: AC
  Administered 2013-08-10 (×2): 1 g via INTRAVENOUS
  Filled 2013-08-09 (×2): qty 50

## 2013-08-09 MED ORDER — ACETAMINOPHEN 650 MG RE SUPP: 650.0000 mg | Freq: Four times a day (QID) | RECTAL | Status: DC | PRN

## 2013-08-09 MED ORDER — MENTHOL 3 MG MT LOZG: 1.0000 | LOZENGE | OROMUCOSAL | Status: DC | PRN

## 2013-08-09 MED ORDER — MEPERIDINE HCL 50 MG/ML IJ SOLN: 6.2500 mg | INTRAMUSCULAR | Status: DC | PRN

## 2013-08-09 MED ORDER — PHENYLEPHRINE HCL 10 MG/ML IJ SOLN
10.0000 mg | INTRAVENOUS | Status: DC | PRN
  Administered 2013-08-09: 50 ug/min via INTRAVENOUS

## 2013-08-09 MED ORDER — DOCUSATE SODIUM 100 MG PO CAPS
100.0000 mg | ORAL_CAPSULE | Freq: Two times a day (BID) | ORAL | Status: DC
  Administered 2013-08-09 – 2013-08-12 (×6): 100 mg via ORAL

## 2013-08-09 MED ORDER — STERILE WATER FOR IRRIGATION IR SOLN
Status: DC | PRN
  Administered 2013-08-09: 1500 mL

## 2013-08-09 MED ORDER — ONDANSETRON HCL 4 MG PO TABS
4.0000 mg | ORAL_TABLET | Freq: Four times a day (QID) | ORAL | Status: DC | PRN
  Administered 2013-08-12: 4 mg via ORAL
  Filled 2013-08-09: qty 1

## 2013-08-09 MED ORDER — HYDROMORPHONE HCL PF 1 MG/ML IJ SOLN: 0.5000 mg | INTRAMUSCULAR | Status: DC | PRN

## 2013-08-09 MED ORDER — FERROUS SULFATE 325 (65 FE) MG PO TABS
325.0000 mg | ORAL_TABLET | Freq: Two times a day (BID) | ORAL | Status: DC
  Administered 2013-08-10 – 2013-08-12 (×5): 325 mg via ORAL
  Filled 2013-08-09 (×7): qty 1

## 2013-08-09 MED ORDER — PROPOFOL INFUSION 10 MG/ML OPTIME
INTRAVENOUS | Status: DC | PRN
  Administered 2013-08-09: 25 ug/kg/min via INTRAVENOUS

## 2013-08-09 MED ORDER — ACETAMINOPHEN 325 MG PO TABS: 650.0000 mg | ORAL_TABLET | Freq: Four times a day (QID) | ORAL | Status: DC | PRN

## 2013-08-09 MED ORDER — METOCLOPRAMIDE HCL 10 MG PO TABS
5.0000 mg | ORAL_TABLET | Freq: Three times a day (TID) | ORAL | Status: DC | PRN
  Administered 2013-08-12: 10 mg via ORAL
  Filled 2013-08-09: qty 1

## 2013-08-09 MED ORDER — SODIUM CHLORIDE 0.9 % IV SOLN
INTRAVENOUS | Status: DC
  Administered 2013-08-09: 23:00:00 via INTRAVENOUS
  Filled 2013-08-09 (×8): qty 1000

## 2013-08-09 MED ORDER — CEFAZOLIN SODIUM-DEXTROSE 2-3 GM-% IV SOLR
INTRAVENOUS | Status: DC | PRN
  Administered 2013-08-09: 2 g via INTRAVENOUS

## 2013-08-09 MED ORDER — METHOCARBAMOL 500 MG PO TABS
500.0000 mg | ORAL_TABLET | Freq: Four times a day (QID) | ORAL | Status: DC | PRN
  Administered 2013-08-10 – 2013-08-12 (×5): 500 mg via ORAL
  Filled 2013-08-09 (×5): qty 1

## 2013-08-09 MED ORDER — BUPIVACAINE HCL (PF) 0.5 % IJ SOLN
INTRAMUSCULAR | Status: DC | PRN
  Administered 2013-08-09: 3 mL

## 2013-08-09 SURGICAL SUPPLY — 49 items
ADH SKN CLS APL DERMABOND .7 (GAUZE/BANDAGES/DRESSINGS) ×1
BAG SPEC THK2 15X12 ZIP CLS (MISCELLANEOUS)
BAG ZIPLOCK 12X15 (MISCELLANEOUS) IMPLANT
BLADE SAW SGTL 18X1.27X75 (BLADE) ×2 IMPLANT
CAPT HIP FX BIPOLAR/UNIPOLAR ×1 IMPLANT
CLOTH BEACON ORANGE TIMEOUT ST (SAFETY) ×2 IMPLANT
DERMABOND ADVANCED (GAUZE/BANDAGES/DRESSINGS) ×1
DERMABOND ADVANCED .7 DNX12 (GAUZE/BANDAGES/DRESSINGS) ×1 IMPLANT
DRAPE INCISE IOBAN 85X60 (DRAPES) ×2 IMPLANT
DRAPE ORTHO SPLIT 77X108 STRL (DRAPES) ×4
DRAPE POUCH INSTRU U-SHP 10X18 (DRAPES) ×2 IMPLANT
DRAPE SURG 17X11 SM STRL (DRAPES) ×2 IMPLANT
DRAPE SURG ORHT 6 SPLT 77X108 (DRAPES) ×2 IMPLANT
DRAPE U-SHAPE 47X51 STRL (DRAPES) ×2 IMPLANT
DRSG AQUACEL AG ADV 3.5X10 (GAUZE/BANDAGES/DRESSINGS) ×2 IMPLANT
DRSG TEGADERM 4X4.75 (GAUZE/BANDAGES/DRESSINGS) ×1 IMPLANT
DURAPREP 26ML APPLICATOR (WOUND CARE) ×2 IMPLANT
ELECT BLADE TIP CTD 4 INCH (ELECTRODE) ×2 IMPLANT
ELECT REM PT RETURN 9FT ADLT (ELECTROSURGICAL) ×2
ELECTRODE REM PT RTRN 9FT ADLT (ELECTROSURGICAL) ×1 IMPLANT
EVACUATOR 1/8 PVC DRAIN (DRAIN) ×2 IMPLANT
FACESHIELD LNG OPTICON STERILE (SAFETY) ×8 IMPLANT
GAUZE SPONGE 2X2 8PLY STRL LF (GAUZE/BANDAGES/DRESSINGS) ×1 IMPLANT
GLOVE BIOGEL PI IND STRL 7.5 (GLOVE) ×1 IMPLANT
GLOVE BIOGEL PI IND STRL 8 (GLOVE) ×1 IMPLANT
GLOVE BIOGEL PI INDICATOR 7.5 (GLOVE) ×1
GLOVE BIOGEL PI INDICATOR 8 (GLOVE)
GLOVE ECLIPSE 8.0 STRL XLNG CF (GLOVE) ×1 IMPLANT
GLOVE ORTHO TXT STRL SZ7.5 (GLOVE) ×4 IMPLANT
GLOVE SURG ORTHO 8.0 STRL STRW (GLOVE) ×2 IMPLANT
GOWN BRE IMP PREV XXLGXLNG (GOWN DISPOSABLE) ×4 IMPLANT
GOWN STRL NON-REIN LRG LVL3 (GOWN DISPOSABLE) ×2 IMPLANT
HANDPIECE INTERPULSE COAX TIP (DISPOSABLE)
IMMOBILIZER KNEE 20 (SOFTGOODS) ×2
IMMOBILIZER KNEE 20 THIGH 36 (SOFTGOODS) IMPLANT
KIT BASIN OR (CUSTOM PROCEDURE TRAY) ×2 IMPLANT
MANIFOLD NEPTUNE II (INSTRUMENTS) ×2 IMPLANT
PACK TOTAL JOINT (CUSTOM PROCEDURE TRAY) ×2 IMPLANT
POSITIONER SURGICAL ARM (MISCELLANEOUS) ×2 IMPLANT
SET HNDPC FAN SPRY TIP SCT (DISPOSABLE) IMPLANT
SPONGE GAUZE 2X2 STER 10/PKG (GAUZE/BANDAGES/DRESSINGS) ×1
STRIP CLOSURE SKIN 1/2X4 (GAUZE/BANDAGES/DRESSINGS) ×4 IMPLANT
SUT ETHIBOND NAB CT1 #1 30IN (SUTURE) ×1 IMPLANT
SUT MNCRL AB 4-0 PS2 18 (SUTURE) ×2 IMPLANT
SUT VIC AB 1 CT1 36 (SUTURE) ×5 IMPLANT
SUT VIC AB 2-0 CT1 27 (SUTURE) ×6
SUT VIC AB 2-0 CT1 TAPERPNT 27 (SUTURE) ×2 IMPLANT
TOWEL OR 17X26 10 PK STRL BLUE (TOWEL DISPOSABLE) ×4 IMPLANT
TRAY FOLEY CATH 14FRSI W/METER (CATHETERS) ×2 IMPLANT

## 2013-08-09 NOTE — Transfer of Care (Signed)
Immediate Anesthesia Transfer of Care Note  Patient: [name]  Procedure(s) Performed: Procedure(s) (LRB): RIGHT HIP HEMI-ARTHROPLASTY (Right)  Patient Location: PACU  Anesthesia Type: Spinal  Level of Consciousness: sedated, patient cooperative and responds to stimulaton  Airway & Oxygen Therapy: Patient Spontanous Breathing and Patient connected to face mask oxgen  Post-op Assessment: Report given to PACU RN and Post -op Vital signs reviewed and stable  Post vital signs: Reviewed and stable T-12 level on release to PACU staff.   Complications: No apparent anesthesia complications

## 2013-08-09 NOTE — Progress Notes (Signed)
   Subjective:   Procedure(s) (LRB): RIGHT HIP HEMI-ARTHROPLASTY (Right)   Patient reports pain as mild, pain controlled. Minimal pain when just resting in bed. No change in condition and is ready for surgery today. Feels a little constipated, but doesn't want to have issues with bowels during surgery.  Objective:   VITALS:   Filed Vitals:   08/09/13  BP: 129/72  Pulse: 74  Temp: 98.2 F (36.8 C)   Resp: 16    Neurovascular intact Dorsiflexion/Plantar flexion intact No cellulitis present Compartment soft  LABS  Recent Labs  08/07/13 1830 08/08/13 0427  HGB 15.2* 14.8  HCT 44.1 44.7  WBC 25.3* 17.9*  PLT 305 279     Recent Labs  08/07/13 1830 08/08/13 0427  NA 135 137  K 3.8 4.3  BUN 20 12  CREATININE 0.53 0.55  GLUCOSE 113* 115*     Assessment/Plan:   Procedure(s) (LRB): RIGHT HIP HEMI-ARTHROPLASTY (Right)  NPO now Surgery later today, possibly at about 1500 No change in condition and ready to get her hip fixed    [name]   PAC  08/09/2013, 9:19 AM   

## 2013-08-09 NOTE — Anesthesia Postprocedure Evaluation (Signed)
  Anesthesia Post-op Note  Patient: [name]  Procedure(s) Performed: Procedure(s) (LRB): RIGHT HIP HEMI-ARTHROPLASTY (Right)  Patient Location: PACU  Anesthesia Type: Spinal  Level of Consciousness: awake and alert   Airway and Oxygen Therapy: Patient Spontanous Breathing  Post-op Pain: mild  Post-op Assessment: Post-op Vital signs reviewed, Patient's Cardiovascular Status Stable, Respiratory Function Stable, Patent Airway and No signs of Nausea or vomiting  Last Vitals:  Filed Vitals:   08/09/13 2031  BP: 136/70  Pulse: 64  Temp: 36.6 C  Resp: 20    Post-op Vital Signs: stable   Complications: No apparent anesthesia complications

## 2013-08-09 NOTE — Progress Notes (Signed)
[facility] PROGRESS NOTE  [name] ZOX:[id number] DOB: [birth year] DOA: 08/07/2013 PCP: [name], MD  Assessment/Plan: 1. Right hip fx - Ortho on board and managing - Plan is for operation on 8/22 - Most likely cause of leukocytosis  2. HTN - Last blood pressure 129/72 - At this point will continue to monitor - Will continue bisoprolol, amlodipine  3. Hypothyroidism - Continue Synthroid  4. COPD - stable currently continue current regimen  5. Adenocarcinoma lung s/p radiation therapy: stable issue   Code Status: full Family Communication: no family at bedside.  Disposition Plan: Pending further recommendations from orthopaedic surgeon   Consultants:  Ortho  Procedures:  None currently  Antibiotics:  None  HPI/Subjective: Pt has no new complaints. Pain is currently tolerable.   Objective: Filed Vitals:   08/09/13 1200  BP:   Pulse:   Temp:   Resp: 15    Intake/Output Summary (Last 24 hours) at 08/09/13 1529 Last data filed at 08/09/13 1123  Gross per 24 hour  Intake    120 ml  Output    825 ml  Net   -705 ml   Filed Weights   08/07/13 2243  Weight: 41.5 kg (91 lb 7.9 oz)    Exam:   General:  Pt in NAD, Alert and Awake  Cardiovascular: RRR, no MRG  Respiratory: CTA BL, no wheezes  Abdomen: soft, NT, ND  Musculoskeletal: pain at right hip.   Data Reviewed: Basic Metabolic Panel:  Recent Labs Lab 08/07/13 1830 08/08/13 0427  NA 135 137  K 3.8 4.3  CL 101 103  CO2 21 26  GLUCOSE 113* 115*  BUN 20 12  CREATININE 0.53 0.55  CALCIUM 9.3 8.6   Liver Function Tests: No results found for this basename: AST, ALT, ALKPHOS, BILITOT, PROT, ALBUMIN,  in the last 168 hours No results found for this basename: LIPASE, AMYLASE,  in the last 168 hours No results found for this basename: AMMONIA,  in the last 168 hours CBC:  Recent Labs Lab 08/07/13 1830 08/08/13 0427  WBC 25.3* 17.9*  NEUTROABS 22.1*  --    HGB 15.2* 14.8  HCT 44.1 44.7  MCV 89.8 91.0  PLT 305 279   Cardiac Enzymes: No results found for this basename: CKTOTAL, CKMB, CKMBINDEX, TROPONINI,  in the last 168 hours BNP (last 3 results)  Recent Labs  07/11/13 1619  PROBNP 270.7   CBG: No results found for this basename: GLUCAP,  in the last 168 hours  Recent Results (from the past 240 hour(s))  SURGICAL PCR SCREEN     Status: Abnormal   Collection Time    08/08/13 12:41 AM      Result Value Range Status   MRSA, PCR NEGATIVE  NEGATIVE Final   Staphylococcus aureus POSITIVE (*) NEGATIVE Final   Comment:            The Xpert SA Assay (FDA     approved for NASAL specimens     in patients over 77 years of age),     is one component of     a comprehensive surveillance     program.  Test performance has     been validated by [employer] for patients greater     than or equal to 52 year old.     It is not intended     to diagnose infection nor to     guide or monitor treatment.  Studies: Dg Chest 2 View  08/07/2013   CLINICAL DATA:  Lung cancer. Preoperative for hip fracture. Hypertension.  EXAM: CHEST  2 VIEW  COMPARISON:  07/11/2013  FINDINGS: Emphysema and fibrosis. Fiducials in the left upper lobe and left lower lobe with adjacent ill-defined densities similar to prior. Densely calcified mitral valve.  Prior thoracolumbar compression fractures with vertebral augmentation.  IMPRESSION: 1. Emphysema and fibrosis. 2. Stable vague densities in the left upper lobe and left lower lobe, with adjacent fiducial markers. 3. Densely calcified mitral valve. 4. Thoracolumbar compression fractures with prior vertebral augmentations.   Electronically Signed   By: Herbie Baltimore   On: 08/07/2013 19:40   Dg Hip Complete Right  08/07/2013   *RADIOLOGY REPORT*  Clinical Data: Fall, right hip pain  RIGHT HIP - COMPLETE 2+ VIEW  Comparison: None.  Findings: There is a mid-cervical fracture of the right femoral neck.  The femoral  head over veins the femoral neck, but there is no fracture line.  This suggests an old, healed subcapital fracture.  The mid-cervical fracture line is well defined.  The shaft fracture fragment has mildly displaced superiorly by 5 mm. There is mild varus angulation.  There is no fracture comminution. There is apex anterior angulation and more significant anterior displacement measuring just over 1 cm.  There are no other fractures.  The bones are diffusely demineralized.  The left hip prosthesis appears well seated and aligned.  IMPRESSION: Mid-cervical fracture of the right femoral neck, mildly displaced with mild varus angulation as well as apex anterior angulation.  No comminution.   Original Report Authenticated By: Amie Portland, M.D.   Dg Femur Right  08/07/2013   *RADIOLOGY REPORT*  Clinical Data: Fall.  Right hip pain.  RIGHT FEMUR - 2 VIEW  Comparison: None.  Findings: The right mid-cervical femoral neck fractures again noted, described on the right hip radiographs.  There are no additional fractures.  The hip knee joints are normally aligned.  The bones are diffusely demineralized.  IMPRESSION: Mid-cervical fracture of the right femoral neck.  No other fractures.  No dislocations   Original Report Authenticated By: Amie Portland, M.D.    Scheduled Meds: . [MAR HOLD] amLODipine  10 mg Oral Daily  . [facility] HOLD] bisoprolol  7.5 mg Oral Daily  . [MAR HOLD] budesonide-formoterol  2 puff Inhalation BID  . [MAR HOLD] feeding supplement  1 Container Oral Q24H  . [facility] HOLD] fentaNYL  50 mcg Transdermal Q72H  . [MAR HOLD] heparin  5,000 Units Subcutaneous Q8H  . [facility] HOLD] levothyroxine  25 mcg Oral QAC breakfast   Continuous Infusions: . sodium chloride 50 mL/hr at 08/08/13 1943  . lactated ringers      Principal Problem:   Hip fracture, right Active Problems:   Goiter, unspecified   HYPERLIPIDEMIA   DEPRESSION   HYPERTENSION   C O P D WITH ACUTE EXACERBATION   C O P D   PULMONARY NODULE    HTN (hypertension)   Chronic back pain    Time spent: > 35 minutes    Penny Pia  [facility] Pager [phone] If 7PM-7AM, please contact night-coverage at [email or web address], password [facility] 08/09/2013, 3:29 PM  LOS: 2 days

## 2013-08-09 NOTE — Op Note (Signed)
NAME: [name], [name] NO.:  [id number]   MEDICAL RECORD NO.: [id number]   LOCATION:  1435                         FACILITY:  [facility]   DATE OF BIRTH:  11-07-1932  PHYSICIAN:  Madlyn Frankel. Charlann Boxer, M.D.     DATE OF PROCEDURE:  08/09/2013                               OPERATIVE REPORT     PREOPERATIVE DIAGNOSIS:  Right displaced femoral neck fracture.   POSTOPERATIVE DIAGNOSIS:  Right displaced femoral neck fracture.   PROCEDURE:  Right hip hemiarthroplasty utilizing DePuy component, size 6 standard Summit Basic stem with a 45mm unipolar ball with a +0 adapter.   SURGEON:  Madlyn Frankel. Charlann Boxer, MD   ASSISTANT:  Lanney Gins, PA-C.   ANESTHESIA:  General.   SPECIMENS:  None.   DRAINS:  One medium Hemovac.   BLOOD LOSS:  About <100 cc.   COMPLICATIONS:  None.   INDICATION OF PROCEDURE:  [name] is a pleasant 79 year old female who lives independently.  She unfortunately had a fall at her house when she missed her seat while trying to sit down on her sofa.  She was admitted to the hospital after radiographs revealed a femoral neck fracture.  She was seen and evaluated and was scheduled for surgery for fixation.  The necessity of surgical repair was discussed with she and her family.  Consent was obtained after reviewing risks of infection, DVT, component failure, and need for revision surgery.   PROCEDURE IN DETAIL:  The patient was brought to the operative theater. Once adequate anesthesia, preoperative antibiotics, 2 g of Ancef administered, the patient was positioned into the left lateral decubitus position with the right side up.  The right lower extremity was then prepped and draped in sterile fashion.  A time-out was performed identifying the patient, planned procedure, and extremity.   A lateral incision was made off the proximal trochanter. Sharp dissection was carried down to the iliotibial band and gluteal fascia. The gluteal fascia was  then incised for posterior approach.  The short external rotators were taken down separate from the posterior capsule. An L capsulotomy was made preserving the posterior leaflet for later anatomic repair. Fracture site was identified and after removing comminuted segments of the posterior femoral neck, the femoral head was removed without difficulty and measured on the back table  using the sizing rings and determined to be 45 mm in diameter.   The proximal femur was then exposed.  Retractors placed.  I then drilled, opened the proximal femur.  Then I hand reamed once and  Irrigated the canal to try to prevent fat emboli.  I began broaching the femur with a 6 broach up to a size 6 broach with good medial and lateral metaphyseal fit without evidence of any torsion or movement.  A trial reduction was carried out with a standard neck and a +0 adapter with a 45mm ball.  The hip reduced nicely.  The leg lengths appeared to be equal compared to the down leg.   The hip went through a range of motion without evidence of any subluxation or impingement.   Given these findings, the trial components removed.  The final 6 standard Summit  Basic stem was opened.  After irrigating the canal, the final stem was impacted and sat at the level where the broach was. Based on this and the trial reduction, a +0 adapter was opened and impacted in the 45 unipolar ball onto a clean and dry trunnion.  The hip had been irrigated throughout the case and again at this point.  I re- Approximated the posterior capsule to the superior leaflet using a  #1 Vicryl,  and placed a medium Hemovac drain deep.  The remainder of the wound was closed with #1 Vicryl in the iliotibial band and gluteal fascia, a  2-0 Vicryl in the sub-Q tissue and a running 4-0 Monocryl in the skin.  The hip was cleaned, dried, and dressed sterilely using Dermabond and Aquacel dressing.  Drain site was dressed separately.  She was then brought to  recovery room, extubated in stable condition, tolerating the procedure well.  Lanney Gins, PA-C was present and utilized as Geophysicist/field seismologist for the entire case from  Preoperative positioning to management of the contralateral extremity and retractors to  General facilitation of the procedure.  He was also involved with primary wound closure.         Madlyn Frankel Charlann Boxer, M.D.

## 2013-08-09 NOTE — H&P (View-Only) (Signed)
   Subjective:   Procedure(s) (LRB): RIGHT HIP HEMI-ARTHROPLASTY (Right)   Patient reports pain as mild, pain controlled. Minimal pain when just resting in bed. No change in condition and is ready for surgery today. Feels a little constipated, but doesn't want to have issues with bowels during surgery.  Objective:   VITALS:   Filed Vitals:   08/09/13  BP: 129/72  Pulse: 74  Temp: 98.2 F (36.8 C)   Resp: 16    Neurovascular intact Dorsiflexion/Plantar flexion intact No cellulitis present Compartment soft  LABS  Recent Labs  08/07/13 1830 08/08/13 0427  HGB 15.2* 14.8  HCT 44.1 44.7  WBC 25.3* 17.9*  PLT 305 279     Recent Labs  08/07/13 1830 08/08/13 0427  NA 135 137  K 3.8 4.3  BUN 20 12  CREATININE 0.53 0.55  GLUCOSE 113* 115*     Assessment/Plan:   Procedure(s) (LRB): RIGHT HIP HEMI-ARTHROPLASTY (Right)  NPO now Surgery later today, possibly at about 1500 No change in condition and ready to get her hip fixed    Anastasio Auerbach. Emric Kowalewski   PAC  08/09/2013, 9:19 AM

## 2013-08-09 NOTE — Anesthesia Preprocedure Evaluation (Signed)
Anesthesia Evaluation  Patient identified by MRN, date of birth, ID band Patient awake    Reviewed: Allergy & Precautions, H&P , NPO status , Patient's Chart, lab work & pertinent test results  Airway Mallampati: II TM Distance: >3 FB Neck ROM: Full    Dental  (+) Edentulous Upper and Edentulous Lower   Pulmonary shortness of breath and with exertion, asthma , pneumonia -, COPD COPD inhaler, Current Smoker, former smoker,  Hx of lung CA breath sounds clear to auscultation- rhonchi  Pulmonary exam normal + decreased breath sounds      Cardiovascular hypertension, Pt. on medications and Pt. on home beta blockers Rhythm:Regular Rate:Normal     Neuro/Psych Depression Chronic low back pain negative neurological ROS     GI/Hepatic negative GI ROS, Neg liver ROS,   Endo/Other  Hypothyroidism   Renal/GU negative Renal ROS  negative genitourinary   Musculoskeletal negative musculoskeletal ROS (+)   Abdominal (+) + scaphoid   Peds negative pediatric ROS (+)  Hematology negative hematology ROS (+)   Anesthesia Other Findings   Reproductive/Obstetrics negative OB ROS                           Anesthesia Physical  Anesthesia Plan  ASA: III  Anesthesia Plan: Spinal   Post-op Pain Management:    Induction:   Airway Management Planned:   Additional Equipment:   Intra-op Plan:   Post-operative Plan:   Informed Consent: I have reviewed the patients History and Physical, chart, labs and discussed the procedure including the risks, benefits and alternatives for the proposed anesthesia with the patient or authorized representative who has indicated his/her understanding and acceptance.   Dental advisory given  Plan Discussed with: CRNA  Anesthesia Plan Comments:         Anesthesia Quick Evaluation

## 2013-08-09 NOTE — Anesthesia Procedure Notes (Signed)
Spinal  Patient location during procedure: OR Start time: 08/09/2013 4:30 PM End time: 08/09/2013 4:35 PM Staffing CRNA/Resident: Paris Lore Performed by: resident/CRNA  Preanesthetic Checklist Completed: patient identified, site marked, surgical consent, pre-op evaluation, timeout performed, IV checked, risks and benefits discussed and monitors and equipment checked Spinal Block Patient position: left lateral decubitus Prep: Betadine Patient monitoring: heart rate, continuous pulse ox and blood pressure Approach: left paramedian Location: L2-3 Injection technique: single-shot Needle Needle type: Spinocan  Needle gauge: 22 G Needle length: 9 cm Needle insertion depth: 3 cm Assessment Sensory level: T4 Additional Notes Expiration date of kit checked and confirmed. Patient tolerated procedure well, without complications. Left lateral X 1 attempt with noted CSF return clear and easy aspiration and administration of medication.  Noted T4 level on assessment.

## 2013-08-09 NOTE — Interval H&P Note (Signed)
History and Physical Interval Note:  08/09/2013 4:14 PM  [name]  has presented today for surgery, with the diagnosis of RIGHT HIP FRACTURE  The various methods of treatment have been discussed with the patient and family. After consideration of risks, benefits and other options for treatment, the patient has consented to  Procedure(s): RIGHT HIP HEMI-ARTHROPLASTY (Right) as a surgical intervention .  The patient's history has been reviewed, patient examined, no change in status, stable for surgery.  I have reviewed the patient's chart and labs.  Questions were answered to the patient's satisfaction.     Shelda Pal

## 2013-08-10 DIAGNOSIS — J309 Allergic rhinitis, unspecified: Secondary | ICD-10-CM

## 2013-08-10 DIAGNOSIS — R0789 Other chest pain: Secondary | ICD-10-CM

## 2013-08-10 DIAGNOSIS — J189 Pneumonia, unspecified organism: Secondary | ICD-10-CM

## 2013-08-10 DIAGNOSIS — A0472 Enterocolitis due to Clostridium difficile, not specified as recurrent: Secondary | ICD-10-CM

## 2013-08-10 DIAGNOSIS — E860 Dehydration: Secondary | ICD-10-CM

## 2013-08-10 LAB — BASIC METABOLIC PANEL
Chloride: 106 mEq/L (ref 96–112)
GFR calc Af Amer: >90 mL/min (ref >90)
GFR calc non Af Amer: 90 mL/min — ABNORMAL LOW (ref >90)
Potassium: 3.9 mEq/L (ref 3.5–5.1)
Sodium: 137 mEq/L (ref 135–145)

## 2013-08-10 LAB — CBC
MCHC: 33.9 g/dL (ref 30.0–36.0)
Platelets: 230 10*3/uL (ref 150–400)
RDW: 13.8 % (ref 11.5–15.5)
WBC: 15.2 10*3/uL — ABNORMAL HIGH (ref 4.0–10.5)

## 2013-08-10 MED ORDER — ENOXAPARIN SODIUM 30 MG/0.3ML ~~LOC~~ SOLN: 30.0000 mg | SUBCUTANEOUS | Status: DC

## 2013-08-10 MED ORDER — GI COCKTAIL ~~LOC~~
30.0000 mL | Freq: Three times a day (TID) | ORAL | Status: DC | PRN
  Administered 2013-08-10: 30 mL via ORAL
  Filled 2013-08-10: qty 30

## 2013-08-10 MED ORDER — HYDROCODONE-ACETAMINOPHEN 5-325 MG PO TABS: 1.0000 | ORAL_TABLET | ORAL | Status: DC | PRN

## 2013-08-10 NOTE — Progress Notes (Signed)
[facility] PROGRESS NOTE  Collie Kittel Mcgivern QMV:[id number] DOB: [birth year] DOA: 08/07/2013 PCP: [name], MD  Assessment/Plan: 1. Right hip fx - Ortho on board and managing - s/p right hip hemi-arthroplasty: R  - Most likely cause of leukocytosis with WBC trending down off of antibiotics  2. HTN - Blood pressures relatively well controlled. - At this point will continue to monitor - Will continue bisoprolol, amlodipine  3. Hypothyroidism - Continue Synthroid  4. COPD - stable currently continue current regimen  5. Adenocarcinoma lung s/p radiation therapy: stable issue patient to continue routine follow up as outpatient once discharged.  Code Status: full Family Communication: no family at bedside.  Disposition Plan: Pending further recommendations from orthopaedic surgeon   Consultants:  Ortho  Procedures:  As listed above.  Antibiotics:  None  HPI/Subjective: Pt has no new complaints. Pain is currently tolerable. To work with physical therapy today.  Objective: Filed Vitals:   08/10/13 0532  BP: 132/69  Pulse: 78  Temp: 97.6 F (36.4 C)  Resp: 16    Intake/Output Summary (Last 24 hours) at 08/10/13 1127 Last data filed at 08/10/13 0803  Gross per 24 hour  Intake 3241.67 ml  Output    700 ml  Net 2541.67 ml   Filed Weights   08/07/13 2243  Weight: 41.5 kg (91 lb 7.9 oz)    Exam:   General:  Pt in NAD, Alert and Awake  Cardiovascular: RRR, no MRG  Respiratory: CTA BL, no wheezes  Abdomen: soft, NT, ND  Musculoskeletal: pain at right hip.   Data Reviewed: Basic Metabolic Panel:  Recent Labs Lab 08/07/13 1830 08/08/13 0427 08/10/13 0530  NA 135 137 137  K 3.8 4.3 3.9  CL 101 103 106  CO2 21 26 22   GLUCOSE 113* 115* 106*  BUN 20 12 13   CREATININE 0.53 0.55 0.51  CALCIUM 9.3 8.6 8.1*   Liver Function Tests: No results found for this basename: AST, ALT, ALKPHOS, BILITOT, PROT, ALBUMIN,  in the last 168  hours No results found for this basename: LIPASE, AMYLASE,  in the last 168 hours No results found for this basename: AMMONIA,  in the last 168 hours CBC:  Recent Labs Lab 08/07/13 1830 08/08/13 0427 08/10/13 0530  WBC 25.3* 17.9* 15.2*  NEUTROABS 22.1*  --   --   HGB 15.2* 14.8 13.3  HCT 44.1 44.7 39.2  MCV 89.8 91.0 90.5  PLT 305 279 230   Cardiac Enzymes: No results found for this basename: CKTOTAL, CKMB, CKMBINDEX, TROPONINI,  in the last 168 hours BNP (last 3 results)  Recent Labs  07/11/13 1619  PROBNP 270.7   CBG: No results found for this basename: GLUCAP,  in the last 168 hours  Recent Results (from the past 240 hour(s))  SURGICAL PCR SCREEN     Status: Abnormal   Collection Time    08/08/13 12:41 AM      Result Value Range Status   MRSA, PCR NEGATIVE  NEGATIVE Final   Staphylococcus aureus POSITIVE (*) NEGATIVE Final   Comment:            The Xpert SA Assay (FDA     approved for NASAL specimens     in patients over 33 years of age),     is one component of     a comprehensive surveillance     program.  Test performance has     been validated by [employer] for  patients greater     than or equal to 36 year old.     It is not intended     to diagnose infection nor to     guide or monitor treatment.     Studies: Dg Pelvis Portable  08/09/2013   *RADIOLOGY REPORT*  Clinical Data: Post right hip replacement  PORTABLE PELVIS  Comparison: Portable exam 1828 hours compared to 10/10/2012  Findings: Interval placement of a right hip prosthesis. No fracture or dislocation. Diffuse osseous demineralization. Pelvis appears intact. Old left hip replacement with newly identified lateral plate and multiple cerclage wires.  IMPRESSION: Bilateral hip replacements, new on the right. No acute abnormalities. Osseous demineralization.   Original Report Authenticated By: Ulyses Southward, M.D.    Scheduled Meds: . amLODipine  10 mg Oral Daily  . bisoprolol  7.5 mg Oral  Daily  . budesonide-formoterol  2 puff Inhalation BID  . docusate sodium  100 mg Oral BID  . enoxaparin (LOVENOX) injection  30 mg Subcutaneous Q24H  . feeding supplement  1 Container Oral Q24H  . fentaNYL  50 mcg Transdermal Q72H  . ferrous sulfate  325 mg Oral BID WC  . levothyroxine  25 mcg Oral QAC breakfast   Continuous Infusions: . sodium chloride 50 mL/hr at 08/08/13 1943  . sodium chloride 0.9 % 1,000 mL with potassium chloride 10 mEq infusion 50 mL/hr at 08/09/13 2304    Principal Problem:   Hip fracture, right Active Problems:   Goiter, unspecified   HYPERLIPIDEMIA   DEPRESSION   HYPERTENSION   C O P D WITH ACUTE EXACERBATION   C O P D   PULMONARY NODULE   HTN (hypertension)   Chronic back pain    Time spent: > 35 minutes    Penny Pia  [facility] Pager [phone] If 7PM-7AM, please contact night-coverage at [email or web address], password [facility] 08/10/2013, 11:27 AM  LOS: 3 days

## 2013-08-10 NOTE — Evaluation (Signed)
Physical Therapy Evaluation Patient Details Name: [name] MRN: [id number] DOB: 09-29-1932 Today's Date: 08/10/2013 Time: 4098-1191 PT Time Calculation (min): 27 min  PT Assessment / Plan / Recommendation History of Present Illness     Clinical Impression  Pt s/p R hip fx and hemiarthroplasty presents with decreased R LE strength/ROM, PWB status, post THP, post op pain and premorbid deconditioning limiting functional mobility.    PT Assessment  Patient needs continued PT services    Follow Up Recommendations  SNF    Does the patient have the potential to tolerate intense rehabilitation      Barriers to Discharge        Equipment Recommendations  None recommended by PT    Recommendations for Other Services OT consult   Frequency 7X/week    Precautions / Restrictions Precautions Precautions: Posterior Hip;Fall Precaution Comments: sign hung on wall Restrictions Weight Bearing Restrictions: Yes RLE Weight Bearing: Partial weight bearing RLE Partial Weight Bearing Percentage or Pounds: 50%   Pertinent Vitals/Pain 4/10; premed, ice pack provided      Mobility  Bed Mobility Bed Mobility: Supine to Sit Supine to Sit: 1: +2 Total assist Supine to Sit: Patient Percentage: 30% Details for Bed Mobility Assistance: cues for sequence and use of L LE and UEs to self assist Transfers Transfers: Sit to Stand;Stand to Sit Sit to Stand: 1: +2 Total assist Sit to Stand: Patient Percentage: 50% Stand to Sit: 1: +2 Total assist Stand to Sit: Patient Percentage: 50% Details for Transfer Assistance: cues for LE management and use of UEs to self assist Ambulation/Gait Ambulation/Gait Assistance: 1: +2 Total assist Ambulation/Gait: Patient Percentage: 60% Ambulation Distance (Feet): 3 Feet Assistive device: Rolling walker Ambulation/Gait Assistance Details: cues for sequence, posture, position from RW and PWB Gait Pattern: Step-to pattern;Shuffle;Trunk flexed     Exercises Total Joint Exercises Ankle Circles/Pumps: 10 reps;AROM;Supine;Both Quad Sets: AROM;Both;10 reps;Supine Heel Slides: AAROM;15 reps;Right;Supine Hip ABduction/ADduction: AAROM;Right;10 reps;Supine   PT Diagnosis: Difficulty walking  PT Problem List: Decreased strength;Decreased range of motion;Decreased activity tolerance;Decreased balance;Decreased mobility;Decreased knowledge of use of DME;Pain PT Treatment Interventions: DME instruction;Gait training;Stair training;Functional mobility training;Therapeutic activities;Therapeutic exercise;Patient/family education     PT Goals(Current goals can be found in the care plan section) Acute Rehab PT Goals Patient Stated Goal: Back to Ind living PT Goal Formulation: With patient Time For Goal Achievement: 08/17/13 Potential to Achieve Goals: Good  Visit Information  Last PT Received On: 08/10/13 Assistance Needed: +2       Prior Functioning  Home Living Family/patient expects to be discharged to:: Skilled nursing facility Prior Function Level of Independence: Independent with assistive device(s) Comments: hires help- 3x week and cleaning lady every other week Communication Communication: No difficulties Dominant Hand: Right    Cognition  Cognition Arousal/Alertness: Awake/alert Behavior During Therapy: WFL for tasks assessed/performed Overall Cognitive Status: Within Functional Limits for tasks assessed    Extremity/Trunk Assessment Upper Extremity Assessment Upper Extremity Assessment: Overall WFL for tasks assessed Lower Extremity Assessment Lower Extremity Assessment: RLE deficits/detail RLE Deficits / Details: 2/5 hip strength with AAROM at hip to 75 flex and 10 abd Cervical / Trunk Assessment Cervical / Trunk Assessment: Kyphotic   Balance    End of Session PT - End of Session Equipment Utilized During Treatment: Gait belt Activity Tolerance: Patient tolerated treatment well Patient left: in chair;with call  bell/phone within reach Nurse Communication: Mobility status  GP     Aseret Hoffman 08/10/2013, 1:01 PM

## 2013-08-10 NOTE — Progress Notes (Signed)
   Subjective: 1 Day Post-Op Procedure(s) (LRB): RIGHT HIP HEMI-ARTHROPLASTY (Right)   Patient reports pain as mild, pain well controlled. A little nervous, but looking forward to working with PT. No events throughout the night. Plan is for eventual return to the [facility].  Objective:   VITALS:   Filed Vitals:   08/10/13 0532  BP: 132/69  Pulse: 78  Temp: 97.6 F (36.4 C)  Resp: 16    Neurovascular intact Dorsiflexion/Plantar flexion intact Incision: dressing C/D/I No cellulitis present Compartment soft  LABS  Recent Labs  08/07/13 1830 08/08/13 0427 08/10/13 0530  HGB 15.2* 14.8 13.3  HCT 44.1 44.7 39.2  WBC 25.3* 17.9* 15.2*  PLT 305 279 230     Recent Labs  08/07/13 1830 08/08/13 0427 08/10/13 0530  NA 135 137 137  K 3.8 4.3 3.9  BUN 20 12 13   CREATININE 0.53 0.55 0.51  GLUCOSE 113* 115* 106*     Assessment/Plan: 1 Day Post-Op Procedure(s) (LRB): RIGHT HIP HEMI-ARTHROPLASTY (Right) Foley cath d/c'ed Advance diet Up with therapy, 50% WB on the right leg D/C IV fluids Orthopaedically stable  Lovenox for 2 weeks, Rx written Hydrocodone as needed for pain, Rx written Discharge to SNF eventually when ready Follow up in 2 weeks at [facility]. Follow up with OLIN,Alzena Gerber D in 2 weeks.  Contact information:  [facility] [address] [place] [address] [phone]         Anastasio Auerbach. Dorwin Fitzhenry   PAC  08/10/2013, 8:43 AM

## 2013-08-11 NOTE — Progress Notes (Signed)
[facility] PROGRESS NOTE  [name] YNW:[id number] DOB: 1932-08-03 DOA: 08/07/2013 PCP: [name], MD  Assessment/Plan: 1. Right hip fx - Ortho on board and managing - s/p right hip hemi-arthroplasty: R  - Most likely cause of leukocytosis with WBC trending down off of antibiotics - Physical therapy and OT have recommended SNF.  2. HTN - Blood pressures relatively well controlled. - At this point will continue to monitor - Will continue bisoprolol, amlodipine  3. Hypothyroidism - Continue Synthroid  4. COPD - stable currently continue current regimen  5. Adenocarcinoma lung s/p radiation therapy: stable issue patient to continue routine follow up as outpatient once discharged.  Code Status: full Family Communication: no family at bedside.  Disposition Plan: Pending further recommendations from orthopaedic surgeon   Consultants:  Ortho  Procedures:  As listed above.  Antibiotics:  None  HPI/Subjective: Pt has no new complaints. Pain is currently tolerable. To work with physical therapy today.  Objective: Filed Vitals:   08/11/13 0800  BP:   Pulse:   Temp:   Resp: 16    Intake/Output Summary (Last 24 hours) at 08/11/13 1055 Last data filed at 08/11/13 0824  Gross per 24 hour  Intake 2044.16 ml  Output   1150 ml  Net 894.16 ml   Filed Weights   08/07/13 2243  Weight: 41.5 kg (91 lb 7.9 oz)    Exam:   General:  Pt in NAD, Alert and Awake  Cardiovascular: RRR, no MRG  Respiratory: CTA BL, no wheezes  Abdomen: soft, NT, ND  Musculoskeletal: no cyanosis or clubbing  Data Reviewed: Basic Metabolic Panel:  Recent Labs Lab 08/07/13 1830 08/08/13 0427 08/10/13 0530  NA 135 137 137  K 3.8 4.3 3.9  CL 101 103 106  CO2 21 26 22   GLUCOSE 113* 115* 106*  BUN 20 12 13   CREATININE 0.53 0.55 0.51  CALCIUM 9.3 8.6 8.1*   Liver Function Tests: No results found for this basename: AST, ALT, ALKPHOS, BILITOT, PROT,  ALBUMIN,  in the last 168 hours No results found for this basename: LIPASE, AMYLASE,  in the last 168 hours No results found for this basename: AMMONIA,  in the last 168 hours CBC:  Recent Labs Lab 08/07/13 1830 08/08/13 0427 08/10/13 0530  WBC 25.3* 17.9* 15.2*  NEUTROABS 22.1*  --   --   HGB 15.2* 14.8 13.3  HCT 44.1 44.7 39.2  MCV 89.8 91.0 90.5  PLT 305 279 230   Cardiac Enzymes: No results found for this basename: CKTOTAL, CKMB, CKMBINDEX, TROPONINI,  in the last 168 hours BNP (last 3 results)  Recent Labs  07/11/13 1619  PROBNP 270.7   CBG: No results found for this basename: GLUCAP,  in the last 168 hours  Recent Results (from the past 240 hour(s))  SURGICAL PCR SCREEN     Status: Abnormal   Collection Time    08/08/13 12:41 AM      Result Value Range Status   MRSA, PCR NEGATIVE  NEGATIVE Final   Staphylococcus aureus POSITIVE (*) NEGATIVE Final   Comment:            The Xpert SA Assay (FDA     approved for NASAL specimens     in patients over 23 years of age),     is one component of     a comprehensive surveillance     program.  Test performance has     been validated by [employer]  [facility] for patients greater     than or equal to 83 year old.     It is not intended     to diagnose infection nor to     guide or monitor treatment.     Studies: Dg Pelvis Portable  08/09/2013   *RADIOLOGY REPORT*  Clinical Data: Post right hip replacement  PORTABLE PELVIS  Comparison: Portable exam 1828 hours compared to 10/10/2012  Findings: Interval placement of a right hip prosthesis. No fracture or dislocation. Diffuse osseous demineralization. Pelvis appears intact. Old left hip replacement with newly identified lateral plate and multiple cerclage wires.  IMPRESSION: Bilateral hip replacements, new on the right. No acute abnormalities. Osseous demineralization.   Original Report Authenticated By: Ulyses Southward, M.D.    Scheduled Meds: . amLODipine  10 mg Oral Daily  .  bisoprolol  7.5 mg Oral Daily  . budesonide-formoterol  2 puff Inhalation BID  . docusate sodium  100 mg Oral BID  . enoxaparin (LOVENOX) injection  30 mg Subcutaneous Q24H  . feeding supplement  1 Container Oral Q24H  . fentaNYL  50 mcg Transdermal Q72H  . ferrous sulfate  325 mg Oral BID WC  . levothyroxine  25 mcg Oral QAC breakfast   Continuous Infusions: . sodium chloride 50 mL/hr at 08/10/13 2048  . sodium chloride 0.9 % 1,000 mL with potassium chloride 10 mEq infusion 50 mL/hr at 08/11/13 4098    Principal Problem:   Hip fracture, right Active Problems:   Goiter, unspecified   HYPERLIPIDEMIA   DEPRESSION   HYPERTENSION   C O P D WITH ACUTE EXACERBATION   C O P D   PULMONARY NODULE   HTN (hypertension)   Chronic back pain    Time spent: > 35 minutes    Penny Pia  [facility] Pager [phone] If 7PM-7AM, please contact night-coverage at [email or web address], password [facility] 08/11/2013, 10:55 AM  LOS: 4 days

## 2013-08-11 NOTE — Evaluation (Signed)
Occupational Therapy Evaluation Patient Details Name: [name] MRN: [id number] DOB: Dec 31, 1931 Today's Date: 08/11/2013 Time: 4098-1191 OT Time Calculation (min): 28 min  OT Assessment / Plan / Recommendation History of present illness s/p R hemiarthroplasty due to hip fx   Clinical Impression   Pt presents as a frail 79 year old woman who requires 1-2+ assist for mobility and is dependent in toileting and LB ADL.  She will need post acute rehab in a SNF setting.    OT Assessment  All further OT needs can be met in the next venue of care    Follow Up Recommendations  SNF    Barriers to Discharge      Equipment Recommendations       Recommendations for Other Services    Frequency       Precautions / Restrictions Precautions Precautions: Posterior Hip;Fall Precaution Comments: reviewed posterior precautions Restrictions Weight Bearing Restrictions: Yes RLE Weight Bearing: Partial weight bearing RLE Partial Weight Bearing Percentage or Pounds: 50   Pertinent Vitals/Pain VSS, no pain reported, premedicated    ADL  Eating/Feeding: Independent Where Assessed - Eating/Feeding: Bed level Grooming: Wash/dry hands;Wash/dry face;Set up Where Assessed - Grooming: Unsupported sitting Upper Body Bathing: Set up Where Assessed - Upper Body Bathing: Unsupported sitting Lower Body Bathing: +1 Total assistance Where Assessed - Lower Body Bathing: Unsupported sitting;Supported sit to stand Upper Body Dressing: Set up Where Assessed - Upper Body Dressing: Unsupported sitting Lower Body Dressing: +1 Total assistance Where Assessed - Lower Body Dressing: Unsupported sitting;Supported sit to stand Toilet Transfer: Minimal assistance Toilet Transfer Method: Stand pivot Toilet Transfer Equipment: Bedside commode Toileting - Clothing Manipulation and Hygiene: +1 Total assistance Where Assessed - Glass blower/designer Manipulation and Hygiene: Standing Equipment Used: Rolling  walker;Gait belt Transfers/Ambulation Related to ADLs: min assist and verbal cues for WB ADL Comments: Instructed pt in hip precautions related to LB bathing and dressing.    OT Diagnosis: Generalized weakness;Acute pain  OT Problem List: Decreased strength;Decreased activity tolerance;Impaired balance (sitting and/or standing);Decreased knowledge of use of DME or AE;Pain OT Treatment Interventions:     OT Goals(Current goals can be found in the care plan section) Acute Rehab OT Goals Patient Stated Goal: Back to Ind living  Visit Information  Last OT Received On: 08/11/13 Assistance Needed: +2 (for amb) History of Present Illness: s/p R hemiarthroplasty due to hip fx       Prior Functioning     Home Living Family/patient expects to be discharged to:: Skilled nursing facility Additional Comments: was at home getting HHPT:  fell.  Had been at [facility] before this.  Plans to go to the [facility] for rehab Prior Function Level of Independence: Independent with assistive device(s) Comments: hires help- 3x week and cleaning lady every other week Communication Communication: No difficulties Dominant Hand: Right         Vision/Perception Vision - History Baseline Vision: Wears glasses all the time Patient Visual Report: No change from baseline   Cognition  Cognition Arousal/Alertness: Awake/alert Behavior During Therapy: WFL for tasks assessed/performed Overall Cognitive Status: Within Functional Limits for tasks assessed    Extremity/Trunk Assessment Upper Extremity Assessment Upper Extremity Assessment: Overall WFL for tasks assessed Lower Extremity Assessment Lower Extremity Assessment: Defer to PT evaluation Cervical / Trunk Assessment Cervical / Trunk Assessment: Kyphotic     Mobility Bed Mobility Bed Mobility: Supine to Sit;Sit to Supine;Sitting - Scoot to Edge of Bed Supine to Sit: 4: Min assist Sitting - Scoot to [place] of  Bed: 5: Supervision Sit to Supine: 4: Min  assist Details for Bed Mobility Assistance: assist for R LE Transfers Transfers: Sit to Stand;Stand to Sit Sit to Stand: 3: Mod assist;With upper extremity assist;From bed;From chair/3-in-1 Stand to Sit: 3: Mod assist;With upper extremity assist;To bed;To chair/3-in-1 Details for Transfer Assistance: cues for LE management and use of UEs to self assist     Exercise     Balance Balance Balance Assessed: Yes Static Standing Balance Static Standing - Balance Support: Bilateral upper extremity supported;During functional activity Static Standing - Level of Assistance: 5: Stand by assistance   End of Session OT - End of Session Activity Tolerance: Patient tolerated treatment well Patient left: in bed;with call bell/phone within reach Nurse Communication:  (pt had bm)  GO     Evern Bio 08/11/2013, 8:44 AM  [phone]

## 2013-08-11 NOTE — Progress Notes (Signed)
Physical Therapy Treatment Patient Details Name: [name] MRN: [id number] DOB: 06-06-1932 Today's Date: 08/11/2013 Time: 9629-5284 PT Time Calculation (min): 24 min  PT Assessment / Plan / Recommendation  History of Present Illness s/p R hemiarthroplasty due to hip fx   PT Comments   POD # 2 am session.  Assisted pt OOB to amb limited distance with difficulty proper gait sequencing now that she Fx R LE vs L LE in the past.  Positioned in recliner and performed TE's.  Pt plans to D/C to [facility] for [employer].   Follow Up Recommendations  SNF     Does the patient have the potential to tolerate intense rehabilitation     Barriers to Discharge        Equipment Recommendations       Recommendations for Other Services    Frequency 7X/week   Progress towards PT Goals Progress towards PT goals: Progressing toward goals  Plan      Precautions / Restrictions Precautions Precautions: Posterior Hip;Fall Precaution Comments: pt verbally recalls 1/3 THP so re educated and demonstarted Restrictions Weight Bearing Restrictions: Yes RLE Weight Bearing: Partial weight bearing RLE Partial Weight Bearing Percentage or Pounds: 50 Other Position/Activity Restrictions: Pt did recall she was 50% WB   Pertinent Vitals/Pain C/o 5/10 during act ICE applied    Mobility  Bed Mobility Bed Mobility: Supine to Sit;Sit to Supine;Sitting - Scoot to Edge of Bed Supine to Sit: 3: Mod assist Sitting - Scoot to Edge of Bed: 3: Mod assist Sit to Supine: 4: Min assist Details for Bed Mobility Assistance: increased difficulty getting OOB this am Transfers Transfers: Sit to Stand;Stand to Sit Sit to Stand: 3: Mod assist;With upper extremity assist;From bed Stand to Sit: 3: Mod assist;With upper extremity assist;To chair/3-in-1 Details for Transfer Assistance: cues for LE management and use of UEs to self assist Ambulation/Gait Ambulation/Gait Assistance: 1: +2 Total  assist Ambulation/Gait: Patient Percentage: 60% Ambulation Distance (Feet): 11 Feet Ambulation/Gait Assistance Details: 100% VC's on proper sequencing to advance R before L and proper walker to self placement to ebsure PWB thru R LE.  Gait Pattern: Step-to pattern;Shuffle;Trunk flexed    Exercises   Hip TE's 10 reps ankle pumps 10 reps knee presses 10 reps heel slides 10 reps SAQ's 10 reps ABD Followed by ICE    PT Goals (current goals can now be found in the care plan section) Acute Rehab PT Goals Patient Stated Goal: Back to Ind living  Visit Information  Last PT Received On: 08/11/13 Assistance Needed: +2 History of Present Illness: s/p R hemiarthroplasty due to hip fx    Subjective Data  Patient Stated Goal: Back to Ind living   Cognition  Cognition Arousal/Alertness: Awake/alert Behavior During Therapy: WFL for tasks assessed/performed Overall Cognitive Status: Within Functional Limits for tasks assessed    Balance  Balance Balance Assessed: Yes Static Standing Balance Static Standing - Balance Support: Bilateral upper extremity supported;During functional activity Static Standing - Level of Assistance: 5: Stand by assistance  End of Session PT - End of Session Equipment Utilized During Treatment: Gait belt Activity Tolerance: Patient limited by fatigue Patient left: in chair;with call bell/phone within reach Nurse Communication: Mobility status   Felecia Shelling  PTA [facility]  Acute  Rehab Pager      [phone]

## 2013-08-11 NOTE — Progress Notes (Signed)
PHYSICAL THERAPY No pm Tx session, pt back in bed and comfort bale. Felecia Shelling  PTA [facility]  Acute  Rehab Pager      [phone]

## 2013-08-11 NOTE — Plan of Care (Signed)
Problem: Phase III Progression Outcomes Goal: Anticoagulant follow-up in place Outcome: Not Applicable Date Met:  08/11/13 lovenox

## 2013-08-11 NOTE — Progress Notes (Signed)
Subjective: 2 Days Post-Op Procedure(s) (LRB): RIGHT HIP HEMI-ARTHROPLASTY (Right) Patient reports pain as mild.  No c/o.  Objective: Vital signs in last 24 hours: Temp:  [97.1 F (36.2 C)-98.3 F (36.8 C)] 98.1 F (36.7 C) (08/24 1315) Pulse Rate:  [75-83] 75 (08/24 1315) Resp:  [14-20] 14 (08/24 1315) BP: (141-151)/(75-81) 151/75 mmHg (08/24 1315) SpO2:  [95 %-100 %] 100 % (08/24 1315)  Intake/Output from previous day: 08/23 0701 - 08/24 0700 In: 2044.2 [P.O.:720; I.V.:1324.2] Out: 1200 [Urine:1200] Intake/Output this shift: Total I/O In: 360 [P.O.:360] Out: 300 [Urine:300]   Recent Labs  08/10/13 0530  HGB 13.3    Recent Labs  08/10/13 0530  WBC 15.2*  RBC 4.33  HCT 39.2  PLT 230    Recent Labs  08/10/13 0530  NA 137  K 3.9  CL 106  CO2 22  BUN 13  CREATININE 0.51  GLUCOSE 106*  CALCIUM 8.1*   No results found for this basename: LABPT, INR,  in the last 72 hours  PE:  right hip wound dressed and dry.  NVI at R LE.  Assessment/Plan: 2 Days Post-Op Procedure(s) (LRB): RIGHT HIP HEMI-ARTHROPLASTY (Right) Up with therapy  D/c planning.  [name] 08/11/2013, 1:58 PM

## 2013-08-12 DIAGNOSIS — J984 Other disorders of lung: Secondary | ICD-10-CM

## 2013-08-12 DIAGNOSIS — I10 Essential (primary) hypertension: Secondary | ICD-10-CM

## 2013-08-12 DIAGNOSIS — E785 Hyperlipidemia, unspecified: Secondary | ICD-10-CM

## 2013-08-12 MED ORDER — FERROUS SULFATE 325 (65 FE) MG PO TABS: 325.0000 mg | ORAL_TABLET | Freq: Two times a day (BID) | ORAL | Status: DC

## 2013-08-12 MED ORDER — TRAMADOL HCL 50 MG PO TABS: 50.0000 mg | ORAL_TABLET | Freq: Four times a day (QID) | ORAL | Status: AC | PRN

## 2013-08-12 MED ORDER — BISACODYL 10 MG RE SUPP
10.0000 mg | Freq: Once | RECTAL | Status: AC
  Administered 2013-08-12: 10 mg via RECTAL
  Filled 2013-08-12: qty 1

## 2013-08-12 MED ORDER — FENTANYL 50 MCG/HR TD PT72: 1.0000 | MEDICATED_PATCH | TRANSDERMAL | Status: AC

## 2013-08-12 NOTE — Discharge Summary (Signed)
Physician Discharge Summary  [name] XBJ:[id number] DOB: Jan 19, 1932 DOA: 08/07/2013  PCP: [name], MD  Admit date: 08/07/2013 Discharge date: 08/12/2013  Time spent: > 35 minutes  Recommendations for Outpatient Follow-up:  1. Patient will require f/u with ortho with Dr. Charlann Boxer in 2 weeks.  Discharge Diagnoses:  Principal Problem:   Hip fracture, right Active Problems:   Goiter, unspecified   HYPERLIPIDEMIA   DEPRESSION   HYPERTENSION   C O P D WITH ACUTE EXACERBATION   C O P D   PULMONARY NODULE   HTN (hypertension)   Chronic back pain   Discharge Condition: stable  Diet recommendation: Low sodium, heart healthy  Filed Weights   08/07/13 2243  Weight: 41.5 kg (91 lb 7.9 oz)    History of present illness:  79 y/o with h/o htn, adenocarcinoma of left lung s/p radiotherapy, depression, and osteoporosis.  Presented to the ED after mechanical fall and was diagnosed with X-rays taken with a mid-cervical fracture of the right femoral neck  Hospital Course:   1. Right hip fx - Ortho on board and managing  - s/p right hip hemi-arthroplasty: R  - Ortho recommended the following: Up with therapy, 50% WB on the right leg  Discharge to SNF when ready medically.  Orthopaedically stable  Lovenox for 2 weeks, Rx written  Hydrocodone as needed for pain, Rx written  Follow up in 2 weeks at [facility]  2. HTN  - Blood pressures relatively well controlled.  - Will continue bisoprolol, amlodipine   3. Hypothyroidism  - Continue Synthroid on d/c  4. COPD  - stable currently continue home regimen  5. Adenocarcinoma lung s/p radiation therapy: stable issue patient to continue routine follow up as outpatient once discharged.    Procedures: RIGHT HIP HEMI-ARTHROPLASTY (Right)   Consultations:  Ortho: Dr. Victorino Dike  Discharge Exam: Filed Vitals:   08/12/13 1033  BP: 135/66  Pulse:   Temp:   Resp:     General: Pt in Nad, Alert and  awake Cardiovascular: RRR, no MRG Respiratory: CTA BL, no wheezes  Discharge Instructions  Discharge Orders   Future Orders Complete By Expires   Call MD / Call 911  As directed    Comments:     If you experience chest pain or shortness of breath, CALL 911 and be transported to the hospital emergency room.  If you develope a fever above 101 F, pus (white drainage) or increased drainage or redness at the wound, or calf pain, call your surgeon's office.   Call MD for:  severe uncontrolled pain  As directed    Call MD for:  temperature >100.4  As directed    Change dressing  As directed    Comments:     Maintain surgical dressing for 10-14 days, then replace with 4x4 guaze and tape. Keep the area dry and clean.   Constipation Prevention  As directed    Comments:     Drink plenty of fluids.  Prune juice may be helpful.  You may use a stool softener, such as Colace (over the counter) 100 mg twice a day.  Use MiraLax (over the counter) for constipation as needed.   Diet - low sodium heart healthy  As directed    Diet - low sodium heart healthy  As directed    Discharge instructions  As directed    Comments:     Maintain surgical dressing for 10-14 days, then replace with gauze and tape. Keep the  area dry and clean until follow up. Follow up in 2 weeks at [facility]. Call with any questions or concerns.   Increase activity slowly  As directed    Partial weight bearing  As directed    Comments:     50% WB right leg   TED hose  As directed    Comments:     Use stockings (TED hose) for 2 weeks on both leg(s).  You may remove them at night for sleeping.       Medication List         albuterol 108 (90 BASE) MCG/ACT inhaler  Commonly known as:  PROVENTIL HFA;VENTOLIN HFA  Inhale 2 puffs into the lungs every 6 (six) hours as needed. For wheezing     amLODipine 10 MG tablet  Commonly known as:  NORVASC  Take 10 mg by mouth daily.     bisoprolol 5 MG tablet  Commonly known  as:  ZEBETA  Take 7.5 mg by mouth daily. Pt takes 1.5 tablet     budesonide-formoterol 160-4.5 MCG/ACT inhaler  Commonly known as:  SYMBICORT  Inhale 2 puffs into the lungs 2 (two) times daily.     enoxaparin 30 MG/0.3ML injection  Commonly known as:  LOVENOX  Inject 0.3 mLs (30 mg total) into the skin daily.     fentaNYL 50 MCG/HR  Commonly known as:  DURAGESIC - dosed mcg/hr  Place 1 patch (50 mcg total) onto the skin every 3 (three) days.     ferrous sulfate 325 (65 FE) MG tablet  Take 1 tablet (325 mg total) by mouth 2 (two) times daily with a meal.     HYDROcodone-acetaminophen 5-325 MG per tablet  Commonly known as:  NORCO/VICODIN  Take 1-2 tablets by mouth every 4 (four) hours as needed.     levothyroxine 25 MCG tablet  Commonly known as:  SYNTHROID, LEVOTHROID  Take 25 mcg by mouth daily.     traMADol 50 MG tablet  Commonly known as:  ULTRAM  Take 1 tablet (50 mg total) by mouth every 6 (six) hours as needed for pain.       Allergies  Allergen Reactions  . Aspirin     REACTION: gi upset  . Morphine And Related Other (See Comments)    Hallucinations  . Penicillins Nausea And Vomiting       Follow-up Information   Follow up with Shelda Pal, MD. Schedule an appointment as soon as possible for a visit in 2 weeks.   Specialty:  Orthopedic Surgery   Contact information:   [address] [place] [address] [phone]        The results of significant diagnostics from this hospitalization (including imaging, microbiology, ancillary and laboratory) are listed below for reference.    Significant Diagnostic Studies: Dg Chest 2 View  08/07/2013   CLINICAL DATA:  Lung cancer. Preoperative for hip fracture. Hypertension.  EXAM: CHEST  2 VIEW  COMPARISON:  07/11/2013  FINDINGS: Emphysema and fibrosis. Fiducials in the left upper lobe and left lower lobe with adjacent ill-defined densities similar to prior. Densely calcified mitral valve.   Prior thoracolumbar compression fractures with vertebral augmentation.  IMPRESSION: 1. Emphysema and fibrosis. 2. Stable vague densities in the left upper lobe and left lower lobe, with adjacent fiducial markers. 3. Densely calcified mitral valve. 4. Thoracolumbar compression fractures with prior vertebral augmentations.   Electronically Signed   By: Herbie Baltimore   On: 08/07/2013 19:40   Dg  Hip Complete Right  08/07/2013   *RADIOLOGY REPORT*  Clinical Data: Fall, right hip pain  RIGHT HIP - COMPLETE 2+ VIEW  Comparison: None.  Findings: There is a mid-cervical fracture of the right femoral neck.  The femoral head over veins the femoral neck, but there is no fracture line.  This suggests an old, healed subcapital fracture.  The mid-cervical fracture line is well defined.  The shaft fracture fragment has mildly displaced superiorly by 5 mm. There is mild varus angulation.  There is no fracture comminution. There is apex anterior angulation and more significant anterior displacement measuring just over 1 cm.  There are no other fractures.  The bones are diffusely demineralized.  The left hip prosthesis appears well seated and aligned.  IMPRESSION: Mid-cervical fracture of the right femoral neck, mildly displaced with mild varus angulation as well as apex anterior angulation.  No comminution.   Original Report Authenticated By: Amie Portland, M.D.   Dg Femur Right  08/07/2013   *RADIOLOGY REPORT*  Clinical Data: Fall.  Right hip pain.  RIGHT FEMUR - 2 VIEW  Comparison: None.  Findings: The right mid-cervical femoral neck fractures again noted, described on the right hip radiographs.  There are no additional fractures.  The hip knee joints are normally aligned.  The bones are diffusely demineralized.  IMPRESSION: Mid-cervical fracture of the right femoral neck.  No other fractures.  No dislocations   Original Report Authenticated By: Amie Portland, M.D.   Dg Pelvis Portable  08/09/2013   *RADIOLOGY REPORT*   Clinical Data: Post right hip replacement  PORTABLE PELVIS  Comparison: Portable exam 1828 hours compared to 10/10/2012  Findings: Interval placement of a right hip prosthesis. No fracture or dislocation. Diffuse osseous demineralization. Pelvis appears intact. Old left hip replacement with newly identified lateral plate and multiple cerclage wires.  IMPRESSION: Bilateral hip replacements, new on the right. No acute abnormalities. Osseous demineralization.   Original Report Authenticated By: Ulyses Southward, M.D.    Microbiology: Recent Results (from the past 240 hour(s))  SURGICAL PCR SCREEN     Status: Abnormal   Collection Time    08/08/13 12:41 AM      Result Value Range Status   MRSA, PCR NEGATIVE  NEGATIVE Final   Staphylococcus aureus POSITIVE (*) NEGATIVE Final   Comment:            The Xpert SA Assay (FDA     approved for NASAL specimens     in patients over 32 years of age),     is one component of     a comprehensive surveillance     program.  Test performance has     been validated by [employer] for patients greater     than or equal to 20 year old.     It is not intended     to diagnose infection nor to     guide or monitor treatment.     Labs: Basic Metabolic Panel:  Recent Labs Lab 08/07/13 1830 08/08/13 0427 08/10/13 0530  NA 135 137 137  K 3.8 4.3 3.9  CL 101 103 106  CO2 21 26 22   GLUCOSE 113* 115* 106*  BUN 20 12 13   CREATININE 0.53 0.55 0.51  CALCIUM 9.3 8.6 8.1*   Liver Function Tests: No results found for this basename: AST, ALT, ALKPHOS, BILITOT, PROT, ALBUMIN,  in the last 168 hours No results found for this basename: LIPASE, AMYLASE,  in the last  168 hours No results found for this basename: AMMONIA,  in the last 168 hours CBC:  Recent Labs Lab 08/07/13 1830 08/08/13 0427 08/10/13 0530  WBC 25.3* 17.9* 15.2*  NEUTROABS 22.1*  --   --   HGB 15.2* 14.8 13.3  HCT 44.1 44.7 39.2  MCV 89.8 91.0 90.5  PLT 305 279 230   Cardiac  Enzymes: No results found for this basename: CKTOTAL, CKMB, CKMBINDEX, TROPONINI,  in the last 168 hours BNP: BNP (last 3 results)  Recent Labs  07/11/13 1619  PROBNP 270.7   CBG: No results found for this basename: GLUCAP,  in the last 168 hours     Signed:  Penny Pia  [facility] 08/12/2013, 11:44 AM

## 2013-08-12 NOTE — Progress Notes (Signed)
   Subjective: 3 Days Post-Op Procedure(s) (LRB): RIGHT HIP HEMI-ARTHROPLASTY (Right)   Patient reports pain as mild, pain well controlled. No events throughout the night. States that she has been working hard with PT.   Objective:   VITALS:   Filed Vitals:   08/12/13 0435  BP: 131/74  Pulse: 69  Temp: 97.9 F (36.6 C)  Resp: 16    Neurovascular intact Dorsiflexion/Plantar flexion intact Incision: dressing C/D/I No cellulitis present Compartment soft  LABS  Recent Labs  08/10/13 0530  HGB 13.3  HCT 39.2  WBC 15.2*  PLT 230     Recent Labs  08/10/13 0530  NA 137  K 3.9  BUN 13  CREATININE 0.51  GLUCOSE 106*     Assessment/Plan: 3 Days Post-Op Procedure(s) (LRB): RIGHT HIP HEMI-ARTHROPLASTY (Right) Up with therapy, 50% WB on the right leg Discharge to SNF when ready medically. Orthopaedically stable  Lovenox for 2 weeks, Rx written  Hydrocodone as needed for pain, Rx written Follow up in 2 weeks at [facility]. Follow up with OLIN,Manuel Dall D in 2 weeks.  Contact information:  [facility] [address], [address] [place] [address] [phone]        Anastasio Auerbach. Nikira Kushnir   PAC  08/12/2013, 7:28 AM

## 2013-08-12 NOTE — Progress Notes (Addendum)
Patient is set to discharge to [facility] today. Patient & son, Freida Busman (ph#: [phone]) aware. Discharge packet at nursing station. PTAR scheduled for 2:00 pickup (Service Request Id: [id number]). RN, Lancie aware.  Unice Bailey, LCSW [facility] Clinical Social Worker cell #: [phone]

## 2013-08-13 ENCOUNTER — Encounter (HOSPITAL_COMMUNITY): Payer: Self-pay | Admitting: Orthopedic Surgery

## 2013-09-15 IMAGING — CR DG PORTABLE PELVIS
1 series · 2 of 2 positions shown · non-contrast
Comparison: Portable exam 1909 hours compared to 10/10/2012

CLINICAL DATA: Post right hip replacement

PORTABLE PELVIS

[Series 1: AP · U · 2 of 2 slices shown]
[im 1/2]
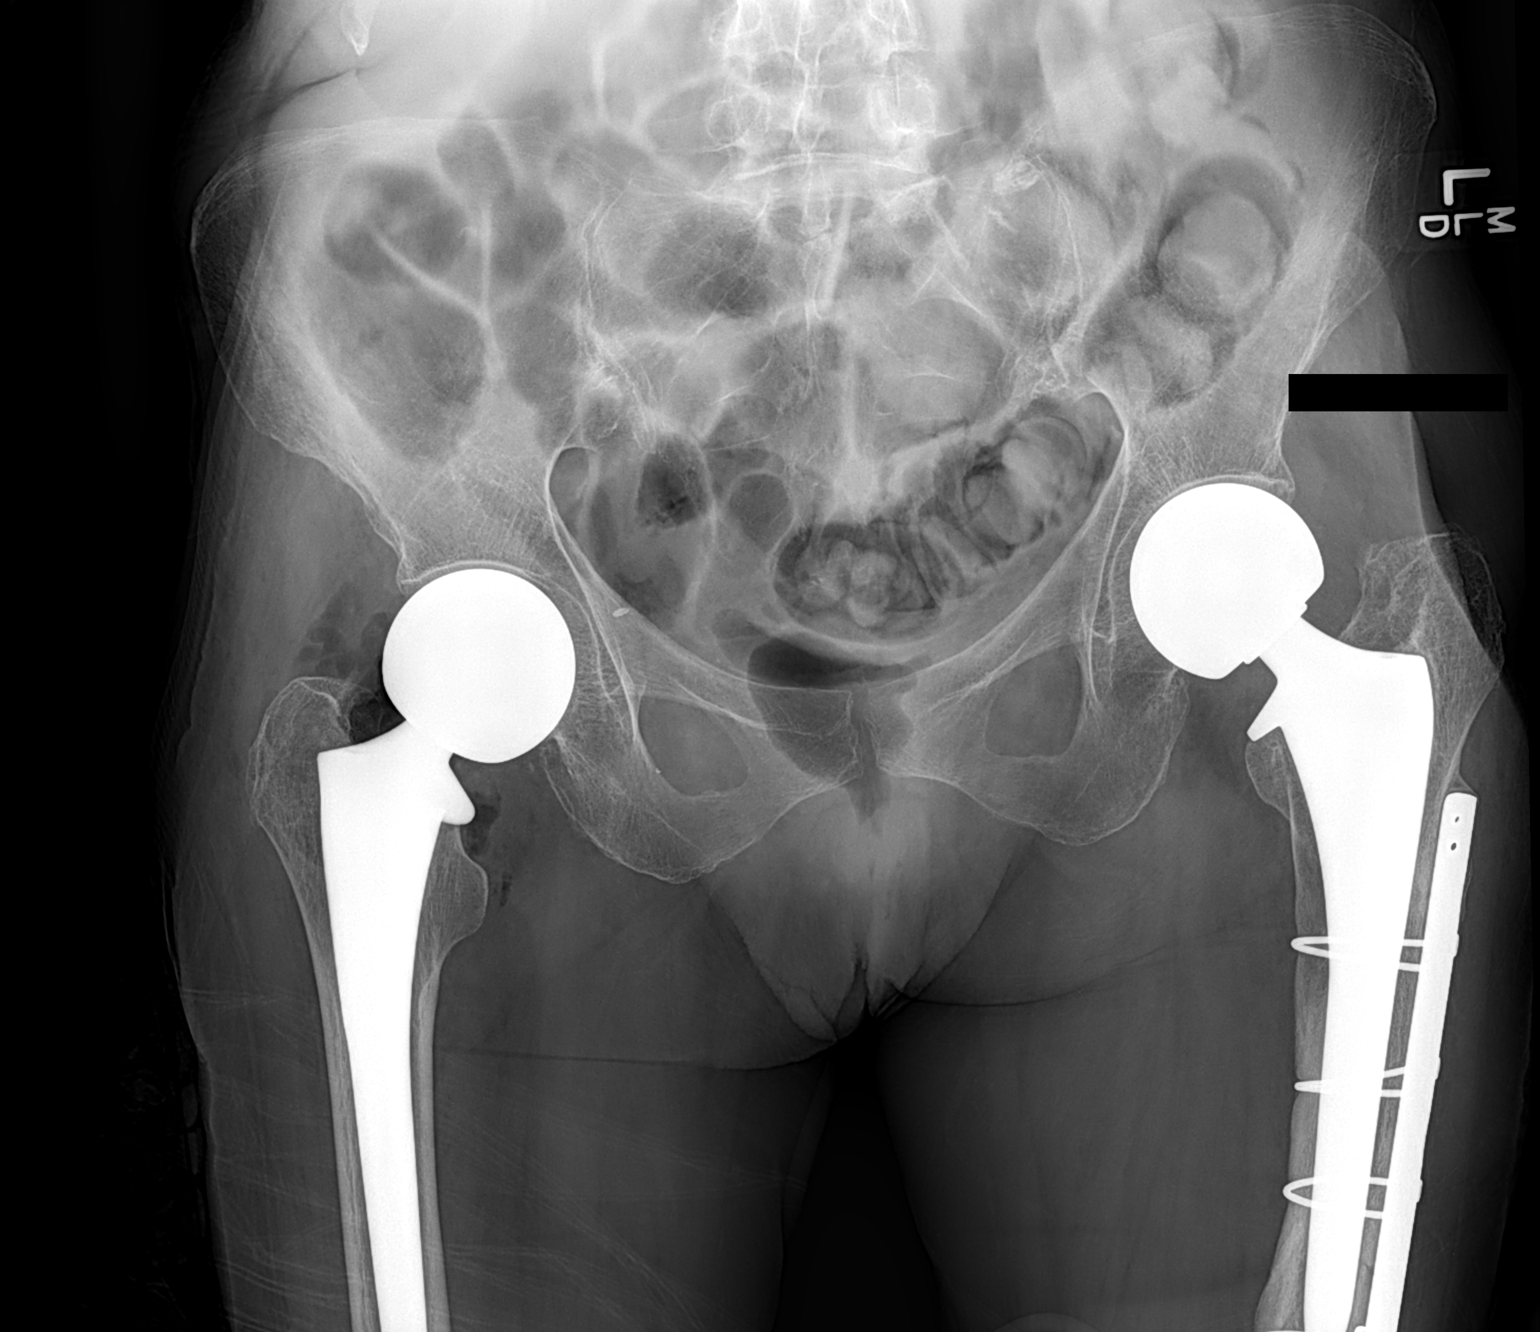
[im 2/2]
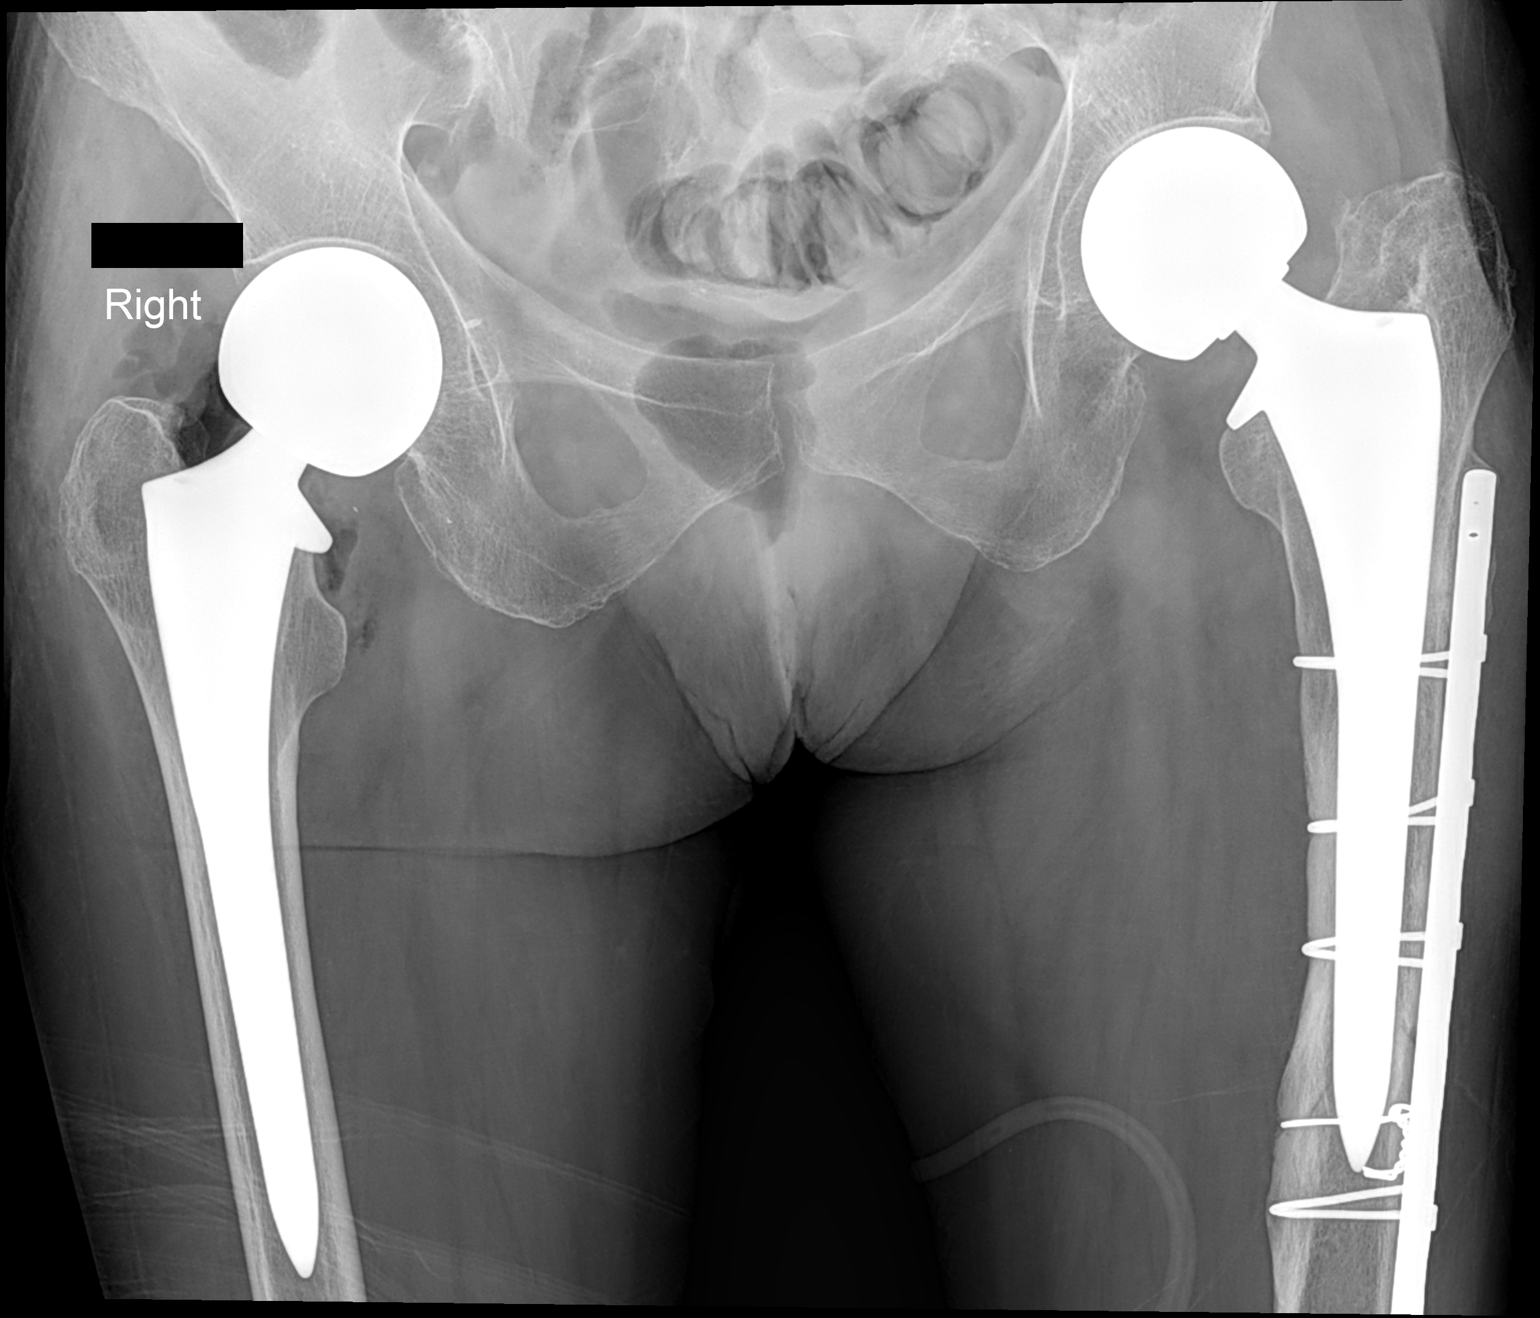

[2 of 2 positions shown; findings below may reference images not displayed]

FINDINGS: Interval placement of a right hip prosthesis.
No fracture or dislocation.
Diffuse osseous demineralization.
Pelvis appears intact.
Old left hip replacement with newly identified lateral plate and
multiple cerclage wires.
IMPRESSION: Bilateral hip replacements, new on the right.
No acute abnormalities.
Osseous demineralization.

## 2013-11-15 ENCOUNTER — Other Ambulatory Visit: Payer: Self-pay | Admitting: Adult Health

## 2013-11-18 NOTE — Telephone Encounter (Signed)
Left message on voicemail requesting patient return call to advise on PCP

## 2014-03-09 ENCOUNTER — Encounter (HOSPITAL_COMMUNITY): Payer: Self-pay | Admitting: Emergency Medicine

## 2014-03-09 ENCOUNTER — Emergency Department (HOSPITAL_COMMUNITY): Payer: Medicare Other

## 2014-03-09 ENCOUNTER — Emergency Department (HOSPITAL_COMMUNITY)
Admission: EM | Admit: 2014-03-09 | Discharge: 2014-03-09 | Disposition: A | Discharge status: Home / Self Care | Payer: Medicare Other | Attending: Emergency Medicine | Admitting: Emergency Medicine

## 2014-03-09 DIAGNOSIS — J441 Chronic obstructive pulmonary disease with (acute) exacerbation: Secondary | ICD-10-CM | POA: Insufficient documentation

## 2014-03-09 DIAGNOSIS — Z880 Allergy status to penicillin: Secondary | ICD-10-CM | POA: Insufficient documentation

## 2014-03-09 DIAGNOSIS — Z8719 Personal history of other diseases of the digestive system: Secondary | ICD-10-CM | POA: Insufficient documentation

## 2014-03-09 DIAGNOSIS — G8929 Other chronic pain: Secondary | ICD-10-CM | POA: Insufficient documentation

## 2014-03-09 DIAGNOSIS — I10 Essential (primary) hypertension: Secondary | ICD-10-CM | POA: Insufficient documentation

## 2014-03-09 DIAGNOSIS — Z79899 Other long term (current) drug therapy: Secondary | ICD-10-CM | POA: Insufficient documentation

## 2014-03-09 DIAGNOSIS — Z8739 Personal history of other diseases of the musculoskeletal system and connective tissue: Secondary | ICD-10-CM | POA: Insufficient documentation

## 2014-03-09 DIAGNOSIS — R0789 Other chest pain: Secondary | ICD-10-CM | POA: Insufficient documentation

## 2014-03-09 DIAGNOSIS — Z87891 Personal history of nicotine dependence: Secondary | ICD-10-CM | POA: Insufficient documentation

## 2014-03-09 DIAGNOSIS — Z85118 Personal history of other malignant neoplasm of bronchus and lung: Secondary | ICD-10-CM | POA: Insufficient documentation

## 2014-03-09 LAB — BASIC METABOLIC PANEL
BUN: 10 mg/dL (ref 6–23)
CALCIUM: 9.4 mg/dL (ref 8.4–10.5)
CO2: 23 mEq/L (ref 19–32)
Chloride: 104 mEq/L (ref 96–112)
Creatinine, Ser: 0.53 mg/dL (ref 0.50–1.10)
GFR, EST NON AFRICAN AMERICAN: 88 mL/min — AB (ref >90)
Glucose, Bld: 96 mg/dL (ref 70–99)
POTASSIUM: 3.3 meq/L — AB (ref 3.7–5.3)
SODIUM: 145 meq/L (ref 137–147)

## 2014-03-09 LAB — CBC
HCT: 43 % (ref 36.0–46.0)
Hemoglobin: 14.2 g/dL (ref 12.0–15.0)
MCH: 30 pg (ref 26.0–34.0)
MCHC: 33 g/dL (ref 30.0–36.0)
MCV: 90.9 fL (ref 78.0–100.0)
Platelets: 470 10*3/uL — ABNORMAL HIGH (ref 150–400)
RBC: 4.73 MIL/uL (ref 3.87–5.11)
RDW: 15.6 % — AB (ref 11.5–15.5)
WBC: 12.9 10*3/uL — ABNORMAL HIGH (ref 4.0–10.5)

## 2014-03-09 LAB — TROPONIN I: Troponin I: <0.3 ng/mL (ref <0.30)

## 2014-03-09 MED ORDER — IPRATROPIUM BROMIDE 0.02 % IN SOLN
0.5000 mg | Freq: Once | RESPIRATORY_TRACT | Status: AC
  Administered 2014-03-09: 0.5 mg via RESPIRATORY_TRACT
  Filled 2014-03-09: qty 2.5

## 2014-03-09 MED ORDER — ALBUTEROL SULFATE (2.5 MG/3ML) 0.083% IN NEBU
5.0000 mg | INHALATION_SOLUTION | Freq: Once | RESPIRATORY_TRACT | Status: AC
  Administered 2014-03-09: 5 mg via RESPIRATORY_TRACT
  Filled 2014-03-09: qty 6

## 2014-03-09 MED ORDER — ALBUTEROL SULFATE HFA 108 (90 BASE) MCG/ACT IN AERS: 2.0000 | INHALATION_SPRAY | RESPIRATORY_TRACT | Status: AC | PRN

## 2014-03-09 MED ORDER — POTASSIUM CHLORIDE CRYS ER 20 MEQ PO TBCR
40.0000 meq | EXTENDED_RELEASE_TABLET | Freq: Once | ORAL | Status: AC
Start: 2014-03-09 — End: 2014-03-09
  Administered 2014-03-09: 40 meq via ORAL
  Filled 2014-03-09: qty 2

## 2014-03-09 MED ORDER — PREDNISONE 20 MG PO TABS: 60.0000 mg | ORAL_TABLET | Freq: Every day | ORAL | Status: DC

## 2014-03-09 MED ORDER — PREDNISONE 20 MG PO TABS
60.0000 mg | ORAL_TABLET | Freq: Once | ORAL | Status: AC
  Administered 2014-03-09: 60 mg via ORAL
  Filled 2014-03-09: qty 3

## 2014-03-09 NOTE — ED Notes (Signed)
Bed: WA01 Expected date:  Expected time:  Means of arrival:  Comments: 

## 2014-03-09 NOTE — Discharge Instructions (Signed)
Take prednisone as prescribed. Use albuterol treatment as need if wheezing. Follow up with your doctor in the next couple days for recheck if symptoms fail to improve/resolve. Return to ER right away if worse, trouble breathing, persistent or recurrent chest pain, high fevers, weak/faint, other concern.    Chronic Obstructive Pulmonary Disease Chronic obstructive pulmonary disease (COPD) is a common lung condition in which airflow from the lungs is limited. COPD is a general term that can be used to describe many different lung problems that limit airflow, including both chronic bronchitis and emphysema. If you have COPD, your lung function will probably never return to normal, but there are measures you can take to improve lung function and make yourself feel better.  CAUSES   Smoking (common).   Exposure to secondhand smoke.   Genetic problems.  Chronic inflammatory lung diseases or recurrent infections. SYMPTOMS   Shortness of breath, especially with physical activity.   Deep, persistent (chronic) cough with a large amount of thick mucus.   Wheezing.   Rapid breaths (tachypnea).   Gray or bluish discoloration (cyanosis) of the skin, especially in fingers, toes, or lips.   Fatigue.   Weight loss.   Frequent infections or episodes when breathing symptoms become much worse (exacerbations).   Chest tightness. DIAGNOSIS  Your healthcare provider will take a medical history and perform a physical examination to make the initial diagnosis. Additional tests for COPD may include:   Lung (pulmonary) function tests.  Chest X-ray.  CT scan.  Blood tests. TREATMENT  Treatment available to help you feel better when you have COPD include:   Inhaler and nebulizer medicines. These help manage the symptoms of COPD and make your breathing more comfortable  Supplemental oxygen. Supplemental oxygen is only helpful if you have a low oxygen level in your blood.    Exercise and physical activity. These are beneficial for nearly all people with COPD. Some people may also benefit from a pulmonary rehabilitation program. HOME CARE INSTRUCTIONS   Take all medicines (inhaled or pills) as directed by your health care provider.  Only take over-the-counter or prescription medicines for pain, fever, or discomfort as directed by your health care provider.   Avoid over-the-counter medicines or cough syrups that dry up your airway (such as antihistamines) and slow down the elimination of secretions unless instructed otherwise by your healthcare provider.   If you are a smoker, the most important thing that you can do is stop smoking. Continuing to smoke will cause further lung damage and breathing trouble. Ask your health care provider for help with quitting smoking. He or she can direct you to community resources or hospitals that provide support.  Avoid exposure to irritants such as smoke, chemicals, and fumes that aggravate your breathing.  Use oxygen therapy and pulmonary rehabilitation if directed by your health care provider. If you require home oxygen therapy, ask your healthcare provider whether you should purchase a pulse oximeter to measure your oxygen level at home.   Avoid contact with individuals who have a contagious illness.  Avoid extreme temperature and humidity changes.  Eat healthy foods. Eating smaller, more frequent meals and resting before meals may help you maintain your strength.  Stay active, but balance activity with periods of rest. Exercise and physical activity will help you maintain your ability to do things you want to do.  Preventing infection and hospitalization is very important when you have COPD. Make sure to receive all the vaccines your health care provider recommends, especially  the pneumococcal and influenza vaccines. Ask your healthcare provider whether you need a pneumonia vaccine.  Learn and use relaxation  techniques to manage stress.  Learn and use controlled breathing techniques as directed by your health care provider. Controlled breathing techniques include:   Pursed lip breathing. Start by breathing in (inhaling) through your nose for 1 second. Then, purse your lips as if you were going to whistle and breathe out (exhale) through the pursed lips for 2 seconds.   Diaphragmatic breathing. Start by putting one hand on your abdomen just above your waist. Inhale slowly through your nose. The hand on your abdomen should move out. Then purse your lips and exhale slowly. You should be able to feel the hand on your abdomen moving in as you exhale.   Learn and use controlled coughing to clear mucus from your lungs. Controlled coughing is a series of short, progressive coughs. The steps of controlled coughing are:  1. Lean your head slightly forward.  2. Breathe in deeply using diaphragmatic breathing.  3. Try to hold your breath for 3 seconds.  4. Keep your mouth slightly open while coughing twice.  5. Spit any mucus out into a tissue.  6. Rest and repeat the steps once or twice as needed. SEEK MEDICAL CARE IF:   You are coughing up more mucus than usual.   There is a change in the color or thickness of your mucus.   Your breathing is more labored than usual.   Your breathing is faster than usual.  SEEK IMMEDIATE MEDICAL CARE IF:   You have shortness of breath while you are resting.   You have shortness of breath that prevents you from:  Being able to talk.   Performing your usual physical activities.   You have chest pain lasting longer than 5 minutes.   Your skin color is more cyanotic than usual.  You measure low oxygen saturations for longer than 5 minutes with a pulse oximeter. MAKE SURE YOU:   Understand these instructions.  Will watch your condition.  Will get help right away if you are not doing well or get worse. Document Released: 09/14/2005 Document  Revised: 09/25/2013 Document Reviewed: 08/01/2013 [facility] Patient Information 2014 [place], [place].

## 2014-03-09 NOTE — ED Provider Notes (Signed)
CSN: [id number]     Arrival date & time 03/09/14  1426 History   First MD Initiated Contact with Patient 03/09/14 1503     Chief Complaint  Patient presents with  . Shortness of Breath     (Consider location/radiation/quality/duration/timing/severity/associated sxs/prior Treatment) Patient is a 80 y.o. female presenting with shortness of breath. The history is provided by the patient.  Shortness of Breath Associated symptoms: cough and wheezing   Associated symptoms: no abdominal pain, no fever, no headaches, no neck pain, no rash, no sore throat and no vomiting   pt with hx copd, c/o non productive cough associated w increased wheezing, sob and chest tightness x 1 week. Cough episodic, wheezing/sob/tightness constant.  No episodic chest pain. No sore throat, runny nose or other uri c/o. No fever or chills. Taking normal meds, uses inhaler prn. No recent steroid use, 1 prior admit for copd. Was seen by pcp last week and given rx for abx, but not improved. No pleuritic pain. No leg pain or swelling, no orthopnea or pnd.     Past Medical History  Diagnosis Date  . lung ca dx'd 08/2010    xrt comp 10/2010  . Hypertension   . Lung cancer     LUL, LLL lung  . Osteoporosis   . Diverticulitis   . Chronic back pain    Past Surgical History  Procedure Laterality Date  . Hip arthroplasty  10/10/2012    Procedure: ARTHROPLASTY BIPOLAR HIP;  Surgeon: Mauri Pole, MD;  Location: [facility];  Service: Orthopedics;  Laterality: Left;  . Orif periprosthetic fracture Left 12/03/2012    Procedure: OPEN REDUCTION INTERNAL FIXATION (ORIF) PERIPROSTHETIC FRACTURE;  Surgeon: Mauri Pole, MD;  Location: [facility];  Service: Orthopedics;  Laterality: Left;  . Hip arthroplasty Right 08/09/2013    Procedure: RIGHT HIP HEMI-ARTHROPLASTY;  Surgeon: Mauri Pole, MD;  Location: [facility];  Service: Orthopedics;  Laterality: Right;   Family History  Problem Relation Age of Onset  . Cancer Mother     lung  .  Cancer Father     stomach   History  Substance Use Topics  . Smoking status: Former Smoker -- 1.00 packs/day for 33 years    Quit date: 05/07/2013  . Smokeless tobacco: Never Used  . Alcohol Use: No   OB History   Grav Para Term Preterm Abortions TAB SAB Ect Mult Living                 Review of Systems  Constitutional: Negative for fever and chills.  HENT: Negative for sore throat.   Eyes: Negative for redness.  Respiratory: Positive for cough, shortness of breath and wheezing.   Cardiovascular: Negative for leg swelling.  Gastrointestinal: Negative for vomiting and abdominal pain.  Genitourinary: Negative for flank pain.  Musculoskeletal: Negative for back pain and neck pain.  Skin: Negative for rash.  Neurological: Negative for headaches.  Hematological: Does not bruise/bleed easily.  Psychiatric/Behavioral: Negative for confusion.      Allergies  Aspirin; Augmentin; Erythromycin; Morphine and related; Oxycodone; Penicillins; and Ciprofloxacin  Home Medications   Current Outpatient Rx  Name  Route  Sig  Dispense  Refill  . acidophilus (RISAQUAD) CAPS capsule   Oral   Take 1 capsule by mouth daily.         Marland Kitchen albuterol (PROVENTIL HFA;VENTOLIN HFA) 108 (90 BASE) MCG/ACT inhaler   Inhalation   Inhale 2 puffs into the lungs every 6 (six) hours as needed. For  wheezing         . amLODipine (NORVASC) 10 MG tablet   Oral   Take 10 mg by mouth daily.         Marland Kitchen azithromycin (ZITHROMAX) 250 MG tablet   Oral   Take 250 mg by mouth daily.         . bisoprolol (ZEBETA) 5 MG tablet   Oral   Take 5 mg by mouth daily.          . budesonide-formoterol (SYMBICORT) 160-4.5 MCG/ACT inhaler   Inhalation   Inhale 2 puffs into the lungs 2 (two) times daily.         Marland Kitchen dextromethorphan (DELSYM) 30 MG/5ML liquid   Oral   Take 30 mg by mouth 2 (two) times daily as needed for cough.         . fexofenadine (ALLEGRA) 180 MG tablet   Oral   Take 180 mg by mouth  daily.         Marland Kitchen levothyroxine (SYNTHROID, LEVOTHROID) 50 MCG tablet   Oral   Take 50 mcg by mouth daily before breakfast.         . traMADol (ULTRAM) 50 MG tablet   Oral   Take 1 tablet (50 mg total) by mouth every 6 (six) hours as needed for pain.   30 tablet   0   . traZODone (DESYREL) 50 MG tablet   Oral   Take 50 mg by mouth at bedtime.         . fentaNYL (DURAGESIC - DOSED MCG/HR) 50 MCG/HR   Transdermal   Place 1 patch (50 mcg total) onto the skin every 3 (three) days.   5 patch   0    BP 162/95  Pulse 77  Temp(Src) 97.2 F (36.2 C) (Oral)  Resp 18  SpO2 98% Physical Exam  Nursing note and vitals reviewed. Constitutional: She appears well-developed and well-nourished. No distress.  HENT:  Mouth/Throat: Oropharynx is clear and moist.  Eyes: Conjunctivae are normal. No scleral icterus.  Neck: Neck supple. No JVD present. No tracheal deviation present.  Cardiovascular: Normal rate, regular rhythm, normal heart sounds and intact distal pulses.  Exam reveals no gallop and no friction rub.   No murmur heard. Pulmonary/Chest: Effort normal. She has wheezes.  Abdominal: Soft. Normal appearance and bowel sounds are normal. She exhibits no distension and no mass. There is no tenderness. There is no rebound and no guarding.  Musculoskeletal: She exhibits no edema and no tenderness.  Neurological: She is alert.  Skin: Skin is warm and dry. No rash noted. She is not diaphoretic.  Psychiatric: She has a normal mood and affect.    ED Course  Procedures (including critical care time)   Results for orders placed during the hospital encounter of 03/09/14  CBC      Result Value Ref Range   WBC 12.9 (*) 4.0 - 10.5 K/uL   RBC 4.73  3.87 - 5.11 MIL/uL   Hemoglobin 14.2  12.0 - 15.0 g/dL   HCT 43.0  36.0 - 46.0 %   MCV 90.9  78.0 - 100.0 fL   MCH 30.0  26.0 - 34.0 pg   MCHC 33.0  30.0 - 36.0 g/dL   RDW 15.6 (*) 11.5 - 15.5 %   Platelets 470 (*) 150 - 400 K/uL   BASIC METABOLIC PANEL      Result Value Ref Range   Sodium 145  137 - 147 mEq/L   Potassium 3.3 (*)  3.7 - 5.3 mEq/L   Chloride 104  96 - 112 mEq/L   CO2 23  19 - 32 mEq/L   Glucose, Bld 96  70 - 99 mg/dL   BUN 10  6 - 23 mg/dL   Creatinine, Ser 0.53  0.50 - 1.10 mg/dL   Calcium 9.4  8.4 - 10.5 mg/dL   GFR calc non Af Amer 88 (*) >90 mL/min   GFR calc Af Amer >90  >90 mL/min  TROPONIN I      Result Value Ref Range   Troponin I <0.30  <0.30 ng/mL   Dg Chest 2 View  03/09/2014   CLINICAL DATA:  Chest pain, shortness of breath, wheezing  EXAM: CHEST  2 VIEW  COMPARISON:  02/28/2014  FINDINGS: Postsurgical changes with associated scarring in the left upper and lower lobes.  Underlying chronic interstitial markings/ emphysematous changes. No pleural effusion or pneumothorax.  The heart is normal in size.  Associated lower thoracic kyphosis with prior vertebral augmentation involving 2 lower thoracolumbar vertebral bodies.  Left shoulder arthroplasty.  IMPRESSION: No evidence of acute cardiopulmonary disease.  Postsurgical changes with associated scarring in the left upper and lower lobes.   Electronically Signed   By: Julian Hy M.D.   On: 03/09/2014 15:51      EKG Interpretation   Date/Time:  Sunday March 09 2014 14:32:13 EDT Ventricular Rate:  66 PR Interval:  147 QRS Duration: 103 QT Interval:  446 QTC Calculation: 467 R Axis:   7 Text Interpretation:  Sinus rhythm Probable left atrial enlargement No  significant change since last tracing Confirmed by Corneisha Alvi  MD, Lennette Bihari  ([id number]) on 03/09/2014 3:11:25 PM      MDM  Iv ns. Labs. Cxr.  Albuterol and atrovent neb.  Persistent wheezing.  Alb and atrovent neb, pred.  Reviewed nursing notes and prior charts for additional history.   Recheck mild wheeze. Alb and atrovent neb.  Recheck wheezing resolved. Pt states feels she is breathing at baseline. Drinking coffee, no distress.  Pt states feels ready to go home, requests  d/c.   Chest cta. Pt appears stable for d/c.      Mirna Mires, MD 03/09/14 [phone]

## 2014-03-09 NOTE — ED Notes (Signed)
Patient states she feels a lot better and thinks she could go home.

## 2014-03-09 NOTE — ED Notes (Signed)
Pt w/ hx of URI. Was seen at [facility] on 3/13 and given an abx.  One dose left.  States Hx of COPD. States her SOB has been getting worse since Thursday.  Has been using inhaler.  Productive cough w/ green mucous.

## 2014-08-01 ENCOUNTER — Other Ambulatory Visit (HOSPITAL_BASED_OUTPATIENT_CLINIC_OR_DEPARTMENT_OTHER): Payer: Self-pay | Admitting: Family Medicine

## 2014-08-01 DIAGNOSIS — R0789 Other chest pain: Secondary | ICD-10-CM

## 2014-08-05 ENCOUNTER — Ambulatory Visit (HOSPITAL_BASED_OUTPATIENT_CLINIC_OR_DEPARTMENT_OTHER)
Admission: RE | Admit: 2014-08-05 | Discharge: 2014-08-05 | Disposition: A | Discharge status: Home / Self Care | Payer: Medicare Other | Source: Ambulatory Visit | Attending: Family Medicine | Admitting: Family Medicine

## 2014-08-05 ENCOUNTER — Encounter (HOSPITAL_BASED_OUTPATIENT_CLINIC_OR_DEPARTMENT_OTHER): Payer: Self-pay

## 2014-08-05 DIAGNOSIS — R0789 Other chest pain: Secondary | ICD-10-CM

## 2014-08-05 DIAGNOSIS — R918 Other nonspecific abnormal finding of lung field: Secondary | ICD-10-CM | POA: Diagnosis not present

## 2014-08-05 DIAGNOSIS — R937 Abnormal findings on diagnostic imaging of other parts of musculoskeletal system: Secondary | ICD-10-CM | POA: Insufficient documentation

## 2014-08-05 MED ORDER — IOHEXOL 350 MG/ML SOLN
100.0000 mL | Freq: Once | INTRAVENOUS | Status: AC | PRN
  Administered 2014-08-05: 100 mL via INTRAVENOUS

## 2014-08-07 ENCOUNTER — Ambulatory Visit (INDEPENDENT_AMBULATORY_CARE_PROVIDER_SITE_OTHER): Payer: Medicare Other | Admitting: Pulmonary Disease

## 2014-08-07 ENCOUNTER — Encounter: Payer: Self-pay | Admitting: Pulmonary Disease

## 2014-08-07 VITALS — BP 120/80 | HR 66 | Temp 97.8°F | Ht 60.0 in | Wt 89.2 lb

## 2014-08-07 DIAGNOSIS — C3490 Malignant neoplasm of unspecified part of unspecified bronchus or lung: Secondary | ICD-10-CM

## 2014-08-07 DIAGNOSIS — R9389 Abnormal findings on diagnostic imaging of other specified body structures: Secondary | ICD-10-CM | POA: Insufficient documentation

## 2014-08-07 DIAGNOSIS — C3492 Malignant neoplasm of unspecified part of left bronchus or lung: Secondary | ICD-10-CM

## 2014-08-07 NOTE — Assessment & Plan Note (Signed)
The patient has a history of lung cancer in the past, and now has a CT chest that shows extensive mediastinal lymphadenopathy and also bilateral nodular densities. More than likely this is a recurrence of her prior lung cancer, may represent a new small cell lung cancer with extensive mediastinal involvement.  I guess this could also be a lymphoproliferative disorder as well. At this point, I would like to schedule for a PET scan, and we'll send this note to her primary pulmonologist to decide about possible endobronchial ultrasound.

## 2014-08-07 NOTE — Progress Notes (Signed)
   Subjective:    Patient ID: [name], female    DOB: 10-07-1932, 80 y.o.   MRN: [id number]  HPI The patient comes in today for an acute sick visit. She recently had an acute exacerbation of her COPD and went to the emergency room for evaluation. They ultimately did a CT of her chest that showed extensive mediastinal adenopathy, as well as bilateral pulmonary nodular densities.  The patient does have a history of lung cancer in the past. She feels that her breathing has significantly improved, but she is not quite back to baseline.   Review of Systems  Constitutional: Negative for fever and unexpected weight change.  HENT: Negative for congestion, dental problem, ear pain, nosebleeds, postnasal drip, rhinorrhea, sinus pressure, sneezing, sore throat and trouble swallowing.   Eyes: Negative for redness and itching.  Respiratory: Positive for cough, chest tightness and shortness of breath. Negative for wheezing.   Cardiovascular: Positive for chest pain. Negative for palpitations and leg swelling.  Gastrointestinal: Negative for nausea and vomiting.  Genitourinary: Negative for dysuria.  Musculoskeletal: Negative for joint swelling.  Skin: Negative for rash.  Neurological: Negative for headaches.  Hematological: Does not bruise/bleed easily.  Psychiatric/Behavioral: Negative for dysphoric mood. The patient is not nervous/anxious.        Objective:   Physical Exam Thin female in no acute distress Nose without purulence or discharge noted Neck without lymphadenopathy or thyromegaly Chest with decreased breath sounds, a few basilar crackles Cardiac exam with regular rate and rhythm Lower extremities without significant edema, no cyanosis Alert and oriented, moves all 4 extremities.       Assessment & Plan:

## 2014-08-07 NOTE — Patient Instructions (Signed)
Will schedule for PET scan to evaluate the abnormalities on your ct chest. Will send a note to Dr. Chase Caller, so he can review your scan and make a decision about where to biopsy.  No change in your breathing medications.

## 2014-08-11 ENCOUNTER — Telehealth: Payer: Self-pay | Admitting: Internal Medicine

## 2014-08-11 NOTE — Telephone Encounter (Signed)
I would rather not take her on as a new patient.

## 2014-08-11 NOTE — Telephone Encounter (Signed)
[name]   I last saw this patient in 2011 I believe and had nodules. RB did bronch  ENB (early days of ENB) and dx with lung cancer. After that If I rembmer right they followed with RB/onc. I never saw patient subsequently. I vaguely remember them wanting to switch at that time. Next documentation is 07/08/13 that they want to switch to you after some office visit with you. Wondered if you could just follow her for her current problem of mediastinal nodes. They are a nice family. Probably better that they see someone they are comfortable with. LEt me know how you feel   Thanks  Dr. Brand Males, M.D., [facility].C.P Pulmonary and Critical Care Medicine Staff Physician [place] [facility] Pager: [phone], If no answer or between  15:00h - 7:00h: call [phone]  08/11/2014 6:01 AM

## 2014-08-14 ENCOUNTER — Encounter (HOSPITAL_COMMUNITY): Payer: Self-pay

## 2014-08-14 ENCOUNTER — Ambulatory Visit (HOSPITAL_COMMUNITY)
Admission: RE | Admit: 2014-08-14 | Discharge: 2014-08-14 | Disposition: A | Discharge status: Home / Self Care | Payer: Medicare Other | Source: Ambulatory Visit | Attending: Pulmonary Disease | Admitting: Pulmonary Disease

## 2014-08-14 DIAGNOSIS — R9389 Abnormal findings on diagnostic imaging of other specified body structures: Secondary | ICD-10-CM

## 2014-08-14 DIAGNOSIS — R220 Localized swelling, mass and lump, head: Secondary | ICD-10-CM | POA: Insufficient documentation

## 2014-08-14 DIAGNOSIS — C3492 Malignant neoplasm of unspecified part of left bronchus or lung: Secondary | ICD-10-CM

## 2014-08-14 DIAGNOSIS — R221 Localized swelling, mass and lump, neck: Secondary | ICD-10-CM

## 2014-08-14 DIAGNOSIS — R918 Other nonspecific abnormal finding of lung field: Secondary | ICD-10-CM | POA: Diagnosis not present

## 2014-08-14 DIAGNOSIS — K7689 Other specified diseases of liver: Secondary | ICD-10-CM | POA: Diagnosis not present

## 2014-08-14 DIAGNOSIS — C3490 Malignant neoplasm of unspecified part of unspecified bronchus or lung: Secondary | ICD-10-CM | POA: Insufficient documentation

## 2014-08-14 LAB — GLUCOSE, CAPILLARY: GLUCOSE-CAPILLARY: 66 mg/dL — AB (ref 70–99)

## 2014-08-14 MED ORDER — FLUDEOXYGLUCOSE F - 18 (FDG) INJECTION: 5.0000 | Freq: Once | INTRAVENOUS | Status: AC | PRN

## 2014-08-18 ENCOUNTER — Telehealth: Payer: Self-pay | Admitting: Internal Medicine

## 2014-08-18 NOTE — Telephone Encounter (Signed)
Patient down to 82#. Son thinks she is depressed. Wants to know prognosis . Told him likely lung cancer recurrence and ideally needs bx. I want to eval and do goals of care about bx v other optins. He will bring her in 08/20/14. Plese give appt

## 2014-08-18 NOTE — Telephone Encounter (Signed)
Pt son will keep scheduled appt for 08/20/14

## 2014-08-18 NOTE — Telephone Encounter (Signed)
Called spoke with pt. appt scheduled for 08/20/14 at 4:30

## 2014-08-18 NOTE — Telephone Encounter (Signed)
Called spoke with pt son. He reports he is not sure if pt can make it to the appt 08/20/14. Pt has not been able to eat and feeling nauseated. PCP called something in for pt. They are anxious about the results and does not feel they can wait until 08/20/14. They want some type of insight of the results. He takes care of her and is taking this as worse case scenario.  Pt son cell phone # is [phone]. Please advise MR thanks

## 2014-08-18 NOTE — Telephone Encounter (Signed)
Elise/Triage (sending to both)  Thsi patient PET scan 08/14/14  is abnormal for PET hot lymph glands. She needs to come in and see me. Please add on towards end of afternoon 08/20/14. I have 11 patients that day and ok to add her on. I need to discuss results and possibly schedule eBUS.   Thanks  Dr. Brand Males, M.D., [facility].C.P Pulmonary and Critical Care Medicine Staff Physician [place] [facility] Pager: [phone], If no answer or between  15:00h - 7:00h: call [phone]  08/18/2014 3:00 PM

## 2014-08-20 ENCOUNTER — Encounter: Payer: Self-pay | Admitting: Internal Medicine

## 2014-08-20 ENCOUNTER — Ambulatory Visit (INDEPENDENT_AMBULATORY_CARE_PROVIDER_SITE_OTHER): Payer: Medicare Other | Admitting: Internal Medicine

## 2014-08-20 VITALS — BP 132/72 | HR 71 | Ht 60.0 in | Wt 86.0 lb

## 2014-08-20 DIAGNOSIS — R590 Localized enlarged lymph nodes: Secondary | ICD-10-CM

## 2014-08-20 DIAGNOSIS — C3492 Malignant neoplasm of unspecified part of left bronchus or lung: Secondary | ICD-10-CM

## 2014-08-20 DIAGNOSIS — C3490 Malignant neoplasm of unspecified part of unspecified bronchus or lung: Secondary | ICD-10-CM

## 2014-08-20 DIAGNOSIS — R599 Enlarged lymph nodes, unspecified: Secondary | ICD-10-CM

## 2014-08-20 NOTE — Patient Instructions (Signed)
You have enlarged lymph glands in chest Concern is for lung cancer spread You need biopsy - EBUS bronch to figure out what this is Do blood test when feasible I am trying to get EBUS bronch for Monday 9/14;/15 or that week; will let you know in 24h

## 2014-08-20 NOTE — Progress Notes (Signed)
   Subjective:    Patient ID: [name], female    DOB: [birth year], 80 y.o.   MRN: [id number]  HPI   OV 08/20/2014  Chief Complaint  Patient presents with  . Follow-up    ov to discuss PET results - does report some difficulty taking deep breaths with burning in chest worse since last ov.    Patient with prior lung cancer s/p XRT several years ago. Recent AECOPD resulted in CT chest in ER that showed pathological mediastinal nodes. Underwent PET scan 08/14/14 and these nodes are PET hot. Here with son to discuss results. She looks the same like few years ago. Son very involved in her care. She wants to find out diagnosis. She is aware that ddx is sarcoid, reactive nodes and lymphoma and lung cancer and sarcoid. No new issues  Review of Systems  Constitutional: Negative for fever and unexpected weight change.  HENT: Negative for congestion, dental problem, ear pain, nosebleeds, postnasal drip, rhinorrhea, sinus pressure, sneezing, sore throat and trouble swallowing.   Eyes: Negative for redness and itching.  Respiratory: Positive for cough and shortness of breath. Negative for chest tightness and wheezing.   Cardiovascular: Negative for palpitations and leg swelling.  Gastrointestinal: Negative for nausea and vomiting.  Genitourinary: Negative for dysuria.  Musculoskeletal: Negative for joint swelling.  Skin: Negative for rash.  Neurological: Negative for headaches.  Hematological: Does not bruise/bleed easily.  Psychiatric/Behavioral: Negative for dysphoric mood. The patient is not nervous/anxious.        Objective:   Physical Exam  Filed Vitals:   08/20/14 1701  BP: 132/72  Pulse: 71  Height: 5' (1.524 m)  Weight: 86 lb (39.009 kg)  SpO2: 97%   Body mass index is 16.8 kg/(m^2).   Thin female in no acute distress; son at chair side in exam room Nose without purulence or discharge noted Neck without lymphadenopathy or thyromegaly Chest with decreased breath  sounds, clear today (crackles prior exam) Cardiac exam with regular rate and rhythm Lower extremities without significant edema, no cyanosis Alert and oriented, moves all 4 extremities.     Assessment & Plan:  You have enlarged lymph glands in chest Concern is for lung cancer spread You need biopsy - EBUS bronch to figure out what this is Do blood test when feasible I am trying to get EBUS bronch for Monday 9/14;/15 or that week; will let you know in 24h

## 2014-08-21 ENCOUNTER — Encounter (HOSPITAL_COMMUNITY): Payer: Self-pay | Admitting: Pharmacy Technician

## 2014-08-21 ENCOUNTER — Telehealth: Payer: Self-pay | Admitting: Internal Medicine

## 2014-08-21 DIAGNOSIS — J984 Other disorders of lung: Secondary | ICD-10-CM

## 2014-08-21 NOTE — Telephone Encounter (Signed)
[name]  Let .[name] and her son know that EBU Sset for 09/01/14. She should have cbc, bmet, pt and ptt prior to procedure - do week of 08/26/14  Thanks  Dr. Brand Males, M.D., F.C.C.P Pulmonary and Critical Care Medicine Staff Physician [place] [facility] Pager: [phone], If no answer or between  15:00h - 7:00h: call [phone]  08/21/2014 6:03 PM

## 2014-08-22 NOTE — Telephone Encounter (Signed)
Called and spoke to pt. Informed pt of recs per MR. Pt aware of time, date and location of procedure and pt is also aware to have blood work done the week before at the office. Pt verbalized understanding and denied any further questions or concerns at this time. Lab orders already placed.

## 2014-08-27 ENCOUNTER — Other Ambulatory Visit (INDEPENDENT_AMBULATORY_CARE_PROVIDER_SITE_OTHER): Payer: Medicare Other

## 2014-08-27 DIAGNOSIS — C3492 Malignant neoplasm of unspecified part of left bronchus or lung: Secondary | ICD-10-CM

## 2014-08-27 DIAGNOSIS — R590 Localized enlarged lymph nodes: Secondary | ICD-10-CM

## 2014-08-27 DIAGNOSIS — C3490 Malignant neoplasm of unspecified part of unspecified bronchus or lung: Secondary | ICD-10-CM

## 2014-08-27 DIAGNOSIS — R599 Enlarged lymph nodes, unspecified: Secondary | ICD-10-CM

## 2014-08-27 LAB — BASIC METABOLIC PANEL
BUN: 12 mg/dL (ref 6–23)
CO2: 27 mEq/L (ref 19–32)
CREATININE: 0.6 mg/dL (ref 0.4–1.2)
Calcium: 9.1 mg/dL (ref 8.4–10.5)
Chloride: 105 mEq/L (ref 96–112)
GFR: 102.24 mL/min (ref >60.00)
Glucose, Bld: 81 mg/dL (ref 70–99)
POTASSIUM: 4.5 meq/L (ref 3.5–5.1)
Sodium: 139 mEq/L (ref 135–145)

## 2014-08-27 LAB — CBC WITH DIFFERENTIAL/PLATELET
BASOS ABS: 0.1 10*3/uL (ref 0.0–0.1)
Basophils Relative: 0.6 % (ref 0.0–3.0)
EOS PCT: 1.4 % (ref 0.0–5.0)
Eosinophils Absolute: 0.1 10*3/uL (ref 0.0–0.7)
HCT: 44.3 % (ref 36.0–46.0)
Hemoglobin: 14.2 g/dL (ref 12.0–15.0)
Lymphocytes Relative: 16.7 % (ref 12.0–46.0)
Lymphs Abs: 1.7 10*3/uL (ref 0.7–4.0)
MCHC: 32 g/dL (ref 30.0–36.0)
MCV: 92 fl (ref 78.0–100.0)
MONO ABS: 0.4 10*3/uL (ref 0.1–1.0)
MONOS PCT: 4.4 % (ref 3.0–12.0)
NEUTROS PCT: 76.9 % (ref 43.0–77.0)
Neutro Abs: 7.9 10*3/uL — ABNORMAL HIGH (ref 1.4–7.7)
PLATELETS: 418 10*3/uL — AB (ref 150.0–400.0)
RBC: 4.81 Mil/uL (ref 3.87–5.11)
RDW: 14.3 % (ref 11.5–15.5)
WBC: 10.3 10*3/uL (ref 4.0–10.5)

## 2014-08-27 LAB — PROTIME-INR
INR: 1.1 ratio — AB (ref 0.8–1.0)
PROTHROMBIN TIME: 11.9 s (ref 9.6–13.1)

## 2014-08-27 LAB — APTT: aPTT: 31.5 s (ref 23.4–32.7)

## 2014-08-28 ENCOUNTER — Encounter (HOSPITAL_COMMUNITY): Payer: Self-pay | Admitting: *Deleted

## 2014-08-28 NOTE — Progress Notes (Signed)
Quick Note:  Called and spoke to pt's son, Zenia Resides. Zenia Resides verbalized understanding and denied any further questions or concerns at this time. ______

## 2014-08-31 NOTE — Anesthesia Preprocedure Evaluation (Signed)
Anesthesia Evaluation  Patient identified by MRN, date of birth, ID band Patient awake    Reviewed: Allergy & Precautions, H&P , NPO status , Patient's Chart, lab work & pertinent test results  Airway Mallampati: II TM Distance: >3 FB Neck ROM: Full    Dental  (+) Edentulous Upper, Edentulous Lower   Pulmonary shortness of breath and with exertion, asthma , pneumonia -, COPD COPD inhaler, Current Smoker, former smoker,  Hx of lung CA breath sounds clear to auscultation- rhonchi  Pulmonary exam normal + decreased breath sounds      Cardiovascular hypertension, Pt. on medications and Pt. on home beta blockers Rhythm:Regular Rate:Normal     Neuro/Psych Depression Chronic low back pain negative neurological ROS     GI/Hepatic negative GI ROS, Neg liver ROS,   Endo/Other  Hypothyroidism   Renal/GU negative Renal ROS  negative genitourinary   Musculoskeletal negative musculoskeletal ROS (+)   Abdominal (+) + scaphoid   Peds negative pediatric ROS (+)  Hematology negative hematology ROS (+)   Anesthesia Other Findings   Reproductive/Obstetrics negative OB ROS                           Anesthesia Physical  Anesthesia Plan  ASA: III  Anesthesia Plan: General   Post-op Pain Management:    Induction: Intravenous  Airway Management Planned: Oral ETT  Additional Equipment:   Intra-op Plan:   Post-operative Plan: Extubation in OR  Informed Consent: I have reviewed the patients History and Physical, chart, labs and discussed the procedure including the risks, benefits and alternatives for the proposed anesthesia with the patient or authorized representative who has indicated his/her understanding and acceptance.   Dental advisory given  Plan Discussed with: CRNA  Anesthesia Plan Comments:         Anesthesia Quick Evaluation

## 2014-09-01 ENCOUNTER — Ambulatory Visit (HOSPITAL_COMMUNITY)
Admission: RE | Admit: 2014-09-01 | Discharge: 2014-09-01 | Disposition: A | Discharge status: Home / Self Care | Payer: Medicare Other | Source: Ambulatory Visit | Attending: Internal Medicine | Admitting: Internal Medicine

## 2014-09-01 ENCOUNTER — Encounter (HOSPITAL_COMMUNITY)
Admission: RE | Disposition: A | Discharge status: Home / Self Care | Payer: Self-pay | Source: Ambulatory Visit | Attending: Internal Medicine

## 2014-09-01 ENCOUNTER — Ambulatory Visit (HOSPITAL_COMMUNITY): Payer: Medicare Other | Admitting: Anesthesiology

## 2014-09-01 ENCOUNTER — Encounter (HOSPITAL_COMMUNITY): Payer: Self-pay | Admitting: *Deleted

## 2014-09-01 ENCOUNTER — Encounter (HOSPITAL_COMMUNITY): Payer: Medicare Other | Admitting: Anesthesiology

## 2014-09-01 DIAGNOSIS — M545 Low back pain, unspecified: Secondary | ICD-10-CM | POA: Insufficient documentation

## 2014-09-01 DIAGNOSIS — I10 Essential (primary) hypertension: Secondary | ICD-10-CM | POA: Insufficient documentation

## 2014-09-01 DIAGNOSIS — Z923 Personal history of irradiation: Secondary | ICD-10-CM | POA: Insufficient documentation

## 2014-09-01 DIAGNOSIS — G8929 Other chronic pain: Secondary | ICD-10-CM | POA: Diagnosis not present

## 2014-09-01 DIAGNOSIS — Z85118 Personal history of other malignant neoplasm of bronchus and lung: Secondary | ICD-10-CM | POA: Insufficient documentation

## 2014-09-01 DIAGNOSIS — J449 Chronic obstructive pulmonary disease, unspecified: Secondary | ICD-10-CM | POA: Insufficient documentation

## 2014-09-01 DIAGNOSIS — R599 Enlarged lymph nodes, unspecified: Secondary | ICD-10-CM | POA: Diagnosis present

## 2014-09-01 DIAGNOSIS — F329 Major depressive disorder, single episode, unspecified: Secondary | ICD-10-CM | POA: Diagnosis not present

## 2014-09-01 DIAGNOSIS — F3289 Other specified depressive episodes: Secondary | ICD-10-CM | POA: Diagnosis not present

## 2014-09-01 DIAGNOSIS — R590 Localized enlarged lymph nodes: Secondary | ICD-10-CM

## 2014-09-01 DIAGNOSIS — C3490 Malignant neoplasm of unspecified part of unspecified bronchus or lung: Secondary | ICD-10-CM

## 2014-09-01 DIAGNOSIS — F17200 Nicotine dependence, unspecified, uncomplicated: Secondary | ICD-10-CM | POA: Diagnosis not present

## 2014-09-01 DIAGNOSIS — C771 Secondary and unspecified malignant neoplasm of intrathoracic lymph nodes: Secondary | ICD-10-CM | POA: Diagnosis not present

## 2014-09-01 DIAGNOSIS — E039 Hypothyroidism, unspecified: Secondary | ICD-10-CM | POA: Insufficient documentation

## 2014-09-01 DIAGNOSIS — J4489 Other specified chronic obstructive pulmonary disease: Secondary | ICD-10-CM | POA: Insufficient documentation

## 2014-09-01 HISTORY — PX: ENDOBRONCHIAL ULTRASOUND: SHX5096

## 2014-09-01 HISTORY — DX: Nausea: R11.0

## 2014-09-01 HISTORY — DX: Anxiety disorder, unspecified: F41.9

## 2014-09-01 HISTORY — DX: Headache: R51

## 2014-09-01 HISTORY — DX: Chronic obstructive pulmonary disease, unspecified: J44.9

## 2014-09-01 HISTORY — DX: Unspecified multiple injuries, initial encounter: T07.XXXA

## 2014-09-01 HISTORY — DX: Hypothyroidism, unspecified: E03.9

## 2014-09-01 HISTORY — DX: Unspecified asthma, uncomplicated: J45.909

## 2014-09-01 HISTORY — DX: Other injury of unspecified body region, initial encounter: T14.8XXA

## 2014-09-01 SURGERY — ENDOBRONCHIAL ULTRASOUND (EBUS)
Anesthesia: General | Laterality: Bilateral

## 2014-09-01 MED ORDER — DEXAMETHASONE SODIUM PHOSPHATE 10 MG/ML IJ SOLN
INTRAMUSCULAR | Status: DC | PRN
  Administered 2014-09-01: 4 mg via INTRAVENOUS

## 2014-09-01 MED ORDER — PROMETHAZINE HCL 25 MG/ML IJ SOLN: 6.2500 mg | INTRAMUSCULAR | Status: DC | PRN

## 2014-09-01 MED ORDER — LACTATED RINGERS IV SOLN
INTRAVENOUS | Status: DC
  Administered 2014-09-01: 1000 mL via INTRAVENOUS

## 2014-09-01 MED ORDER — PROPOFOL 10 MG/ML IV BOLUS
INTRAVENOUS | Status: AC
Start: 2014-09-01 — End: 2014-09-01
  Filled 2014-09-01: qty 20

## 2014-09-01 MED ORDER — EPHEDRINE SULFATE 50 MG/ML IJ SOLN
INTRAMUSCULAR | Status: DC | PRN
  Administered 2014-09-01 (×2): 10 mg via INTRAVENOUS

## 2014-09-01 MED ORDER — FENTANYL CITRATE 0.05 MG/ML IJ SOLN
INTRAMUSCULAR | Status: AC
  Filled 2014-09-01: qty 2

## 2014-09-01 MED ORDER — ROCURONIUM BROMIDE 100 MG/10ML IV SOLN
INTRAVENOUS | Status: AC
Start: 2014-09-01 — End: 2014-09-01
  Filled 2014-09-01: qty 1

## 2014-09-01 MED ORDER — FENTANYL CITRATE 0.05 MG/ML IJ SOLN
INTRAMUSCULAR | Status: DC | PRN
  Administered 2014-09-01 (×2): 50 ug via INTRAVENOUS

## 2014-09-01 MED ORDER — DEXAMETHASONE SODIUM PHOSPHATE 10 MG/ML IJ SOLN
INTRAMUSCULAR | Status: AC
  Filled 2014-09-01: qty 1

## 2014-09-01 MED ORDER — SODIUM CHLORIDE 0.9 % IJ SOLN
INTRAMUSCULAR | Status: AC
Start: 2014-09-01 — End: 2014-09-01
  Filled 2014-09-01: qty 20

## 2014-09-01 MED ORDER — ONDANSETRON HCL 4 MG/2ML IJ SOLN
INTRAMUSCULAR | Status: DC | PRN
  Administered 2014-09-01: 4 mg via INTRAVENOUS

## 2014-09-01 MED ORDER — REMIFENTANIL HCL 1 MG IV SOLR
INTRAVENOUS | Status: DC | PRN
  Administered 2014-09-01: .5 ug/kg/min via INTRAVENOUS

## 2014-09-01 MED ORDER — ONDANSETRON HCL 4 MG/2ML IJ SOLN
INTRAMUSCULAR | Status: AC
  Filled 2014-09-01: qty 2

## 2014-09-01 MED ORDER — ATROPINE SULFATE 0.4 MG/ML IJ SOLN
INTRAMUSCULAR | Status: AC
  Filled 2014-09-01: qty 1

## 2014-09-01 MED ORDER — LIDOCAINE HCL (CARDIAC) 20 MG/ML IV SOLN
INTRAVENOUS | Status: AC
  Filled 2014-09-01: qty 5

## 2014-09-01 MED ORDER — MEPERIDINE HCL 100 MG/ML IJ SOLN: 6.2500 mg | INTRAMUSCULAR | Status: DC | PRN

## 2014-09-01 MED ORDER — SODIUM CHLORIDE 0.9 % IJ SOLN
INTRAMUSCULAR | Status: AC
  Filled 2014-09-01: qty 10

## 2014-09-01 MED ORDER — LIDOCAINE HCL (PF) 2 % IJ SOLN
INTRAMUSCULAR | Status: DC | PRN
  Administered 2014-09-01: 75 mg via INTRADERMAL

## 2014-09-01 MED ORDER — PROPOFOL 10 MG/ML IV BOLUS
INTRAVENOUS | Status: DC | PRN
  Administered 2014-09-01: 100 mg via INTRAVENOUS

## 2014-09-01 MED ORDER — EPHEDRINE SULFATE 50 MG/ML IJ SOLN
INTRAMUSCULAR | Status: AC
  Filled 2014-09-01: qty 1

## 2014-09-01 MED ORDER — SUCCINYLCHOLINE CHLORIDE 20 MG/ML IJ SOLN
INTRAMUSCULAR | Status: DC | PRN
  Administered 2014-09-01: 80 mg via INTRAVENOUS

## 2014-09-01 MED ORDER — REMIFENTANIL HCL 1 MG IV SOLR
INTRAVENOUS | Status: AC
  Filled 2014-09-01: qty 1000

## 2014-09-01 NOTE — Transfer of Care (Signed)
Immediate Anesthesia Transfer of Care Note  Patient: [name]  Procedure(s) Performed: Procedure(s): ENDOBRONCHIAL ULTRASOUND (Bilateral)  Patient Location: PACU and Endoscopy Unit  Anesthesia Type:General  Level of Consciousness: awake, sedated and responds to stimulation  Airway & Oxygen Therapy: Patient Spontanous Breathing and Patient connected to face mask oxygen  Post-op Assessment: Report given to PACU RN, Post -op Vital signs reviewed and stable and Patient moving all extremities X 4  Post vital signs: Reviewed and stable  Complications: No apparent anesthesia complications

## 2014-09-01 NOTE — Progress Notes (Addendum)
Name:  [name] MRN:  [id number] DOB:  20-Jul-1932  PROCEDURE NOTE  Procedure(s): Flexible bronchoscopy [phone]) Endobronchial ultrasound ([id number]) Transbronchial needle aspiration ([id number]) of the STATION 7 SUB-CARINAL NODE   Indications:  Hilar / mediastinal lymphadenopathy and Hx of Lung Cancer  Consent:  Procedure, benefits, risks and alternatives discussed.  Questions answered.  Consent obtained.  Anesthesia:  General endotracheal.  Procedure summary:  Appropriate equipment was assembled.  The patient was brought to the operating room and identified as [name].  Safety timeout was performed. The patient was placed supine on the operating table, airway established and general anesthesia administered by Anesthesia team.   After the appropriate level of anesthesia was assured, flexible video bronchoscope was lubricated and inserted through the endotracheal tube.  Total of 0 mL of 1% Lidocaine were administered through the bronchoscope to augment general anesthesia.  Airway examination was performed bilaterally to subsegmental level.  Minimal clear secretions were noted, mucosa appeared normal and no endobronchial lesions were identified. The RLL airway looked chronically indurated presumably from prior XRT   Endobronchial ultrasound video bronchoscope was then lubricated and inserted through the endotracheal tube. Surveillance of all  the mediastinal and and bilateral hilar lymph node stations was NOT  Performed.  Attention was simply turned to STATION 7 subcarcinal node which was pathologically enlarged   Endobronchial ultrasound guided transbronchial needle aspiration of station 7 sub-carinal (passes  X 3 for slide and then passes x 4 for cell block), was performed, after which EBUS bronchoscope was withdrawn. This is because at end of this cytologist called back with positive diagnosis of malignancy nos   The patient was extubated in operating room and transferred to  recovery room  Post-procedure chest x-ray was NOT  ordered.  Specimens sent: EBUS station 7 node for cytology    Complications:  No immediate complications were noted.  Hemodynamic parameters and oxygenation remained stable throughout the procedure.  Estimated blood loss:  Less then 2 mL   IMPRESSION 1 Hx of lung cancer 2 Now with Pathologically enlarged mediastinal nodes 3. S/op subcarinal TBNA EBUS bx - positive for cancer nos on site  POST PROCEDURE PLAN 1. Await final confirmation 3. Updated son post procedure - diagnosis given of presumed stage 4 lung cancer - he wants phone call and referral to onc ahead of #3 below thought he will keep appt with pulm for reasons of symptom mgmt 2. 09/09/14 14.15h with Rexene Edison the NP to discus symptoms and diagnosis  Future Appointments Date Time Provider [place]  09/09/2014 2:15 PM Tammy Jeralene Huff, NP [facility] None     Dr. Brand Males, M.D., [facility].C.P Pulmonary and Critical Care Medicine Staff Physician [place] [facility] Pager: [phone], If no answer or between  15:00h - 7:00h: call [phone]  09/01/2014 8:48 AM

## 2014-09-01 NOTE — H&P (View-Only) (Signed)
   Subjective:    Patient ID: [name], female    DOB: 05-Nov-1932, 80 y.o.   MRN: [id number]  HPI   OV 08/20/2014  Chief Complaint  Patient presents with  . Follow-up    ov to discuss PET results - does report some difficulty taking deep breaths with burning in chest worse since last ov.    Patient with prior lung cancer s/p XRT several years ago. Recent AECOPD resulted in CT chest in ER that showed pathological mediastinal nodes. Underwent PET scan 08/14/14 and these nodes are PET hot. Here with son to discuss results. She looks the same like few years ago. Son very involved in her care. She wants to find out diagnosis. She is aware that ddx is sarcoid, reactive nodes and lymphoma and lung cancer and sarcoid. No new issues  Review of Systems  Constitutional: Negative for fever and unexpected weight change.  HENT: Negative for congestion, dental problem, ear pain, nosebleeds, postnasal drip, rhinorrhea, sinus pressure, sneezing, sore throat and trouble swallowing.   Eyes: Negative for redness and itching.  Respiratory: Positive for cough and shortness of breath. Negative for chest tightness and wheezing.   Cardiovascular: Negative for palpitations and leg swelling.  Gastrointestinal: Negative for nausea and vomiting.  Genitourinary: Negative for dysuria.  Musculoskeletal: Negative for joint swelling.  Skin: Negative for rash.  Neurological: Negative for headaches.  Hematological: Does not bruise/bleed easily.  Psychiatric/Behavioral: Negative for dysphoric mood. The patient is not nervous/anxious.        Objective:   Physical Exam  Filed Vitals:   08/20/14 1701  BP: 132/72  Pulse: 71  Height: 5' (1.524 m)  Weight: 86 lb (39.009 kg)  SpO2: 97%   Body mass index is 16.8 kg/(m^2).   Thin female in no acute distress; son at chair side in exam room Nose without purulence or discharge noted Neck without lymphadenopathy or thyromegaly Chest with decreased breath  sounds, clear today (crackles prior exam) Cardiac exam with regular rate and rhythm Lower extremities without significant edema, no cyanosis Alert and oriented, moves all 4 extremities.     Assessment & Plan:  You have enlarged lymph glands in chest Concern is for lung cancer spread You need biopsy - EBUS bronch to figure out what this is Do blood test when feasible I am trying to get EBUS bronch for Monday 9/14;/15 or that week; will let you know in 24h

## 2014-09-01 NOTE — Anesthesia Postprocedure Evaluation (Signed)
Anesthesia Post Note  Patient: [name]  Procedure(s) Performed: Procedure(s) (LRB): ENDOBRONCHIAL ULTRASOUND (Bilateral)  Anesthesia type: General  Patient location: PACU  Post pain: Pain level controlled  Post assessment: Post-op Vital signs reviewed  Last Vitals: BP 194/69  Pulse 81  Temp(Src) 36.7 C (Oral)  Resp 24  SpO2 100%  Post vital signs: Reviewed  Level of consciousness: sedated  Complications: No apparent anesthesia complications

## 2014-09-01 NOTE — Interval H&P Note (Signed)
Pre procedure note 09/01/2014    - PRocedure is EBUS TBNA biopsy of large PET hot mediastinal and hilar nodes esp subcarinal area. Wil be using vide bronch flex bronch  - Since last visit < 30 days ag0:  Following changes noted   - c/ow 2 week hx of sore throat that is intermittent but without fever, chills, nausea, vomit, sputum, rigors. No thrush on exam   - - 1 week hx of atypical chest pain left precordial , epigstrirum and right side - at rest, intermittent, not exertional. Uncler releiving facotrs. EKG done now nil acute   EXAM Vitas reviewed - stable  Gen: frail cachectic like before without change HEENT: No thrush, supple neck, no nodes, PEERL + CNS: Axox3. Speech normal. No focal deficits Lungs: CTA bialterally CVS: S1S2+. No murmur PA: soft, non tender EXt: no cyanosis, no clubbing, no edema  LABS Ekg 09/01/2014 - normal Normal cbc, bmet, inr 08/27/14   A Lung cancer hx MEdiastinal nodes - PET hot - new  P Patient can undergo procedure ADvised symptoms to be adddresed post procedure during followup due to chroncitiy of these  Risks of pneumothorax, hemothorax, sedation/anesthesia complications such as cardiac or respiratory arrest or hypotension, stroke and bleeding all explained. Benefits of diagnosis but limitations of non-diagnosis also explained. Patient verbalized understanding and wished to proceed.    This was also explained to son at prior visit   Dr. Brand Males, M.D., [facility].C.P Pulmonary and Critical Care Medicine Staff Physician [facility] Pager: [phone], If no answer or between  15:00h - 7:00h: call [phone]  09/01/2014 7:39 AM

## 2014-09-01 NOTE — Discharge Instructions (Addendum)
Please have someone to drive you home Please be careful with activities for next 24 hours You can eat 2-4 hours after getting home provided you are fully alert, able to cough, and are not nauseated or vomiting and     feel well You are expected to have low grade fever or cough some amount of blood for next 24-48 hours; if this worsens call us IF you are very short of breath or coughing blood or chest pain or not feeling well, call us [phone] anytime or go to emergency room I will call your son Mr Emilea Goga with formal diagnosis in next few days   Future Appointments Date Time Provider [place]  09/09/2014 2:15 PM Melvenia Needles, NP [facility] None   Flexible Bronchoscopy, Care After Refer to this sheet in the next few weeks. These instructions provide you with information on caring for yourself after your procedure. Your health care provider may also give you more specific instructions. Your treatment has been planned according to current medical practices, but problems sometimes occur. Call your health care provider if you have any problems or questions after your procedure.  WHAT TO EXPECT AFTER THE PROCEDURE It is normal to have the following symptoms for 24-48 hours after the procedure:   Increased cough.  Low-grade fever.  Sore throat or hoarse voice.  Small streaks of blood in your thick spit (sputum) if tissue samples were taken (biopsy). HOME CARE INSTRUCTIONS   Do not eat or drink anything for 2 hours after your procedure. Your nose and throat were numbed by medicine. If you try to eat or drink before the medicine wears off, food or drink could go into your lungs or you could burn yourself. After the numbness is gone and your cough and gag reflexes have returned, you may eat soft food and drink liquids slowly.   The day after the procedure, you can go back to your normal diet.   You may resume normal activities.   Keep all follow-up visits as directed by  your health care provider. It is important to keep all your appointments, especially if tissue samples were taken for testing (biopsy). SEEK IMMEDIATE MEDICAL CARE IF:   You have increasing shortness of breath.   You become light-headed or faint.   You have chest pain.   You have any new concerning symptoms.  You cough up more than a small amount of blood.  The amount of blood you cough up increases. MAKE SURE YOU:  Understand these instructions.  Will watch your condition.  Will get help right away if you are not doing well or get worse. Document Released: 06/24/2005 Document Revised: 04/21/2014 Document Reviewed: 08/09/2013 [facility] Patient Information 2015 [place], [place]. This information is not intended to replace advice given to you by your health care provider. Make sure you discuss any questions you have with your health care provider.   Please have someone to drive you home Please be careful with activities for next 24 hours You can eat 2-4 hours after getting home provided you are fully alert, able to cough, and are not nauseated or vomiting and     feel well You are expected to have low grade fever or cough some amount of blood for next 24-48 hours; if this worsens call us IF you are very short of breath or coughing blood or chest pain or not feeling well, call us [phone] anytime or go to emergency room Dr Chase Caller will call your son  with formal results within 72h  Future Appointments Date Time Provider [place]  09/09/2014 2:15 PM Tammy Jeralene Huff, NP [facility] None

## 2014-09-02 ENCOUNTER — Encounter (HOSPITAL_COMMUNITY): Payer: Self-pay | Admitting: Internal Medicine

## 2014-09-03 ENCOUNTER — Telehealth: Payer: Self-pay | Admitting: Internal Medicine

## 2014-09-03 DIAGNOSIS — C3490 Malignant neoplasm of unspecified part of unspecified bronchus or lung: Secondary | ICD-10-CM

## 2014-09-03 NOTE — Telephone Encounter (Signed)
Please advise PCC;s thanks 

## 2014-09-03 NOTE — Telephone Encounter (Signed)
EBUS shows small cell lung carcinoma per report.  This is for patient [name]. I called son Arlana Canizales and gave diagnosis. Likely extensive stage based on liver lesion in PET Scan but said she would need MRI. Deferred conversation on Rx and prognosis to oncology. He will meet with mom and oncology ASAP - I have done order. Note being sent to pulm  Triage and Norton Blizzard - thoracic navigator   Advised to keep appt with NP next week for palliative symptm needs of pain, sore throat - just to get started   Thanks  Dr. Brand Males, M.D., [facility].C.P Pulmonary and Critical Care Medicine Staff Physician [place] [facility] Pager: [phone], If no answer or between  15:00h - 7:00h: call [phone]  09/03/2014 4:22 PM

## 2014-09-03 NOTE — Telephone Encounter (Signed)
Just ensure [facility] processes the order and gets patient appt. You can have Daneil Dan do it

## 2014-09-03 NOTE — Telephone Encounter (Signed)
MR- please advise if there is something that pulm triage needs to do with this msg Thanks!

## 2014-09-04 ENCOUNTER — Telehealth: Payer: Self-pay | Admitting: *Deleted

## 2014-09-04 NOTE — Telephone Encounter (Signed)
Has appt to see dr Inda Merlin 09/08/14 Joellen Jersey

## 2014-09-04 NOTE — Telephone Encounter (Signed)
Called son again, changed appt time.  He verbalized understanding

## 2014-09-04 NOTE — Telephone Encounter (Signed)
Called patient's son with appt to see Dr. Julien Nordmann 09/11/14 arrival at 3:15.  He verbalized understanding of appt time and place

## 2014-09-08 ENCOUNTER — Other Ambulatory Visit (HOSPITAL_BASED_OUTPATIENT_CLINIC_OR_DEPARTMENT_OTHER): Payer: Medicare Other

## 2014-09-08 ENCOUNTER — Other Ambulatory Visit: Payer: Self-pay | Admitting: *Deleted

## 2014-09-08 ENCOUNTER — Encounter: Payer: Self-pay | Admitting: Internal Medicine

## 2014-09-08 ENCOUNTER — Ambulatory Visit (HOSPITAL_BASED_OUTPATIENT_CLINIC_OR_DEPARTMENT_OTHER): Payer: Medicare Other | Admitting: Internal Medicine

## 2014-09-08 ENCOUNTER — Other Ambulatory Visit: Payer: Self-pay | Admitting: Internal Medicine

## 2014-09-08 VITALS — BP 134/68 | HR 50 | Temp 97.7°F | Resp 17 | Ht 60.0 in | Wt 81.9 lb

## 2014-09-08 DIAGNOSIS — C3490 Malignant neoplasm of unspecified part of unspecified bronchus or lung: Secondary | ICD-10-CM

## 2014-09-08 DIAGNOSIS — C787 Secondary malignant neoplasm of liver and intrahepatic bile duct: Secondary | ICD-10-CM

## 2014-09-08 DIAGNOSIS — M549 Dorsalgia, unspecified: Secondary | ICD-10-CM

## 2014-09-08 DIAGNOSIS — R110 Nausea: Secondary | ICD-10-CM

## 2014-09-08 DIAGNOSIS — R599 Enlarged lymph nodes, unspecified: Secondary | ICD-10-CM

## 2014-09-08 DIAGNOSIS — Z801 Family history of malignant neoplasm of trachea, bronchus and lung: Secondary | ICD-10-CM

## 2014-09-08 DIAGNOSIS — G8929 Other chronic pain: Secondary | ICD-10-CM

## 2014-09-08 DIAGNOSIS — R911 Solitary pulmonary nodule: Secondary | ICD-10-CM

## 2014-09-08 DIAGNOSIS — C770 Secondary and unspecified malignant neoplasm of lymph nodes of head, face and neck: Secondary | ICD-10-CM

## 2014-09-08 DIAGNOSIS — M810 Age-related osteoporosis without current pathological fracture: Secondary | ICD-10-CM

## 2014-09-08 DIAGNOSIS — Z87891 Personal history of nicotine dependence: Secondary | ICD-10-CM

## 2014-09-08 LAB — COMPREHENSIVE METABOLIC PANEL (CC13)
ALBUMIN: 3.3 g/dL — AB (ref 3.5–5.0)
ALK PHOS: 112 U/L (ref 40–150)
ALT: 9 U/L (ref 0–55)
AST: 13 U/L (ref 5–34)
Anion Gap: 11 mEq/L (ref 3–11)
BUN: 13.7 mg/dL (ref 7.0–26.0)
CO2: 24 mEq/L (ref 22–29)
CREATININE: 0.7 mg/dL (ref 0.6–1.1)
Calcium: 9.6 mg/dL (ref 8.4–10.4)
Chloride: 103 mEq/L (ref 98–109)
Glucose: 97 mg/dl (ref 70–140)
POTASSIUM: 4.4 meq/L (ref 3.5–5.1)
Sodium: 138 mEq/L (ref 136–145)
Total Bilirubin: 0.34 mg/dL (ref 0.20–1.20)
Total Protein: 7.5 g/dL (ref 6.4–8.3)

## 2014-09-08 LAB — CBC WITH DIFFERENTIAL/PLATELET
BASO%: 1.2 % (ref 0.0–2.0)
BASOS ABS: 0.2 10*3/uL — AB (ref 0.0–0.1)
EOS ABS: 0.1 10*3/uL (ref 0.0–0.5)
EOS%: 0.7 % (ref 0.0–7.0)
HEMATOCRIT: 46.4 % (ref 34.8–46.6)
HGB: 14.6 g/dL (ref 11.6–15.9)
LYMPH%: 14.2 % (ref 14.0–49.7)
MCH: 28.7 pg (ref 25.1–34.0)
MCHC: 31.4 g/dL — ABNORMAL LOW (ref 31.5–36.0)
MCV: 91.5 fL (ref 79.5–101.0)
MONO#: 1 10*3/uL — ABNORMAL HIGH (ref 0.1–0.9)
MONO%: 8 % (ref 0.0–14.0)
NEUT%: 75.9 % (ref 38.4–76.8)
NEUTROS ABS: 9.8 10*3/uL — AB (ref 1.5–6.5)
Platelets: 436 10*3/uL — ABNORMAL HIGH (ref 145–400)
RBC: 5.07 10*6/uL (ref 3.70–5.45)
RDW: 13.9 % (ref 11.2–14.5)
WBC: 13 10*3/uL — ABNORMAL HIGH (ref 3.9–10.3)
lymph#: 1.8 10*3/uL (ref 0.9–3.3)

## 2014-09-08 MED ORDER — PROCHLORPERAZINE MALEATE 10 MG PO TABS: 10.0000 mg | ORAL_TABLET | Freq: Four times a day (QID) | ORAL | Status: DC | PRN

## 2014-09-08 NOTE — Progress Notes (Signed)
[place] [phone]   [phone]  CONSULT NOTE  REFERRING PHYSICIAN: Dr. Joaquin Courts  REASON FOR CONSULTATION:  80 years old white female recently diagnosed with metastatic lung cancer  HPI [name] is a 80 y.o. female was past medical history significant for hypertension, dyslipidemia, depression, COPD, hypothyroidism, hip fracture x3. The patient also has a long history of smoking. She was diagnosed in September 2011 with stage IIb (T3, N0, M0) non-small cell lung cancer, adenocarcinoma involving 2 lesions, one in the left upper lobe and the second in the left lower lobe. She underwent a stereotactic radiotherapy to these lesions under the care of Dr. Lisbeth Renshaw. She was followed by observation and repeat scan every 3-6 months for the first year that showed no evidence for disease recurrence. The patient was not seen since February of 2012.  She presented recently to the hospital complaining of worsening dyspnea. CT angiogram of the chest on an 18 2015 showed mediastinal and hilar adenopathy worrisome for recurrence of lung carcinoma versus lymphoproliferative disorder. There lung nodules bilaterally which are new and suspicious for bilateral lung metastasis. There was no evidence of acute pulmonary embolism. There was also abnormality of the anterior left second rib possibly post operative but metastatic involvement cannot be excluded. The patient was seen by Dr. Chase Caller and a PET scan was performed on 08/14/2014. It showed hypermetabolic mediastinal and left hilar lymphadenopathy. There was 1.0 CM hypermetabolic right supraclavicular lymph node demonstrated. This is suspicious for recurrent lung carcinoma. There was also 7 hypermetabolic lung nodules, largest and most hypermetabolic in the central left lower lobe measuring 2.0 CM and pulmonary metastasis cannot be excluded. There was also 1.0 CM hypermetabolic lesion in the anterior liver  dome suspicious for liver metastasis. On 09/01/2014 the patient underwent flexible bronchoscopy with endobronchial ultrasound and transbronchial needle aspiration of the station 7 subcarinal lymph node under the care of Dr. Chase Caller. The final cytology ([id number]) showed malignant cells present consistent with metastatic small cell carcinoma. There are malignant cells with high N/C ratio, inconspicuous nuclei and scanty cytoplasm. Associated tumor necrosis is also present. Immunostains were attempted on the cell blcok material and the tumor cells are strongly positive for CK AE1/AE3, synaptophysin, negative for chromogranin and CD45 with appropriate controls. The findings are consistent with metastaitc small cell carcinoma.  Dr. Chase Caller kindly referred the patient to me today for further evaluation and recommendation regarding treatment of her condition. When seen today the patient continues to complain of severe back pain that she had for years her most likely secondary to compression fractures in her back. She also has nausea and lack of appetite. She lost around 15 pounds over the last few months. She also complains of vertigo and occasional headache but no visual changes. She has some central chest pain with shortness of breath at baseline and increased with exertion and mild cough but no hemoptysis. Family history significant for a mother who was diagnosed with lung cancer and heart disease. The patient is a widow and has one son, [name] accompanied her to the clinic today. She used to work in [place]. She has a history of smoking one pack per day for around 65 years and quit 3 years ago. She has no history of alcohol or drug abuse.    HPI  Past Medical History  Diagnosis Date  . Hypertension   . Osteoporosis   . Diverticulitis   . Chronic back pain   . Fracture  midback 8 years ago, wears back brace  . COPD (chronic obstructive pulmonary disease)   . Asthma   . Hypothyroidism   .  Anxiety   . Headache(784.0)   . Nausea     last 3 weeks  . lung ca dx'd 08/2010    xrt comp 10/2010  . Lung cancer     LUL, LLL lung  . Fractures     left hip X 2,Right hip x1    Past Surgical History  Procedure Laterality Date  . Hip arthroplasty  10/10/2012    Procedure: ARTHROPLASTY BIPOLAR HIP;  Surgeon: Mauri Pole, MD;  Location: [facility];  Service: Orthopedics;  Laterality: Left;  . Orif periprosthetic fracture Left 12/03/2012    Procedure: OPEN REDUCTION INTERNAL FIXATION (ORIF) PERIPROSTHETIC FRACTURE;  Surgeon: Mauri Pole, MD;  Location: [facility];  Service: Orthopedics;  Laterality: Left;  . Hip arthroplasty Right 08/09/2013    Procedure: RIGHT HIP HEMI-ARTHROPLASTY;  Surgeon: Mauri Pole, MD;  Location: [facility];  Service: Orthopedics;  Laterality: Right;  . Abdominal hysterectomy  30 yrs ago    complete  . Endobronchial ultrasound Bilateral 09/01/2014    Procedure: ENDOBRONCHIAL ULTRASOUND;  Surgeon: Brand Males, MD;  Location: [facility];  Service: Cardiopulmonary;  Laterality: Bilateral;    Family History  Problem Relation Age of Onset  . Cancer Mother     lung  . Cancer Father     stomach    Social History History  Substance Use Topics  . Smoking status: Former Smoker -- 1.00 packs/day for 62 years    Types: Cigarettes    Quit date: 05/07/2013  . Smokeless tobacco: Never Used  . Alcohol Use: No    Allergies  Allergen Reactions  . Aspirin     REACTION: gi upset  . Augmentin [Amoxicillin-Pot Clavulanate] Nausea Only  . Erythromycin Other (See Comments)    Upset stomach   . Morphine And Related Other (See Comments)    Hallucinations  . Oxycodone Nausea And Vomiting  . Penicillins Nausea And Vomiting  . Ciprofloxacin Rash    Current Outpatient Prescriptions  Medication Sig Dispense Refill  . acidophilus (RISAQUAD) CAPS capsule Take 1 capsule by mouth every morning.       Marland Kitchen albuterol (PROVENTIL HFA;VENTOLIN HFA) 108 (90 BASE) MCG/ACT  inhaler Inhale 2 puffs into the lungs every 4 (four) hours as needed for wheezing or shortness of breath.  1 Inhaler  3  . ALPRAZolam (XANAX) 0.5 MG tablet Take 0.5 mg by mouth at bedtime as needed for anxiety.      Marland Kitchen amLODipine (NORVASC) 10 MG tablet Take 10 mg by mouth every morning.       . bisoprolol (ZEBETA) 5 MG tablet Take 5 mg by mouth every morning.       . budesonide-formoterol (SYMBICORT) 160-4.5 MCG/ACT inhaler Inhale 2 puffs into the lungs 2 (two) times daily.      . fentaNYL (DURAGESIC - DOSED MCG/HR) 50 MCG/HR Place 1 patch (50 mcg total) onto the skin every 3 (three) days.  5 patch  0  . fexofenadine (ALLEGRA) 180 MG tablet Take 180 mg by mouth as needed.       Marland Kitchen levothyroxine (SYNTHROID, LEVOTHROID) 50 MCG tablet Take 50 mcg by mouth daily before breakfast.      . ondansetron (ZOFRAN) 8 MG tablet Take by mouth every 8 (eight) hours as needed for nausea or vomiting.      . traMADol (ULTRAM) 50 MG tablet Take 1  tablet (50 mg total) by mouth every 6 (six) hours as needed for pain.  30 tablet  0  . traZODone (DESYREL) 50 MG tablet Take 50 mg by mouth at bedtime.      . prochlorperazine (COMPAZINE) 10 MG tablet Take 1 tablet (10 mg total) by mouth every 6 (six) hours as needed for nausea or vomiting.  60 tablet  0   No current facility-administered medications for this visit.    Review of Systems  Constitutional: positive for anorexia, fatigue and weight loss Eyes: negative Ears, nose, mouth, throat, and face: negative Respiratory: positive for cough, dyspnea on exertion, pleurisy/chest pain and wheezing Cardiovascular: negative Gastrointestinal: negative Genitourinary:negative Integument/breast: negative Hematologic/lymphatic: negative Musculoskeletal:positive for back pain and muscle weakness Neurological: positive for vertigo and weakness Behavioral/Psych: negative Endocrine: negative Allergic/Immunologic: negative  Physical Exam  ZWC:HENID, healthy, cachectic, ill  looking and malnourished SKIN: skin color, texture, turgor are normal, no rashes or significant lesions HEAD: Normocephalic, No masses, lesions, tenderness or abnormalities EYES: normal, PERRLA EARS: External ears normal, Canals clear OROPHARYNX:no exudate, no erythema and lips, buccal mucosa, and tongue normal  NECK: supple, no adenopathy, no JVD LYMPH:  no palpable lymphadenopathy, no hepatosplenomegaly BREAST:not examined LUNGS: expiratory wheezes bilaterally HEART: regular rate & rhythm and no murmurs ABDOMEN:abdomen soft, non-tender, normal bowel sounds and no masses or organomegaly BACK: Back symmetric, no curvature., No CVA tenderness EXTREMITIES:no joint deformities, effusion, or inflammation, no edema  NEURO: alert & oriented x 3 with fluent speech, no focal motor/sensory deficits  PERFORMANCE STATUS: ECOG 3  LABORATORY DATA: Lab Results  Component Value Date   WBC 13.0* 09/08/2014   HGB 14.6 09/08/2014   HCT 46.4 09/08/2014   MCV 91.5 09/08/2014   PLT 436* 09/08/2014      Chemistry      Component Value Date/Time   NA 138 09/08/2014 1117   NA 139 08/27/2014 0921   K 4.4 09/08/2014 1117   K 4.5 08/27/2014 0921   CL 105 08/27/2014 0921   CO2 24 09/08/2014 1117   CO2 27 08/27/2014 0921   BUN 13.7 09/08/2014 1117   BUN 12 08/27/2014 0921   CREATININE 0.7 09/08/2014 1117   CREATININE 0.6 08/27/2014 0921      Component Value Date/Time   CALCIUM 9.6 09/08/2014 1117   CALCIUM 9.1 08/27/2014 0921   ALKPHOS 112 09/08/2014 1117   ALKPHOS 110 12/02/2012 0506   AST 13 09/08/2014 1117   AST 11 12/02/2012 0506   ALT 9 09/08/2014 1117   ALT 7 12/02/2012 0506   BILITOT 0.34 09/08/2014 1117   BILITOT 0.2* 12/02/2012 0506       RADIOGRAPHIC STUDIES: Nm Pet Image Restag (ps) Skull Base To Thigh  08/14/2014   CLINICAL DATA:  Subsequent treatment strategy for lung carcinoma.  EXAM: NUCLEAR MEDICINE PET SKULL BASE TO THIGH  TECHNIQUE: 5.0 mCi F-18 FDG was injected intravenously. Full-ring PET  imaging was performed from the skull base to thigh after the radiotracer. CT data was obtained and used for attenuation correction and anatomic localization.  FASTING BLOOD GLUCOSE:  Value: 66 mg/dl  COMPARISON:  Chest CT on 08/05/2014 and 09/12/2012  FINDINGS: NECK  Beam hardening artifact noted from left shoulder prosthesis. Small approximately 1 cm hypermetabolic lymph node in the right supraclavicular region as SUV max of 7.2. No other hypermetabolic cervical lymph nodes identified.  CHEST  Diffuse mediastinal lymphadenopathy shows hypermetabolic activity, with index area in the right paratracheal region having SUV max of 9.6. Hypermetabolic adenopathy is  also seen in the left hilar region, with SUV max of 8.3. No hypermetabolic adenopathy identified within the right hilum.  A tiny sub-cm pulmonary nodule in the left upper lobe on image 64 has SUV max of 4.1. Another 2 cm pulmonary nodule in the central left lower lobe on image 80 has SUV max of 6.6. Pulmonary metastases suspected, although metachronous bronchogenic carcinoma cannot be excluded.  Hypermetabolic activity is seen at site of surgical clips in the left upper and lower lobes, where there is some associated parenchymal opacity although this is favored to represent postoperative scarring, recurrent carcinoma at these sites cannot definitely be excluded. Other tiny scattered sub-cm pulmonary nodules are seen bilaterally, some which have low-grade metabolic activity and are indeterminate.  ABDOMEN/PELVIS  A single small approximately 1 cm low-attenuation lesion in the anterior liver dome shows hypermetabolic activity with SUV max of 3.5. This is suspicious for a liver metastasis. No other definite hypermetabolic liver masses identified.  No abnormal hypermetabolic activity within the pancreas, adrenal glands, or spleen. No hypermetabolic lymph nodes in the abdomen or pelvis.  SKELETON  No focal hypermetabolic activity to suggest skeletal metastasis.  Bilateral hip prostheses noted.  IMPRESSION: Hypermetabolic mediastinal and left hilar lymphadenopathy. 1 cm hypermetabolic right supraclavicular lymph node also demonstrated. This is suspicious for recurrent lung carcinoma, with lymphoma consider less likely.  Several hypermetabolic lung nodules, largest and most hypermetabolic in the central left lower lobe measuring 2 cm. Pulmonary metastases cannot be excluded, with metachronous primary bronchogenic carcinoma also possible.  1 cm hypermetabolic lesion in the anterior liver dome, suspicious for liver metastasis. Consider abdomen MRI without and with contrast for further evaluation.   Electronically Signed   By: Earle Gell M.D.   On: 08/14/2014 11:48    ASSESSMENT: This is a very pleasant 80 years old white female with multiple medical problems including chronic back pain secondary to osteoporosis and compression fractures as well as history of stage IIb non-small cell lung cancer, adenocarcinoma diagnosed in 2011 and now presenting with extensive stage small cell lung cancer with massive mediastinal lymphadenopathy as well as bilateral pulmonary nodules as well as right supraclavicular and liver metastases. The patient has very good performance status.  PLAN: I had a lengthy discussion with the patient and her son today about her current disease stage, prognosis and treatment options. I recommended for the patient to complete the staging workup by ordering MRI of the brain to rule out brain metastases but the patient declined. I also discussed with the patient and her treatment options including palliative care and hospice referral versus consideration of systemic chemotherapy with reduced dose carboplatin and etoposide. The patient again declined chemotherapy. The patient and her son prefer to consider the palliative care and hospice at this point. I recommended for them to contact Dr. Maceo Pro, her primary care physician to arrange for the hospice  service as he has been taken care of the patient for several years and very familiar with her other medical conditions. For nausea, the patient is currently on Zofran but not controlling her nausea well. I added Compazine 10 mg by mouth every 6 hours as needed. She will continue on her current pain medication as prescribed by her orthopedic surgeon. She was advised to call if she has any concerning symptoms or if she changes her mind regarding the chemotherapy.  The patient voices understanding of current disease status and treatment options and is in agreement with the current care plan.  All questions were answered.  The patient knows to call the clinic with any problems, questions or concerns. We can certainly see the patient much sooner if necessary.  Thank you so much for allowing me to participate in the care of [place]. I will continue to follow up the patient with you and assist in her care.  I spent 40 minutes counseling the patient face to face. The total time spent in the appointment was 60 minutes.  Disclaimer: This note was dictated with voice recognition software. Similar sounding words can inadvertently be transcribed and may not be corrected upon review.   Dylann Gallier K. 09/08/2014, 12:40 PM

## 2014-09-09 ENCOUNTER — Encounter: Payer: Self-pay | Admitting: Adult Health

## 2014-09-09 ENCOUNTER — Inpatient Hospital Stay: Payer: Medicare Other | Admitting: Adult Health

## 2014-09-11 ENCOUNTER — Ambulatory Visit: Payer: Medicare Other | Admitting: Internal Medicine

## 2014-10-10 ENCOUNTER — Other Ambulatory Visit: Payer: Self-pay | Admitting: *Deleted

## 2014-10-10 DIAGNOSIS — C3490 Malignant neoplasm of unspecified part of unspecified bronchus or lung: Secondary | ICD-10-CM

## 2014-10-10 MED ORDER — PROCHLORPERAZINE MALEATE 10 MG PO TABS: 10.0000 mg | ORAL_TABLET | Freq: Four times a day (QID) | ORAL | Status: AC | PRN

## 2014-11-18 DEATH — deceased
